# Patient Record
Sex: Male | Born: 1950 | Race: Black or African American | Hispanic: No | Marital: Married | Smoking: Never smoker
Health system: Southern US, Community
[De-identification: ages and names within clinical notes are randomized; demographics above are authoritative.]

## PROBLEM LIST (undated history)

## (undated) DIAGNOSIS — Z86010 Personal history of colonic polyps: Principal | ICD-10-CM

## (undated) DIAGNOSIS — E119 Type 2 diabetes mellitus without complications: Secondary | ICD-10-CM

## (undated) DIAGNOSIS — E785 Hyperlipidemia, unspecified: Secondary | ICD-10-CM

## (undated) DIAGNOSIS — N529 Male erectile dysfunction, unspecified: Secondary | ICD-10-CM

## (undated) DIAGNOSIS — R7309 Other abnormal glucose: Secondary | ICD-10-CM

## (undated) DIAGNOSIS — I10 Essential (primary) hypertension: Secondary | ICD-10-CM

## (undated) HISTORY — DX: Hyperlipidemia, unspecified: E78.5

## (undated) HISTORY — DX: Other abnormal glucose: R73.09

## (undated) HISTORY — DX: Type 2 diabetes mellitus without complications: E11.9

## (undated) HISTORY — DX: Male erectile dysfunction, unspecified: N52.9

## (undated) HISTORY — DX: Essential (primary) hypertension: I10

## (undated) HISTORY — PX: COLONOSCOPY W/ POLYPECTOMY: SHX1380

## (undated) HISTORY — DX: Personal history of colonic polyps: Z86.010

---

## 2001-03-08 ENCOUNTER — Encounter: Payer: Self-pay | Admitting: Internal Medicine

## 2001-03-08 ENCOUNTER — Ambulatory Visit (HOSPITAL_COMMUNITY): Admission: RE | Admit: 2001-03-08 | Discharge: 2001-03-08 | Payer: Self-pay | Admitting: *Deleted

## 2001-03-08 ENCOUNTER — Encounter (INDEPENDENT_AMBULATORY_CARE_PROVIDER_SITE_OTHER): Payer: Self-pay | Admitting: Specialist

## 2001-03-08 DIAGNOSIS — Z860100 Personal history of colon polyps, unspecified: Secondary | ICD-10-CM

## 2001-03-08 HISTORY — DX: Personal history of colon polyps, unspecified: Z86.0100

## 2001-03-08 HISTORY — DX: Personal history of colonic polyps: Z86.010

## 2001-06-04 ENCOUNTER — Encounter: Admission: RE | Admit: 2001-06-04 | Discharge: 2001-06-04 | Payer: Self-pay | Admitting: *Deleted

## 2001-06-04 ENCOUNTER — Encounter: Payer: Self-pay | Admitting: *Deleted

## 2004-10-25 ENCOUNTER — Ambulatory Visit: Payer: Self-pay | Admitting: Internal Medicine

## 2004-11-08 ENCOUNTER — Encounter (INDEPENDENT_AMBULATORY_CARE_PROVIDER_SITE_OTHER): Payer: Self-pay | Admitting: *Deleted

## 2004-11-08 ENCOUNTER — Ambulatory Visit: Payer: Self-pay | Admitting: Internal Medicine

## 2005-01-04 ENCOUNTER — Ambulatory Visit: Payer: Self-pay | Admitting: Internal Medicine

## 2006-01-15 ENCOUNTER — Ambulatory Visit: Payer: Self-pay | Admitting: Internal Medicine

## 2006-01-17 ENCOUNTER — Ambulatory Visit: Payer: Self-pay | Admitting: Internal Medicine

## 2006-01-22 ENCOUNTER — Ambulatory Visit: Payer: Self-pay | Admitting: Internal Medicine

## 2006-01-24 ENCOUNTER — Ambulatory Visit: Payer: Self-pay | Admitting: Internal Medicine

## 2006-01-26 ENCOUNTER — Ambulatory Visit (HOSPITAL_COMMUNITY): Admission: RE | Admit: 2006-01-26 | Discharge: 2006-01-26 | Payer: Self-pay | Admitting: Internal Medicine

## 2006-02-28 ENCOUNTER — Ambulatory Visit: Payer: Self-pay | Admitting: Internal Medicine

## 2006-03-16 ENCOUNTER — Ambulatory Visit: Payer: Self-pay | Admitting: Internal Medicine

## 2006-07-05 ENCOUNTER — Ambulatory Visit: Payer: Self-pay | Admitting: Internal Medicine

## 2006-07-05 LAB — CONVERTED CEMR LAB
BUN: 9 mg/dL (ref 6–23)
CO2: 31 meq/L (ref 19–32)
Calcium: 9.3 mg/dL (ref 8.4–10.5)
Chloride: 104 meq/L (ref 96–112)
Creatinine, Ser: 1.3 mg/dL (ref 0.4–1.5)
Creatinine,U: 286.9 mg/dL
GFR calc Af Amer: 73 mL/min
GFR calc non Af Amer: 60 mL/min
Glucose, Bld: 85 mg/dL (ref 70–99)
Hgb A1c MFr Bld: 6.1 % — ABNORMAL HIGH (ref 4.6–6.0)
Microalb Creat Ratio: 2.1 mg/g (ref 0.0–30.0)
Microalb, Ur: 0.6 mg/dL (ref 0.0–1.9)
Potassium: 4.3 meq/L (ref 3.5–5.1)
Sodium: 141 meq/L (ref 135–145)

## 2006-10-19 ENCOUNTER — Encounter: Payer: Self-pay | Admitting: Internal Medicine

## 2007-04-17 ENCOUNTER — Encounter: Payer: Self-pay | Admitting: Internal Medicine

## 2007-11-07 ENCOUNTER — Encounter: Payer: Self-pay | Admitting: Internal Medicine

## 2007-11-29 ENCOUNTER — Telehealth: Payer: Self-pay | Admitting: Internal Medicine

## 2007-12-19 ENCOUNTER — Ambulatory Visit: Payer: Self-pay | Admitting: Internal Medicine

## 2007-12-19 DIAGNOSIS — I10 Essential (primary) hypertension: Secondary | ICD-10-CM

## 2007-12-19 DIAGNOSIS — N182 Chronic kidney disease, stage 2 (mild): Secondary | ICD-10-CM

## 2007-12-19 DIAGNOSIS — R7309 Other abnormal glucose: Secondary | ICD-10-CM

## 2007-12-26 ENCOUNTER — Ambulatory Visit: Payer: Self-pay | Admitting: Internal Medicine

## 2007-12-26 LAB — CONVERTED CEMR LAB
ALT: 24 units/L (ref 0–53)
AST: 23 units/L (ref 0–37)
Albumin: 3.9 g/dL (ref 3.5–5.2)
Alkaline Phosphatase: 53 units/L (ref 39–117)
BUN: 18 mg/dL (ref 6–23)
Basophils Absolute: 0 10*3/uL (ref 0.0–0.1)
Basophils Relative: 0.5 % (ref 0.0–3.0)
Bilirubin, Direct: 0.2 mg/dL (ref 0.0–0.3)
CO2: 31 meq/L (ref 19–32)
CRP, High Sensitivity: <1 — ABNORMAL LOW (ref 0.00–5.00)
Calcium: 9.2 mg/dL (ref 8.4–10.5)
Chloride: 105 meq/L (ref 96–112)
Cholesterol: 142 mg/dL (ref 0–200)
Creatinine, Ser: 1.4 mg/dL (ref 0.4–1.5)
Eosinophils Absolute: 0.2 10*3/uL (ref 0.0–0.7)
Eosinophils Relative: 2.5 % (ref 0.0–5.0)
GFR calc Af Amer: 67 mL/min
GFR calc non Af Amer: 55 mL/min
Glucose, Bld: 101 mg/dL — ABNORMAL HIGH (ref 70–99)
HCT: 40 % (ref 39.0–52.0)
HDL: 34.8 mg/dL — ABNORMAL LOW (ref >39.0)
Hemoglobin: 13.3 g/dL (ref 13.0–17.0)
Hgb A1c MFr Bld: 6.8 % — ABNORMAL HIGH (ref 4.6–6.0)
LDL Cholesterol: 84 mg/dL (ref 0–99)
Lymphocytes Relative: 44.2 % (ref 12.0–46.0)
MCHC: 33.1 g/dL (ref 30.0–36.0)
MCV: 74.8 fL — ABNORMAL LOW (ref 78.0–100.0)
Monocytes Absolute: 0.6 10*3/uL (ref 0.1–1.0)
Monocytes Relative: 7.6 % (ref 3.0–12.0)
Neutro Abs: 3.8 10*3/uL (ref 1.4–7.7)
Neutrophils Relative %: 45.2 % (ref 43.0–77.0)
PSA: 0.62 ng/mL (ref 0.10–4.00)
Platelets: 230 10*3/uL (ref 150–400)
Potassium: 4.3 meq/L (ref 3.5–5.1)
RBC: 5.34 M/uL (ref 4.22–5.81)
RDW: 13.7 % (ref 11.5–14.6)
Sodium: 139 meq/L (ref 135–145)
TSH: 0.95 microintl units/mL (ref 0.35–5.50)
Total Bilirubin: 1.2 mg/dL (ref 0.3–1.2)
Total CHOL/HDL Ratio: 4.1
Total Protein: 6.9 g/dL (ref 6.0–8.3)
Triglycerides: 117 mg/dL (ref 0–149)
VLDL: 23 mg/dL (ref 0–40)
WBC: 8.5 10*3/uL (ref 4.5–10.5)

## 2008-01-03 ENCOUNTER — Telehealth: Payer: Self-pay | Admitting: Internal Medicine

## 2008-04-22 ENCOUNTER — Telehealth: Payer: Self-pay | Admitting: Internal Medicine

## 2008-05-12 ENCOUNTER — Encounter: Payer: Self-pay | Admitting: Internal Medicine

## 2008-05-13 ENCOUNTER — Telehealth: Payer: Self-pay | Admitting: Internal Medicine

## 2008-08-24 ENCOUNTER — Telehealth: Payer: Self-pay | Admitting: Internal Medicine

## 2008-10-19 ENCOUNTER — Encounter: Payer: Self-pay | Admitting: Internal Medicine

## 2009-05-07 ENCOUNTER — Ambulatory Visit: Payer: Self-pay | Admitting: Internal Medicine

## 2009-05-10 ENCOUNTER — Encounter: Payer: Self-pay | Admitting: Internal Medicine

## 2009-05-21 ENCOUNTER — Encounter: Payer: Self-pay | Admitting: Internal Medicine

## 2009-06-18 ENCOUNTER — Ambulatory Visit: Payer: Self-pay | Admitting: Internal Medicine

## 2009-06-18 DIAGNOSIS — E785 Hyperlipidemia, unspecified: Secondary | ICD-10-CM

## 2009-07-19 ENCOUNTER — Telehealth: Payer: Self-pay | Admitting: Internal Medicine

## 2009-09-06 ENCOUNTER — Telehealth: Payer: Self-pay | Admitting: Internal Medicine

## 2009-10-15 ENCOUNTER — Encounter (INDEPENDENT_AMBULATORY_CARE_PROVIDER_SITE_OTHER): Payer: Self-pay | Admitting: *Deleted

## 2009-12-10 ENCOUNTER — Telehealth (INDEPENDENT_AMBULATORY_CARE_PROVIDER_SITE_OTHER): Payer: Self-pay | Admitting: *Deleted

## 2009-12-10 ENCOUNTER — Ambulatory Visit: Payer: Self-pay | Admitting: Internal Medicine

## 2009-12-10 LAB — CONVERTED CEMR LAB
ALT: 23 units/L (ref 0–53)
AST: 22 units/L (ref 0–37)
BUN: 15 mg/dL (ref 6–23)
CO2: 29 meq/L (ref 19–32)
Calcium: 9.5 mg/dL (ref 8.4–10.5)
Chloride: 109 meq/L (ref 96–112)
Cholesterol: 112 mg/dL (ref 0–200)
Creatinine, Ser: 1.4 mg/dL (ref 0.4–1.5)
GFR calc non Af Amer: 69.07 mL/min (ref >60)
Glucose, Bld: 94 mg/dL (ref 70–99)
HDL: 44.6 mg/dL (ref >39.00)
LDL Cholesterol: 60 mg/dL (ref 0–99)
PSA: 0.55 ng/mL (ref 0.10–4.00)
Potassium: 5.1 meq/L (ref 3.5–5.1)
Sodium: 144 meq/L (ref 135–145)
Total CHOL/HDL Ratio: 3
Triglycerides: 37 mg/dL (ref 0.0–149.0)
VLDL: 7.4 mg/dL (ref 0.0–40.0)

## 2009-12-17 ENCOUNTER — Ambulatory Visit: Payer: Self-pay | Admitting: Internal Medicine

## 2009-12-17 ENCOUNTER — Telehealth: Payer: Self-pay | Admitting: Internal Medicine

## 2009-12-28 ENCOUNTER — Encounter: Payer: Self-pay | Admitting: Internal Medicine

## 2010-01-11 ENCOUNTER — Telehealth: Payer: Self-pay | Admitting: Internal Medicine

## 2010-02-11 ENCOUNTER — Encounter (INDEPENDENT_AMBULATORY_CARE_PROVIDER_SITE_OTHER): Payer: Self-pay | Admitting: *Deleted

## 2010-02-11 ENCOUNTER — Ambulatory Visit: Payer: Self-pay | Admitting: Internal Medicine

## 2010-02-25 ENCOUNTER — Encounter: Payer: Self-pay | Admitting: Internal Medicine

## 2010-03-01 ENCOUNTER — Telehealth: Payer: Self-pay | Admitting: Internal Medicine

## 2010-03-01 ENCOUNTER — Encounter: Payer: Self-pay | Admitting: Internal Medicine

## 2010-03-04 ENCOUNTER — Ambulatory Visit: Payer: Self-pay | Admitting: Internal Medicine

## 2010-03-08 ENCOUNTER — Encounter: Payer: Self-pay | Admitting: Internal Medicine

## 2010-05-29 LAB — CONVERTED CEMR LAB
ALT: 25 units/L (ref 0–53)
AST: 24 units/L (ref 0–37)
Albumin: 4.7 g/dL (ref 3.5–5.2)
Alkaline Phosphatase: 62 units/L (ref 39–117)
BUN: 13 mg/dL (ref 6–23)
Bilirubin, Direct: 0.1 mg/dL (ref 0.0–0.3)
CO2: 23 meq/L (ref 19–32)
Calcium: 9.6 mg/dL (ref 8.4–10.5)
Chloride: 101 meq/L (ref 96–112)
Cholesterol: 164 mg/dL (ref 0–200)
Creatinine, Ser: 1.31 mg/dL (ref 0.40–1.50)
Creatinine, Ser: 1.45 mg/dL
Creatinine, Urine: 308.5 mg/dL
Glucose, Bld: 112 mg/dL — ABNORMAL HIGH (ref 70–99)
HDL: 36 mg/dL — ABNORMAL LOW (ref >39)
Hgb A1c MFr Bld: 6.2 % — ABNORMAL HIGH (ref 4.6–6.1)
Indirect Bilirubin: 0.8 mg/dL (ref 0.0–0.9)
LDL Cholesterol: 102 mg/dL — ABNORMAL HIGH (ref 0–99)
Microalb Creat Ratio: 3 mg/g (ref 0.0–30.0)
Microalb, Ur: 0.94 mg/dL (ref 0.00–1.89)
Potassium: 4.8 meq/L (ref 3.5–5.3)
Sodium: 140 meq/L (ref 135–145)
TSH: 0.426 microintl units/mL (ref 0.350–4.500)
Total Bilirubin: 0.9 mg/dL (ref 0.3–1.2)
Total CHOL/HDL Ratio: 4.6
Total Protein: 7.6 g/dL (ref 6.0–8.3)
Triglycerides: 128 mg/dL (ref <150)
VLDL: 26 mg/dL (ref 0–40)

## 2010-05-31 NOTE — Procedures (Signed)
Summary: Colonoscopy: 1 adenoma, repeat in 5 yrs  Patient: David Cannon Note: All result statuses are Final unless otherwise noted.  Tests: (1) Colonoscopy (COL)   COL Colonoscopy           DONE     [facility]     [address]     [place], [place]  [address]           COLONOSCOPY PROCEDURE REPORT           PATIENT:  David Cannon, David Cannon  MR#:  [id number]     BIRTHDATE:  1950-12-29, 59 yrs. old  GENDER:  male     ENDOSCOPIST:  Iva Boop, MD, [facility]     REF. BY:  Thomos Lemons, DO     PROCEDURE DATE:  03/04/2010     PROCEDURE:  Colonoscopy with biopsy     ASA CLASS:  Class II     INDICATIONS:  surveillance and high-risk screening, history of     pre-cancerous (adenomatous) colon polyps index exam 2002: 5 cm     pedunculated tubulovillous adenoma     2003 and 2006, no adenomas     MEDICATIONS:   Fentanyl 75 mcg IV, Versed 6 mg           DESCRIPTION OF PROCEDURE:   After the risks benefits and     alternatives of the procedure were thoroughly explained, informed     consent was obtained.  Digital rectal exam was performed and     revealed no abnormalities and normal prostate.   The LB 180AL     [id number] endoscope was introduced through the anus and advanced to     the cecum, which was identified by both the appendix and ileocecal     valve, without limitations.  The quality of the prep was     excellent, using MoviPrep.  The instrument was then slowly     withdrawn as the colon was fully examined.     Insertion: 2:44 minutes Withdrawal: 11:09 minutes     <<PROCEDUREIMAGES>>           FINDINGS:  Two polyps were found. They were diminutive.     Appendiceal orifice and hepatic flexure. The polyps were removed     using cold biopsy forceps.  This was otherwise a normal     examination of the colon.   Retroflexed views in the right colon     and rectum revealed no abnormalities.    The scope was then     withdrawn from the patient and the procedure completed.        COMPLICATIONS:  None     ENDOSCOPIC IMPRESSION:     1) Two diminutive (<69mm)  polyps removed     2) Otherwise normal examination with excellent prep     3) Personal history of 5 cm tubulovillous adenoma removal 2002           REPEAT EXAM:  In for Colonoscopy, pending biopsy results.           Iva Boop, MD, [facility]           CC:  Thomos Lemons, DO     The Patient           n.     eSIGNED:   Iva Boop at 03/04/2010 08:37 AM           Marisa Hua, [phone]  Note: An exclamation mark (!) indicates a result that was  not dispersed into the flowsheet. Document Creation Date: 03/04/2010 8:37 AM _______________________________________________________________________  (1) Order result status: Final Collection or observation date-time: 03/04/2010 08:29 Requested date-time:  Receipt date-time:  Reported date-time:  Referring Physician:   Ordering Physician: Stan Head [phone]) Specimen Source:  Source: Launa Grill Order Number: [id number] Lab site:   Appended Document: Colonoscopy   Colonoscopy  Procedure date:  03/04/2010  Findings:          1) Two diminutive (<4mm)  polyps removed     2) Otherwise normal examination with excellent prep     3) Personal history of 5 cm tubulovillous adenoma removal 2002   1. Colon, polyp(s), appendix colon, hepatic flexure :  - TUBULAR ADENOMA (ONE FRAGMENT). - HYPERPLASTIC POLYP AND POLYPOID FRAGMENT OF BENIGN COLONIC MUCOSA). - NO HIGH GRADE DYSPLASIA OR MALIGNANCY.  Comments:      Repeat colonoscopy in 5 years.   Procedures Next Due Date:    Colonoscopy: 03/2015   Appended Document: Colonoscopy: 1 adenoma, repeat in 5 yrs     Procedures Next Due Date:    Colonoscopy: 03/2015

## 2010-05-31 NOTE — Miscellaneous (Signed)
Summary: Moviprep Rx  Clinical Lists Changes  Medications: Added new medication of MOVIPREP 100 GM  SOLR (PEG-KCL-NACL-NASULF-NA ASC-C) As per prep instructions. - Signed Rx of MOVIPREP 100 GM  SOLR (PEG-KCL-NACL-NASULF-NA ASC-C) As per prep instructions.;  #1 x 0;  Signed;  Entered by: Jennye Boroughs RN;  Authorized by: Iva Boop MD, FACG;  Method used: [facility]. [phone]*, [address], [place], [place]  [address], Ph: [phone] or [phone], Fax: [phone]    Prescriptions: MOVIPREP 100 GM  SOLR (PEG-KCL-NACL-NASULF-NA ASC-C) As per prep instructions.  #1 x 0   Entered by:   Jennye Boroughs RN   Authorized by:   Iva Boop MD, [facility]   Signed by:   Jennye Boroughs RN on 03/01/2010   Method used:   [facility] to        [facility]. [phone]* (retail)       [address]       [place], [place]  [address]       Ph: [phone] or [phone]       Fax: [phone]   RxID:   [id number]

## 2010-05-31 NOTE — Procedures (Signed)
Summary: Colonoscopy: Hyperplastic Polyp   Colonoscopy  Procedure date:  11/08/2004  Findings:      Comments: TWO TINY SIGMOID POLYPS REMOVED  ***MICROSCOPIC EXAMINATION AND DIAGNOSIS***  COLON, POLYP(S): HYPERPLASTIC POLYP(S). NO ADENOMATOUS CHANGE OR MALIGNANCY IDENTIFIED. (BIOPSIES, SIGMOID)  Procedures Next Due Date:    Colonoscopy: 10/2009  Patient Name: David Cannon, David Cannon. MRN:  Procedure Procedures: Colonoscopy CPT: [phone].  Personnel: Endoscopist: Iva Boop, MD, [facility].  Referred By: Henrine Screws, MD.  Exam Location: Exam performed in Outpatient Clinic. Outpatient  Patient Consent: Procedure, Alternatives, Risks and Benefits discussed, consent obtained, from patient. Consent was obtained by the RN.  Indications  Surveillance of: Adenomatous Polyp(s). This is an initial surveillance exam. Initial polypectomy was performed in 2002. in Nov. 1-2 Polyps were found at Index Exam. Largest polyp removed was > 19 mm. Pathology of worst  polyp: tubulovillous adenoma.  Increased Risk Screening: For family history of colorectal neoplasia, in  grandparent Family History of Polyps.  Comments: Mother either had colon polyps or cancer History  Current Medications: Patient is not currently taking Coumadin.  Allergies: No known allergies.  Pre-Exam Physical: Performed Nov 08, 2004. Cardio-pulmonary exam, Rectal exam, HEENT exam , Abdominal exam, Mental status exam WNL.  Exam Exam: Extent of exam reached: Cecum, extent intended: Cecum.  The cecum was identified by appendiceal orifice and IC valve. Patient position: on left side. Colon retroflexion performed. Images taken. ASA Classification: I. Tolerance: excellent.  Monitoring: Pulse and BP monitoring, Oximetry used. Supplemental O2 given.  Colon Prep Used MiraLax for colon prep. Prep results: good.  Sedation Meds: Patient assessed and found to be appropriate for moderate (conscious) sedation. Fentanyl 50  mcg. given IV. Versed 6 mg. given IV.  Findings - NORMAL EXAM: Cecum to Sigmoid Colon.  MULTIPLE POLYPS: Sigmoid Colon. minimum size 2 mm, maximum size 3 mm. Procedure:  biopsy without cautery, removed, Polyp retrieved, 2 polyps Polyps sent to pathology. 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  NORMAL EXAM: Rectum.   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   Comments: TWO TINY SIGMOID POLYPS REMOVED Events  Unplanned Interventions: No intervention was required.  Plans Patient Education: Patient given standard instructions for: Polyps.  Disposition: After procedure patient sent to recovery. After recovery patient sent home.  Scheduling/Referral: Colonoscopy, to Iva Boop, MD, [facility], 5 YEARS DUE TO POLYPS AND HISTORY,  Primary Care Provider, to Henrine Screws, MD, AS PLANNED,   CC:   Henrine Screws, MD  This report was created from the original endoscopy report, which was reviewed and signed by the above listed endoscopist.

## 2010-05-31 NOTE — Letter (Signed)
Summary: [facility]  [facility]   Imported By: Lanelle Bal 06/07/2009 12:50:27  _____________________________________________________________________  External Attachment:    Type:   Image     Comment:   External Document

## 2010-05-31 NOTE — Progress Notes (Signed)
Summary: Medication Refill  Phone Note Call from Patient Call back at Work Phone [phone]   Caller: Patient Summary of Call: patient called and left voice message requesting refill on his blood pressure medication. His message did not clarify which medication needed to be filled Initial call taken by: Glendell Docker CMA,  Sep 06, 2009 5:37 PM  Follow-up for Phone Call        Left message on voicemail for pt. to call with name of med to be filled.  Mervin Kung CMA  Sep 07, 2009 10:28 AM   Pt returned my call and verified need for refill on Benicar HCT. Refills sent to pharmacy.  Nicki Guadalajara Fergerson CMA  Sep 07, 2009 12:54 PM     Prescriptions: BENICAR HCT 20-12.5 MG TABS (OLMESARTAN MEDOXOMIL-HCTZ) one by mouth once daily  #90 x 0   Entered by:   Mervin Kung CMA   Authorized by:   D. Thomos Lemons DO   Signed by:   Mervin Kung CMA on 09/07/2009   Method used:   [facility]        [facility]. [phone]* (retail)       [address]       [place], [place]  [address]       Ph: [phone] or [phone]       Fax: [phone]   RxID:   [id number]

## 2010-05-31 NOTE — Letter (Signed)
Summary: [facility] Instructions  [facility]  [address] [place], [place] [address]   Phone: [phone]  Fax: [phone]       LOKI WUTHRICH    [birth year]    MRN: [id number]      Procedure Day Dorna Bloom: Farrell Ours, 03/04/10     Arrival Time: 7:30 AM     Procedure Time: 8:00 AM    Location of Procedure:                    _X_  [facility] (4th Floor)  PREPARATION FOR COLONOSCOPY WITH MOVIPREP   Starting 5 days prior to your procedure 02/28/10 do not eat nuts, seeds, popcorn, corn, beans, peas,  salads, or any raw vegetables.  Do not take any fiber supplements (e.g. Metamucil, Citrucel, and Benefiber).  THE DAY BEFORE YOUR PROCEDURE         THURSDAY, 03/03/10  1.  Drink clear liquids the entire day-NO SOLID FOOD  2.  Do not drink anything colored red or purple.  Avoid juices with pulp.  No orange juice.  3.  Drink at least 64 oz. (8 glasses) of fluid/clear liquids during the day to prevent dehydration and help the prep work efficiently.  CLEAR LIQUIDS INCLUDE: Water Jello Ice Popsicles Tea (sugar ok, no milk/cream) Powdered fruit flavored drinks Coffee (sugar ok, no milk/cream) Gatorade Juice: apple, white grape, white cranberry  Lemonade Clear bullion, consomm, broth Carbonated beverages (any kind) Strained chicken noodle soup Hard Candy                           4.  In the morning, mix first dose of MoviPrep solution:    Empty 1 Pouch A and 1 Pouch B into the disposable container    Add lukewarm drinking water to the top line of the container. Mix to dissolve    Refrigerate (mixed solution should be used within 24 hrs)  5.  Begin drinking the prep at 5:00 p.m. The MoviPrep container is divided by 4 marks.   Every 15 minutes drink the solution down to the next mark (approximately 8 oz) until the full liter is complete.   6.  Follow completed prep with 16 oz of clear liquid of your choice (Nothing red or purple).  Continue to drink clear  liquids.  7.  Mix second dose of MoviPrep solution:    Empty 1 Pouch A and 1 Pouch B into the disposable container    Add lukewarm drinking water to the top line of the container. Mix to dissolve    Refrigerate  Beginning at 9:00 p.m.         1. Every 15 minutes, drink the solution down to the next mark (approx 8 oz) until the full liter is complete.         2. Follow completed prep with 16 oz. of clear liquid of your choice.    THE DAY OF YOUR PROCEDURE      FRIDAY, 03/04/10  1. You may drink clear liquids until 6:00 AM (2 HOURS BEFORE PROCEDURE).  MEDICATION INSTRUCTIONS  Unless otherwise instructed, you should take regular prescription medications with a small sip of water   as early as possible the morning of your procedure.  Diabetic patients - see separate instructions.       OTHER INSTRUCTIONS  You will need a responsible adult at least 62 years of age to accompany you and drive you home.  This person must remain in the waiting room during your procedure.  Wear loose fitting clothing that is easily removed.  Leave jewelry and other valuables at home.  However, you may wish to bring a book to read or  an iPod/MP3 player to listen to music as you wait for your procedure to start.  Remove all body piercing jewelry and leave at home.  Total time from sign-in until discharge is approximately 2-3 hours.  You should go home directly after your procedure and rest.  You can resume normal activities the  day after your procedure.  The day of your procedure you should not:   Drive   Make legal decisions   Operate machinery   Drink alcohol   Return to work  You will receive specific instructions about eating, activities and medications before you leave.  The above instructions have been reviewed and explained to me by   Francee Piccolo, CMA (AAMA)    I fully understand and can verbalize these instructions _____________________________ Date 02/11/10

## 2010-05-31 NOTE — Letter (Signed)
   [place] at [facility] [address] [place], [place]  [address]  [place] Phone: [phone]      May 10, 2009   PENNY FRISBIE [address] [place], [place] [address]  RE:  LAB RESULTS  Dear  Mr. Jaskot,  The following is an interpretation of your most recent lab tests.  Please take note of any instructions provided or changes to medications that have resulted from your lab work.  ELECTROLYTES:  Good - no changes needed  KIDNEY FUNCTION TESTS:  Good - no changes needed  LIVER FUNCTION TESTS:  Good - no changes needed  LIPID PANEL:  Stable - no changes needed Triglyceride: 128   Cholesterol: 164   LDL: 102   HDL: 36   Chol/HDL%:  4.6 Ratio  THYROID STUDIES:  Thyroid studies normal TSH: 0.426     DIABETIC STUDIES:  Good - no changes needed Blood Glucose: 112   HgbA1C: 6.2   Microalbumin/Creatinine Ratio: 3.0          Sincerely Yours,    Dr. Thomos Lemons

## 2010-05-31 NOTE — Progress Notes (Signed)
Summary: prep   Phone Note Call from Patient Call back at Home Phone [phone]   Caller: mother, Corrie Dandy Call For: Dr. Leone Payor Reason for Call: Talk to Nurse Summary of Call: prep not at pharmacy... [facility] [address] Initial call taken by: Vallarie Mare,  March 01, 2010 11:37 AM  Follow-up for Phone Call        [facility] [address]  Phoned pts. mother and she is aware. Follow-up by: Jennye Boroughs RN,  March 01, 2010 12:17 PM

## 2010-05-31 NOTE — Miscellaneous (Signed)
Summary: Eye Exam  Clinical Lists Changes  Observations: Added new observation of DMEYEEXAMNXT: 09/2009 (12/28/2009 8:31) Added new observation of DMEYEEXMRES: normal (10/13/2008 8:46) Added new observation of EYE EXAM BY: [facility] [phone] (10/13/2008 8:46) Added new observation of DIAB EYE EX: normal (10/13/2008 8:46)       Diabetes Management Exam:    Eye Exam:       Eye Exam done elsewhere          Date: 10/13/2008          Results: normal          Done by: [facility] [phone]

## 2010-05-31 NOTE — Letter (Signed)
Summary: Diabetic Instructions  [facility]  [address] [place], [place] [address]   Phone: [phone]  Fax: [phone]    DELMORE SEAR Feb 27, 1951 MRN: [id number]   _x_   ORAL DIABETIC MEDICATION INSTRUCTIONS  The day before your procedure:   Take your diabetic pill as you do normally  The day of your procedure:   Do not take your diabetic pill    We will check your blood sugar levels during the admission process and again in Recovery before discharging you home  ________________________________________________________________________

## 2010-05-31 NOTE — Progress Notes (Signed)
  Phone Note Other Incoming   Request: Send information Summary of Call: Request for records received from [facility]. Request forwarded to [facility].     

## 2010-05-31 NOTE — Assessment & Plan Note (Signed)
Summary: RECEIVED RECALL LETTER..LSW.    History of Present Illness Visit Type: Initial Visit Primary GI MD: Stan Head MD [facility] Primary Provider: Dondra Spry DO Chief Complaint: colonoscopy screening History of Present Illness:   60 yo African-American man with hx of adenomatous colon polyps.    GI Review of Systems      Denies abdominal pain, acid reflux, belching, bloating, chest pain, dysphagia with liquids, dysphagia with solids, heartburn, loss of appetite, nausea, vomiting, vomiting blood, weight loss, and  weight gain.        Denies anal fissure, black tarry stools, change in bowel habit, constipation, diarrhea, diverticulosis, fecal incontinence, heme positive stool, hemorrhoids, irritable bowel syndrome, jaundice, light color stool, liver problems, rectal bleeding, and  rectal pain. Preventive Screening-Counseling & Management      Drug Use:  no.      Clinical Reports Reviewed:  Colonoscopy:  11/08/2004:  Comments: TWO TINY SIGMOID POLYPS REMOVED  ***MICROSCOPIC EXAMINATION AND DIAGNOSIS***  COLON, POLYP(S): HYPERPLASTIC POLYP(S). NO ADENOMATOUS CHANGE OR MALIGNANCY IDENTIFIED. (BIOPSIES, SIGMOID)  05/24/2001:  Done  03/08/2001:  POLYP: Ascending Colon, Maximum size: 50 mm. pedunculated polyp.  ***MICROSCOPIC EXAMINATION AND DIAGNOSIS***    COLON, ASCENDING POLYP: TUBULOVILLOUS ADENOMA. STALK MARGIN NOT INVOLVED BY ADENOMA. NO HIGH GRADE DYSPLASIA OR MALIGNANCY IDENTIFIED.   Current Medications (verified): 1)  Benicar Hct 20-12.5 Mg Tabs (Olmesartan Medoxomil-Hctz) .... One By Mouth Once Daily 2)  Viagra 100 Mg Tabs (Sildenafil Citrate) .... 1/2-1 Tablet By Mouth As Needed 3)  Actos 30 Mg Tabs (Pioglitazone Hcl) .... One By Mouth Once Daily 4)  Accu-Chek Aviva  Strp (Glucose Blood) .... Use Once Daily 5)  Accu-Chek Multiclix Lancets  Misc (Lancets) .... Use As Directed 6)  Simvastatin 10 Mg Tabs (Simvastatin) .... One By Mouth Qpm 7)  Zostavax 84166  Unt/0.70ml Solr (Zoster Vaccine Live) .... Administer Vaccine X 1 8)  Aspirin 81 Mg Tbec (Aspirin) .... One By Mouth Once Daily  Allergies (verified): No Known Drug Allergies  Past History:  Past Medical History: Reviewed history from 12/17/2009 and no changes required. CRI Hypertension  Diabetes Mellitus II    Past Surgical History: Reviewed history from 06/18/2009 and no changes required. colonoscopy 11/08/04 - two tiny sigmoid polyps (hyperplastic) colonoscopy 2002 - tubulovillious adenoma     Family History: Prostate ca - F (died age 64) Lung ca - M CAD - no  Colon ca - MGF     Social History: Reviewed history from 12/17/2009 and no changes required. Occupation:  Radiographer, therapeutic - call center Married 2 children Never Smoked   Alcohol use-no (social)   Illicit Drug Use - no Drug Use:  no  Vital Signs:  Patient profile:   60 year old male Height:      73.5 inches Weight:      218 pounds BMI:     28.47 Pulse rate:   72 / minute Pulse rhythm:   regular BP sitting:   110 / 74  (left arm)  Vitals Entered By: Milford Cage NCMA (February 11, 2010 1:58 PM)  Physical Exam  General:  Well developed, well nourished, no acute distress. Lungs:  Clear throughout to auscultation. Heart:  Regular rate and rhythm; no murmurs, rubs,  or bruits.   Impression & Recommendations:  Problem # 1:  PERSONAL HX COLONIC POLYPS (ICD-V12.72) Assessment Unchanged 5 cm TV adenoma 2002 (Santogade) no adenomas 2006 due for surveillance/screening Orders: Colonoscopy (Colon)  Problem # 2:  SCREENING COLORECTAL-CANCER (ICD-V76.51) Assessment: Unchanged  Orders: Colonoscopy (  Colon)  Patient Instructions: 1)  Please pick up your medications at your pharmacy.  2)  We will see you at your procedure on 03/04/10. 3)  [facility] Patient Information Guide given to patient.  4)  Colonoscopy and Flexible Sigmoidoscopy brochure given.  5)  The medication list was reviewed and  reconciled.  All changed / newly prescribed medications were explained.  A complete medication list was provided to the patient / caregiver. Prescriptions: MOVIPREP 100 GM  SOLR (PEG-KCL-NACL-NASULF-NA ASC-C) As per prep instructions.  #1 x 0   Entered by:   Francee Piccolo CMA (AAMA)   Authorized by:   Iva Boop MD, [facility]   Signed by:   Francee Piccolo CMA (AAMA) on 02/11/2010   Method used:   [facility] [phone]* (retail)       [address]       [place], [place]  [address]       Ph: [phone] or [phone]       Fax: [phone]   RxID:   [id number]

## 2010-05-31 NOTE — Letter (Signed)
Summary: [facility]  [facility]   Imported By: Lanelle Bal 03/18/2010 11:13:31  _____________________________________________________________________  External Attachment:    Type:   Image     Comment:   External Document

## 2010-05-31 NOTE — Procedures (Signed)
Summary: Colonoscopy: Santogade: Adenoma   Colonoscopy  Procedure date:  03/08/2001  Findings:      POLYP: Ascending Colon, Maximum size: 50 mm. pedunculated polyp.  ***MICROSCOPIC EXAMINATION AND DIAGNOSIS***    COLON, ASCENDING POLYP: TUBULOVILLOUS ADENOMA. STALK MARGIN NOT INVOLVED BY ADENOMA. NO HIGH GRADE DYSPLASIA OR MALIGNANCY IDENTIFIED.  Patient Name: David Cannon, David Cannon. MRN:  Procedure Procedures: Colonoscopy CPT: [phone].    with polypectomy. CPT: [id number].  Personnel: Endoscopist: Roosvelt Harps, MD.  Referred By: Henrine Screws, MD.  Exam Location: Exam performed in Endoscopy Suite. Outpatient  Patient Consent: Procedure, Alternatives, Risks and Benefits discussed, consent obtained, from patient. Consent to be contacted was not given.  Indications  Evaluation of: Positive fecal occult blood test per home screening.  Surveillance of: Adenomatous Polyp(s).  History Allergies: No known allergies.  Patient Habits Patient does not smoke. Drinking Status: not currently drinking.  Pre-Exam Physical: Cardio-pulmonary exam, Rectal exam, HEENT exam , Abdominal exam, Extremity exam, Neurological exam, Mental status exam WNL.  Exam Exam: Extent of exam reached: Ascending Colon, extent intended: Cecum.  Exam incomplete due to tortuosity. Patient position: left side to back. Colon retroflexion not performed. Images taken. ASA Classification: I. Tolerance: good.  Monitoring: Pulse and BP monitoring, Oximetry used. Supplemental O2 given.  Colon Prep Used Phospho Soda for colon prep. Prep results: excellent.  Fluoroscopy: Fluoroscopy was not used.  Sedation Meds: Patient assessed and found to be appropriate for moderate (conscious) sedation. Sedation was managed by the Endoscopist. Demerol 80 mg. Versed 8 mg.  Findings IMAGE TAKEN: Descending Colon.  Image #1 attached.  Comments:  Normal.  POLYP: Ascending Colon, Maximum size: 50 mm. pedunculated  polyp. Procedure:  snare with cautery, removed, retrieved, Polyp sent to pathology. ICD9: Colon Polyps: 211.3. Comments: Image #2 & 4: pre and post removal.  - Injection: Ascending Colon. for bleeding post polypectomy. Injected with Epinephrine 1:10000, 10 ccs. Outcome: successful.  - Bicap/Coagulation: Ascending Colon. Outcome: successful.   Assessment Abnormal examination, see findings above.  Diagnoses: 211.3: Colon Polyps.   Events  Unplanned Interventions: Bleeding site was cauterized.  Unplanned Events: The patient had the following complications:  Bleeding.  Plans  Post Exam Instructions: Post sedation instructions given.  Medication Plan: Referring provider to order medications.  Patient Education: Patient given standard instructions for: Polyps.  Disposition: After procedure patient sent to recovery. After recovery patient sent home.  Scheduling/Referral: Clinic Visit, to Roosvelt Harps, MD, around Mar 13, 2001.   CC:   Henrine Screws, MD  This report was created from the original endoscopy report, which was reviewed and signed by the above listed endoscopist.

## 2010-05-31 NOTE — Progress Notes (Signed)
Summary: diet questions  Phone Note Call from Patient   Caller: Spouse Call For: D. Thomos Lemons DO Summary of Call: Pt's wife called concerned about the diet that her husband has been placed on. She states he was told to cut out starches, avoid sugar and salt. She states that pt has lost 15 pounds. She has been following the diet along with her husband and has lost 15 pounds as well. Her physician recently advised her not to follow this diet.  She feels if she is not following this diet then the pt may try to eat what she brings home. Is this diet ok for pt. to continue? Can he have some "white foods"?   Should he see a nutritionist?   Please advise.  Nicki Guadalajara Fergerson CMA Duncan Dull)  January 11, 2010 3:17 PM   Follow-up for Phone Call        pt was advised to follow low carb diet , not no carb diet.   I suggest pt limit carb intake to 25 grams per meal.  Follow-up by: D. Thomos Lemons DO,  January 11, 2010 6:17 PM  Additional Follow-up for Phone Call Additional follow up Details #1::        Advised pt's wife per Dr Olegario Messier instruction and she voice understanding. Nicki Guadalajara Fergerson CMA Duncan Dull)  January 12, 2010 9:07 AM

## 2010-05-31 NOTE — Assessment & Plan Note (Signed)
Summary: 6 MONTH FOLLOW UP/MHF   Vital Signs:  Patient profile:   60 year old male Weight:      214.75 pounds BMI:     29.23 O2 Sat:      98 % on Room air Temp:     98.4 degrees F oral Pulse rate:   59 / minute Pulse rhythm:   regular Resp:     16 per minute BP sitting:   100 / 70  (right arm) Cuff size:   large  Vitals Entered By: Glendell Docker CMA (December 17, 2009 9:23 AM)  O2 Flow:  Room air CC: 6 month followup , Type 2 diabetes mellitus follow-up Is Patient Diabetic? Yes Pain Assessment Patient in pain? no      Comments low blood sugar 89 high 116 avg 100, no concerns,questions about insurance policy   Primary Care Provider:  D. Thomos Lemons DO  CC:  6 month followup  and Type 2 diabetes mellitus follow-up.  History of Present Illness:  Type 2 Diabetes Mellitus Follow-Up      This is a 60 year old man who presents for Type 2 diabetes mellitus follow-up.  The patient denies weight gain.  The patient denies the following symptoms: chest pain.  Since the last visit the patient reports good dietary compliance and compliance with medications.  exercises occ saw optometrist in Jan or Feb.  no diabetic retionopathy  hyperlipidemia - no side effects from simvastatin.  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    Physical Exam  General:  alert, well-developed, and well-nourished.   Neck:  No deformities, masses, or tenderness noted.no  carotid bruits.   Lungs:  normal respiratory effort and normal breath sounds.   Heart:  normal rate, regular rhythm, and no gallop.   Extremities:  No lower extremity edema Neurologic:  cranial nerves II-XII intact and gait normal.     Impression & Recommendations:  Problem # 1:  HYPERLIPIDEMIA, MILD (ICD-272.4) Assessment Improved  His updated medication list for this problem includes:    Simvastatin 10 Mg Tabs (Simvastatin) ..... One by mouth qpm  Labs Reviewed: SGOT: 22 (12/10/2009)   SGPT: 23 (12/10/2009)   HDL:44.60 (12/10/2009), 36 (05/07/2009)  LDL:60 (12/10/2009), 102 (96/07/5407)  Chol:112 (12/10/2009), 164 (05/07/2009)  Trig:37.0 (12/10/2009), 128 (05/07/2009)  Problem # 2:  DIABETES MELLITUS, TYPE II (ICD-250.00) Assessment: Unchanged  His updated medication list for this problem includes:    Benicar Hct 20-12.5 Mg Tabs (Olmesartan medoxomil-hctz) ..... One by mouth once daily    Actos 30 Mg Tabs (Pioglitazone hcl) ..... One by mouth once daily    Aspirin 81 Mg Tbec (Aspirin) ..... One by mouth once daily  Labs Reviewed: Creat: 1.4 (12/10/2009)    Reviewed HgBA1c results: 6.2 (05/07/2009)  6.8 (12/26/2007)  Problem # 3:  HYPERTENSION (ICD-401.9) Assessment: Unchanged  His updated medication list for this problem includes:  Benicar Hct 20-12.5 Mg Tabs (Olmesartan medoxomil-hctz) ..... One by mouth once daily  BP today: 100/70 Prior BP: 110/70 (06/18/2009)  Labs Reviewed: K+: 5.1 (12/10/2009) Creat: : 1.4 (12/10/2009)   Chol: 112 (12/10/2009)   HDL: 44.60 (12/10/2009)   LDL: 60 (12/10/2009)   TG: 37.0 (12/10/2009)  Complete Medication List: 1)  Benicar Hct 20-12.5 Mg Tabs (Olmesartan medoxomil-hctz) .... One by mouth once daily 2)  Viagra 100 Mg Tabs (Sildenafil citrate) .... 1/2-1 tablet by mouth as needed 3)  Actos 30 Mg Tabs (Pioglitazone hcl) .... One by mouth once daily 4)  Accu-chek Aviva Strp (Glucose blood) .... Use once daily 5)  Accu-chek  Multiclix Lancets Misc (Lancets) .... Use as directed 6)  Simvastatin 10 Mg Tabs (Simvastatin) .... One by mouth qpm 7)  Zostavax 57846 Unt/0.52ml Solr (Zoster vaccine live) .... Administer vaccine x 1 8)  Aspirin 81 Mg Tbec (Aspirin) .... One by mouth once daily  Patient Instructions: 1)  Please schedule a follow-up appointment in 6 months. 2)  Monitor your blood pressure at home. 3)  If SBP < 100, take 1/2 of Benicar/Hctz 4)  BMP prior to visit, ICD-9:  401.9 5)  HbgA1C prior to visit, ICD-9:  250.00 6)  Urine Microalbumin prior to visit, ICD-9: 250.00 7)  Please return for lab work one (1) week before your next appointment.  Prescriptions: ACCU-CHEK MULTICLIX LANCETS  MISC (LANCETS) use as directed  #100 x 1   Entered and Authorized by:   D. Thomos Lemons DO   Signed by:   D. Thomos Lemons DO on 12/17/2009   Method used:   Electronically to        [facility] [phone]* (retail)       [address]       [place], [place]  [address]       Ph: [phone] or [phone]       Fax: [phone]   RxID:   [id number] ACCU-CHEK AVIVA  STRP (GLUCOSE BLOOD) use once daily  #100 x 1   Entered and Authorized by:   D. Thomos Lemons DO   Signed by:   D. Thomos Lemons DO on 12/17/2009   Method used:   Electronically to        [facility] [phone]* (retail)       [address], [place]  [address]       Ph: [phone] or [phone]       Fax: [phone]   RxID:   [id number] SIMVASTATIN 10 MG TABS (SIMVASTATIN) one by mouth qpm  #90 x 1   Entered and Authorized by:   D. Thomos Lemons DO   Signed by:   D. Thomos Lemons DO on 12/17/2009   Method used:   Electronically to        [facility] [phone]* (retail)       [address] [place]  [address]       Ph: [phone] or [phone]       Fax: [phone]   RxID:   [id number] ACTOS 30 MG TABS (PIOGLITAZONE HCL) one by mouth once daily  #90 x 1   Entered and Authorized by:   D. Thomos Lemons DO   Signed by:    D. Thomos Lemons DO on 12/17/2009   Method used:   [facility]        [facility] [place] [phone][facility] (retail)       [phone] [place]  [address]       Ph: [phone] or [phone]       Fax: [phone]   RxID:   [id number] BENICAR HCT 20-12.5 MG TABS (OLMESARTAN MEDOXOMIL-HCTZ) one by mouth once daily  #90 x 1   Entered and Authorized by:   D. Thomos Lemons DO   Signed by:   D. Thomos Lemons DO on 12/17/2009   Method used:   [facility]        [facility] [phone]* (retail)       [address]       [place], [place]  [address]       Ph: [phone] or [phone]       Fax: [phone]   RxID:   [id number]   Current Allergies (reviewed today): No known allergies

## 2010-05-31 NOTE — Letter (Signed)
Summary: Colonoscopy Letter  [facility]  [address] [place], [place] [address]   Phone: [phone]  Fax: [phone]      October 15, 2009 MRN: [id number]   David Cannon [address] [place], [place]  [address]   Dear Mr. Channing,   According to your medical record, it is time for you to schedule a Colonoscopy. The American Cancer Society recommends this procedure as a method to detect early colon cancer. Patients with a family history of colon cancer, or a personal history of colon polyps or inflammatory bowel disease are at increased risk.  This letter has beeen generated based on the recommendations made at the time of your procedure. If you feel that in your particular situation this may no longer apply, please contact our office.  Please call our office at [phone] to schedule this appointment or to update your records at your earliest convenience.  Thank you for cooperating with [place] to provide you with the very best care possible.   Sincerely,    Iva Boop, M.D.  [facility] [phone]

## 2010-05-31 NOTE — Assessment & Plan Note (Signed)
Summary: David Cannon   Vital Signs:  Patient profile:   60 year old male Height:      72 inches Weight:      226.50 pounds BMI:     30.83 O2 Sat:      98 % on Room air Temp:     98.6 degrees F oral Pulse rate:   68 / minute Pulse rhythm:   regular Resp:     16 per minute BP sitting:   97 / 60  (right arm) Cuff size:   large  Vitals Entered By: Glendell Docker CMA (May 07, 2009 8:37 AM)  O2 Flow:  Room air  Primary Care Provider:  D. Thomos Lemons DO  CC:  CPX.  History of Present Illness: CPX  60 y/o male with hx of htn and chronic renal insuff for routine CPX.  no significant interval hx.  He has not been taking actos on regular basis.    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    Physical Exam  General:  alert, well-developed, and well-nourished.   Head:  normocephalic and atraumatic.   Ears:  R ear normal and L ear normal.   Mouth:  Oral mucosa and oropharynx without lesions or exudates.  Teeth in good repair. Neck:  No deformities, masses, or tenderness noted.no carotid bruits.     Lungs:  normal respiratory effort, normal breath sounds, no crackles, and no wheezes.   Heart:  normal rate, regular rhythm, no murmur, and no gallop.   Abdomen:  soft, non-tender, normal bowel sounds, no masses, no hepatomegaly, and no splenomegaly.   Extremities:  No lower extremity edema  Neurologic:  cranial nerves II-XII intact and gait normal.   Psych:  normally interactive, good eye contact, not anxious appearing, and not depressed appearing.    Diabetes Management Exam:    Foot Exam (with socks and/or shoes not present):       Inspection:          Left foot: normal          Right foot: normal   Impression & Recommendations:  Problem # 1:  HEALTH MAINTENANCE EXAM (ICD-V70.0)  Reviewed adult health maintenance protocols.  Colonoscopy: Done (05/24/2001) Td Booster: Tdap (12/19/2007)   Flu Vax: given (03/18/2007)   Chol: 142 (12/26/2007)   HDL: 34.8 (12/26/2007)   LDL: 84 (12/26/2007)   TG: 117 (12/26/2007) TSH: 0.95 (12/26/2007)   HgbA1C: 6.8 (12/26/2007)   PSA: 0.62 (12/26/2007)  Orders: EKG w/ Interpretation (93[address])  Problem # 2:  HYPERTENSION (ICD-401.9) Change avalide to Benicar.  Cr follwed by  nephrologist.  His updated medication list for this problem includes:    Benicar Hct 20-12.5 Mg Tabs (Olmesartan medoxomil-hctz) ..... One by mouth once daily  Orders: T-TSH ([address])  BP today: 97/60 Prior BP: 124/78 (12/19/2007)  Labs Reviewed: K+: 4.3 (12/26/2007) Creat: : 1.4 (12/26/2007)   Chol: 142 (12/26/2007)   HDL: 34.8 (12/26/2007)   LDL: 84 (12/26/2007)   TG: 117 (12/26/2007)  Problem # 3:  DIABETES MELLITUS, TYPE II, BORDERLINE (ICD-790.29) restart Actos.  The following medications were removed from the medication list:    Actos 15 Mg Tabs (Pioglitazone hcl) .Marland Kitchen... Take 1 tablet by mouth once a day His updated medication list for this problem includes:    Actos 30 Mg Tabs (Pioglitazone hcl) ..... 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  last Cr.   1.45  05/12/08  Complete Medication List: 1)  Benicar Hct 20-12.5 Mg Tabs (Olmesartan medoxomil-hctz) .... One by mouth once daily 2)  Viagra 100 Mg Tabs (Sildenafil citrate) .... 1/2-1 tablet by mouth as needed 3)  Actos 30 Mg Tabs (Pioglitazone hcl) .... One by mouth once daily 4)  Accu-chek Aviva Strp (Glucose blood) .... Use once daily 5)  Accu-chek Multiclix Lancets Misc (Lancets) .... Use as directed  Other Orders: T-Basic Metabolic Panel [id number]) T-Lipid Profile [id number]) T-Hepatic Function [phone]) T- Hemoglobin A1C ([address]-24401) T-Urine Microalbumin w/creat. ratio [id number]) Influenza Vaccine NON MCR (42595) Admin 1st Vaccine ([address])  Patient Instructions: 1)  Please schedule a follow-up appointment in 3 months. Prescriptions: ACCU-CHEK MULTICLIX LANCETS  MISC (LANCETS) use as directed  #100 x 1   Entered and Authorized by:   D. Thomos Lemons DO   Signed by:   D. Thomos Lemons DO on 05/07/2009   Method used:   Print then Give to Patient   RxID:   [id number] ACCU-CHEK AVIVA  STRP (GLUCOSE BLOOD) use once daily  #100 x 1   Entered and Authorized by:   D. Thomos Lemons DO   Signed by:   D. Thomos Lemons DO on 05/07/2009   Method used:   Print then Give to Patient   RxID:   [id number] ACTOS 30 MG TABS (PIOGLITAZONE HCL) one by mouth once daily  #90 x 1   Entered and Authorized by:   D. Thomos Lemons DO   Signed by:   D. Thomos Lemons DO on 05/07/2009   Method used:   Print then Give to Patient   RxID:   [id number] BENICAR HCT 20-12.5 MG TABS (OLMESARTAN MEDOXOMIL-HCTZ) one by mouth once daily  #90 x 1   Entered and Authorized by:   D. Thomos Lemons DO   Signed by:   D. Thomos Lemons DO on 05/07/2009   Method used:   Print then Give to Patient   RxID:   [id number]    Immunizations Administered:  Influenza Vaccine # 1:    Vaccine Type: Fluvax  Non-MCR    Site: left deltoid    Mfr: GlaxoSmithKline    Dose: 0.5 ml    Route: IM    Given by: Glendell Docker CMA    Exp. Date: 10/28/2009    Lot #: [id number]    VIS given: 12/08/2008  Flu Vaccine Consent Questions:    Do you have a history of severe allergic reactions to this vaccine? no    Any prior history of allergic reactions to egg and/or gelatin? no  Do you have a sensitivity to the preservative Thimersol? no    Do you have a past history of Guillan-Barre Syndrome? no    Do you currently have an acute febrile illness? no    Have you ever had a severe reaction to latex? no    Vaccine information given and explained to patient? yes     Current Allergies (reviewed today): No known allergies

## 2010-05-31 NOTE — Assessment & Plan Note (Signed)
Summary: 1 MONTH FOLLOW UP/[facility] WITH PT /MHF   Vital Signs:  Patient profile:   60 year old male Weight:      218.25 pounds BMI:     29.71 O2 Sat:      99 % on Room air Temp:     98.1 degrees F oral Pulse rate:   68 / minute Pulse rhythm:   regular BP sitting:   110 / 70  (right arm) Cuff size:   large  Vitals Entered By: Glendell Docker CMA (June 18, 2009 8:44 AM)  O2 Flow:  Room air  Primary Care Provider:  D. Thomos Lemons DO  CC:  1 Month Follow up and Type 2 diabetes mellitus follow-up.  History of Present Illness: 1 month folllow up  Type 2 Diabetes Mellitus Follow-Up      This is a 60 year old man who presents for Type 2 diabetes mellitus follow-up.  The patient denies weight gain.  The patient denies the following symptoms: chest pain.  Since the last visit the patient reports good dietary compliance and monitoring blood glucose.  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   Neck:  No deformities, masses, or tenderness noted.no carotid bruits.   Lungs:  normal respiratory effort, normal breath sounds, no crackles, and no wheezes.   Heart:  normal rate, regular rhythm, no murmur, and no gallop.   Extremities:  trace left pedal edema and trace right pedal edema.     Impression & Recommendations:  Problem # 1:  DIABETES MELLITUS, TYPE II (ICD-250.00) Assessment Improved wife has made dietary adjustments for pt.   He is  tolerating actos.  he has mild LE swelling.  Maintain current medication regimen.  His updated medication list for this problem includes:    Benicar Hct 20-12.5 Mg Tabs (Olmesartan medoxomil-hctz) ..... One by mouth once daily    Actos 30 Mg Tabs (Pioglitazone hcl) ..... One by mouth once daily    Aspirin 81 Mg Tbec (Aspirin) ..... One by mouth once daily  Problem # 2:  HYPERTENSION (ICD-401.9) well controlled.  Maintain current medication regimen.  His updated medication list for this problem includes:    Benicar Hct 20-12.5 Mg Tabs (Olmesartan medoxomil-hctz) ..... One by mouth once daily  BP today: 110/70 Prior BP: 97/60 (05/07/2009)  Labs Reviewed: K+: 4.8 (05/07/2009) Creat: : 1.31 (05/07/2009)   Chol: 164 (05/07/2009)   HDL: 36 (05/07/2009)   LDL: 102 (05/07/2009)   TG: 128 (05/07/2009)  Problem # 3:  HYPERLIPIDEMIA, MILD (ICD-272.4) We discussed higher risk of CV disease in diabetics.  start statin and low dose asa His updated medication list for this problem includes:    Simvastatin 10 Mg Tabs (Simvastatin) ..... One by mouth qpm  Labs Reviewed: SGOT: 24 (05/07/2009)   SGPT: 25 (05/07/2009)   HDL:36 (05/07/2009), 34.8 (12/26/2007)  LDL:102 (05/07/2009), 84 (23/55/7322)  Chol:164 (05/07/2009), 142 (12/26/2007)  Trig:128 (05/07/2009), 117 (12/26/2007)  Complete Medication List: 1)  Benicar Hct 20-12.5 Mg Tabs (Olmesartan medoxomil-hctz) .... One by mouth once daily 2)  Viagra 100 Mg Tabs (Sildenafil citrate) .... 1/2-1 tablet by mouth as needed 3)  Actos 30 Mg Tabs (Pioglitazone hcl) .... One by mouth once daily 4)  Accu-chek Aviva Strp (Glucose blood) .... Use once daily 5)  Accu-chek Multiclix Lancets Misc (Lancets) .... Use as directed 6)  Simvastatin 10 Mg Tabs (Simvastatin) .... One by mouth qpm 7)  Zostavax [address] Unt/0.9ml Solr (Zoster vaccine live) .... Administer vaccine x 1 8)  Aspirin 81 Mg Tbec (Aspirin) .... One by mouth once daily  Patient Instructions: 1)   Please schedule a follow-up appointment in 6 months. 2)  BMP prior to visit, ICD-9: 401.9 3)  AST, ALT, FLP prior to visit, ICD-9: 272.4 4)  Please return for lab work one (1) week before your next appointment.  Prescriptions: ZOSTAVAX 60454 UNT/0.65ML SOLR (ZOSTER VACCINE LIVE) administer vaccine x 1  #1 x 0   Entered and Authorized by:   D. Thomos Lemons DO   Signed by:   D. Thomos Lemons DO on 06/18/2009   Method used:   Print then Give to Patient   RxID:   [id number] SIMVASTATIN 10 MG TABS (SIMVASTATIN) one by mouth qpm  #30 x 5   Entered and Authorized by:   D. Thomos Lemons DO   Signed by:   D. Thomos Lemons DO on 06/18/2009   Method used:   Electronically to        [facility] [address] (retail)       [phone]       [place], [place]  [address]       Ph: [phone]       Fax: [phone]   RxID:   [id number]   Current Allergies (reviewed today): No known allergies

## 2010-05-31 NOTE — Letter (Signed)
Summary: New Patient letter  [facility]  [address] [place], [place] [address]   Phone: [phone]  Fax: [phone]       12/28/2009 MRN: [id number]  [facility] [address] [place], [place]  [address]  Dear Mr. Neyens,  Welcome to the Gastroenterology Division at [facility].    You are scheduled to see Dr.  Leone Payor on February 11, 2010 at 1:45 p.m.  on the 3rd floor at [facility], [address]  We ask that you try to arrive at our office 15 minutes prior to your appointment time to allow for check-in.  We would like you to complete the enclosed self-administered evaluation form prior to your visit and bring it with you on the day of your appointment.  We will review it with you.  Also, please bring a complete list of all your medications or, if you prefer, bring the medication bottles and we will list them.  Please bring your insurance card so that we may make a copy of it.  If your insurance requires a referral to see a specialist, please bring your referral form from your primary care physician.  Co-payments are due at the time of your visit and may be paid by cash, check or credit card.     Your office visit will consist of a consult with your physician (includes a physical exam), any laboratory testing he/she may order, scheduling of any necessary diagnostic testing (e.g. x-ray, ultrasound, CT-scan), and scheduling of a procedure (e.g. Endoscopy, Colonoscopy) if required.  Please allow enough time on your schedule to allow for any/all of these possibilities.    If you cannot keep your appointment, please call [phone] to cancel or reschedule prior to your appointment date.  This allows [place] the opportunity to schedule an appointment for another patient in need of care.  If you do not cancel or reschedule by 5 p.m. the business day prior to your appointment date, you will be charged a $50.00 late cancellation/no-show fee.    Thank you for  choosing [facility] for your medical needs.  We appreciate the opportunity to care for you.  Please visit [place] at our website  to learn more about our practice.                     Sincerely,                                                             The Gastroenterology Division

## 2010-05-31 NOTE — Progress Notes (Signed)
Summary: Insurance Forms  Phone Note Call from Patient Call back at Work Phone [phone]   Caller: Patient Summary of Call: Pt would like to pick up insurance forms from prior visit today Initial call taken by: Lannette Donath,  December 17, 2009 2:06 PM  Follow-up for Phone Call        call was returne to patient, he was not available. Wife Corrie Dandy was informed [employer] forms were not in Dr Waynette Buttery office. She was provided with medical records number to check for paper. She was also provided with the fax number to our office. She was advised if Medical records does not have paperwork to contact [employer] and have them fax the forms to office to my attention.  Follow-up by: Glendell Docker CMA,  December 21, 2009 9:49 AM

## 2010-05-31 NOTE — Letter (Signed)
Summary: [facility]  [facility]   Imported By: Lanelle Bal 01/07/2010 11:05:48  _____________________________________________________________________  External Attachment:    Type:   Image     Comment:   External Document

## 2010-05-31 NOTE — Miscellaneous (Signed)
Summary: Diabetic Eye Exam  Clinical Lists Changes  Observations: Added new observation of DMEYEEXAMNXT: 03/2011 (03/08/2010 12:07) Added new observation of DMEYEEXMRES: normal (02/25/2010 12:09) Added new observation of EYE EXAM BY: Dr Heather [place] (02/25/2010 12:09) Added new observation of DIAB EYE EX: normal (02/25/2010 12:09)       Diabetes Management Exam:    Eye Exam:       Eye Exam done elsewhere          Date: 02/25/2010          Results: normal          Done by: Dr Heather Burundi

## 2010-05-31 NOTE — Letter (Signed)
Summary: [facility]  [facility]   Imported By: Lanelle Bal 01/07/2010 11:04:45  _____________________________________________________________________  External Attachment:    Type:   Image     Comment:   External Document

## 2010-05-31 NOTE — Letter (Signed)
Summary: [facility]  [facility]   Imported By: Lanelle Bal 01/07/2010 11:06:23  _____________________________________________________________________  External Attachment:    Type:   Image     Comment:   External Document

## 2010-05-31 NOTE — Letter (Signed)
Summary: Patient Notice- Polyp Results  [facility]  [address] [place], [place] [address]   Phone: [phone]  Fax: [phone]        March 08, 2010 MRN: [id number]    David Cannon [address] [place], [place]  [address]    Dear Mr. Sluder,  One of the polyps removed from your colon was adenomatous. This means that it was pre-cancerous or that  it had the potential to change into cancer over time. the other was not pre-cancerous.  I recommend that you have a repeat colonoscopy in 5 years to determine if you have developed any new polyps over time and to screen for colorectal cancer. If you develop any new rectal bleeding, abdominal pain or significant bowel habit changes, please contact us before then.  In addition to repeating colonoscopy, changing health habits may reduce your risk of having more colon polyps and possibly, colon cancer. You may lower your risk of future polyps and colon cancer by adopting healthy habits such as not smoking or using tobacco (if you do), being physically active, losing weight (if overweight), and eating a diet which includes fruits and vegetables and limits red meat.  Please call us if you are having persistent problems or have questions about your condition that have not been fully answered at this time.   Sincerely,  Iva Boop MD, [facility]  This letter has been electronically signed by your physician.  Appended Document: Patient Notice- Polyp Results letter mailed

## 2010-05-31 NOTE — Progress Notes (Signed)
Summary: Viagra Refill  Phone Note Refill Request Call back at Work Phone [phone] Message from:  Patient on July 19, 2009 4:53 PM  Refills Requested: Medication #1:  VIAGRA 100 MG TABS 1/2-1 tablet by mouth as needed   Dosage confirmed as above?Dosage Confirmed   Supply Requested: 3 months [facility] [address] .... Call pt. as soon as this is completed [phone] he is requesting a 90 day supply  Initial call taken by: Michaelle Copas,  July 19, 2009 4:54 PM  Follow-up for Phone Call        Rx completed in Dr. Tiajuana Amass Follow-up by: Glendell Docker CMA,  July 20, 2009 8:19 AM    Prescriptions: VIAGRA 100 MG TABS (SILDENAFIL CITRATE) 1/2-1 tablet by mouth as needed  #21 x 3   Entered by:   Glendell Docker CMA   Authorized by:   D. Thomos Lemons DO   Signed by:   Glendell Docker CMA on 07/20/2009   Method used:   [facility] to        [facility] [phone]* (retail)       [address]       [place], [place]  [address]       Ph: [phone] or [phone]       Fax: [phone]   RxID:   [id number]

## 2010-06-01 ENCOUNTER — Encounter: Payer: Self-pay | Admitting: Internal Medicine

## 2010-06-22 NOTE — Letter (Signed)
Summary: [facility]  [facility]   Imported By: Maryln Gottron 06/17/2010 10:28:47  _____________________________________________________________________  External Attachment:    Type:   Image     Comment:   External Document

## 2010-07-12 LAB — GLUCOSE, CAPILLARY

## 2010-08-02 ENCOUNTER — Telehealth: Payer: Self-pay | Admitting: *Deleted

## 2010-08-02 MED ORDER — TADALAFIL 10 MG PO TABS: 10.0000 mg | ORAL_TABLET | ORAL | Status: DC | PRN

## 2010-08-02 NOTE — Telephone Encounter (Signed)
Patient called and left voice message wanting to know if he could change from Viagra to Cialis.

## 2010-08-02 NOTE — Telephone Encounter (Signed)
Call placed to patient at [phone], no answer. A detailed voice message was left informing patient per Dr Artist Pais instructions

## 2010-08-02 NOTE — Telephone Encounter (Signed)
See rx for cialis.   Please have pt call if any problems with new medication

## 2010-08-03 ENCOUNTER — Telehealth: Payer: Self-pay | Admitting: Internal Medicine

## 2010-08-03 DIAGNOSIS — E119 Type 2 diabetes mellitus without complications: Secondary | ICD-10-CM

## 2010-08-03 MED ORDER — PIOGLITAZONE HCL 30 MG PO TABS: 30.0000 mg | ORAL_TABLET | Freq: Every day | ORAL | Status: DC

## 2010-08-03 NOTE — Telephone Encounter (Signed)
Refill- actos 30mg  tablet. Take 1 tablet by mouth every day. Qty 90. Last fill 12.27.11.

## 2010-08-03 NOTE — Telephone Encounter (Signed)
Actos refill sent to pharmacy 

## 2010-08-04 ENCOUNTER — Other Ambulatory Visit: Payer: Self-pay | Admitting: *Deleted

## 2010-08-04 DIAGNOSIS — E785 Hyperlipidemia, unspecified: Secondary | ICD-10-CM

## 2010-08-04 NOTE — Telephone Encounter (Signed)
Patient called and left voice message stating he is in need of refill for Simvastatin. His message states that he has a 7 day supply remaining

## 2010-08-12 ENCOUNTER — Telehealth: Payer: Self-pay | Admitting: Internal Medicine

## 2010-08-12 DIAGNOSIS — E785 Hyperlipidemia, unspecified: Secondary | ICD-10-CM

## 2010-08-12 MED ORDER — SIMVASTATIN 10 MG PO TABS: 10.0000 mg | ORAL_TABLET | Freq: Every day | ORAL | Status: DC

## 2010-08-12 NOTE — Telephone Encounter (Signed)
Rx refill for Simvastatin sent to pharmacy 

## 2010-08-12 NOTE — Telephone Encounter (Signed)
Refill- simvastatin 10mg  tablet. Take 1 tablet by mouth every evening. Qty 30.   Per pharmacy--patient completely out of above medication. Patient unsure if he is still to continue.

## 2010-10-17 NOTE — Telephone Encounter (Signed)
Medication refilled on 08/12/2010. Patient will need office visit prior to additional refills

## 2010-11-18 ENCOUNTER — Telehealth: Payer: Self-pay | Admitting: *Deleted

## 2010-11-18 MED ORDER — TADALAFIL 10 MG PO TABS: 10.0000 mg | ORAL_TABLET | ORAL | Status: DC | PRN

## 2010-11-18 NOTE — Telephone Encounter (Signed)
Received message from pt requesting a 90 day supply of Cialis. Pt last seen 12/07/09 and advised f/u in 6 months. Pt has no future appts on file. Please advise.

## 2010-11-18 NOTE — Telephone Encounter (Signed)
Can dispense #6 only. Is diabetic and hyperlipidemic not seen for soon time. Needs appt

## 2010-11-18 NOTE — Telephone Encounter (Signed)
Pt notified and scheduled f/u for 11/25/10 at 4pm. Refill sent to pharmacy as below.

## 2010-11-22 ENCOUNTER — Encounter: Payer: Self-pay | Admitting: Internal Medicine

## 2010-11-25 ENCOUNTER — Encounter: Payer: Self-pay | Admitting: Internal Medicine

## 2010-11-25 ENCOUNTER — Ambulatory Visit (INDEPENDENT_AMBULATORY_CARE_PROVIDER_SITE_OTHER): Payer: BC Managed Care – PPO | Admitting: Internal Medicine

## 2010-11-25 DIAGNOSIS — Q828 Other specified congenital malformations of skin: Secondary | ICD-10-CM

## 2010-11-25 DIAGNOSIS — E119 Type 2 diabetes mellitus without complications: Secondary | ICD-10-CM

## 2010-11-25 DIAGNOSIS — E785 Hyperlipidemia, unspecified: Secondary | ICD-10-CM

## 2010-11-25 DIAGNOSIS — M79609 Pain in unspecified limb: Secondary | ICD-10-CM

## 2010-11-25 DIAGNOSIS — R599 Enlarged lymph nodes, unspecified: Secondary | ICD-10-CM

## 2010-11-25 DIAGNOSIS — M79646 Pain in unspecified finger(s): Secondary | ICD-10-CM

## 2010-11-25 MED ORDER — CEPHALEXIN 500 MG PO CAPS: 500.0000 mg | ORAL_CAPSULE | Freq: Three times a day (TID) | ORAL | Status: AC

## 2010-11-25 NOTE — Patient Instructions (Signed)
Please schedule chem7, a1c, urine microalbumin 250.0 and lipid/lft 272.4 prior to next visit 

## 2010-11-27 DIAGNOSIS — Q828 Other specified congenital malformations of skin: Secondary | ICD-10-CM | POA: Insufficient documentation

## 2010-11-27 DIAGNOSIS — M79646 Pain in unspecified finger(s): Secondary | ICD-10-CM | POA: Insufficient documentation

## 2010-11-27 DIAGNOSIS — R599 Enlarged lymph nodes, unspecified: Secondary | ICD-10-CM | POA: Insufficient documentation

## 2010-11-27 NOTE — Assessment & Plan Note (Signed)
Likely contribution from repetitive motion. Attempt to avoid anti-inflammatories pending Chem-7

## 2010-11-27 NOTE — Assessment & Plan Note (Signed)
Obtain Chem-7 and A1c. Decrease Actos dose 15 mg daily with samples provided. Focus on appropriate diabetic diet exercise and weight loss. Resume blood sugar monitoring. May attempt to cease Actos if blood sugars remain under good control with Actos 15

## 2010-11-27 NOTE — Progress Notes (Signed)
  Subjective:    Patient ID: David Cannon, male    DOB: 1950-06-03, 60 y.o.   MRN: [id number]  HPI patient presents chronic followup of multiple medical problems. Complains of left thumb pain at the base over the past 2 months. No injury or trauma but uses computer equipment and forwarded motions. Has history of diabetes taking Actos and is aware of potential concerns of the medication and feels comfortable continuing this. Current diabetic control unknown. Tolerate statin therapy without myalgias or abnormal LFTs. Has enlarged lymph node left posterior neck over the past 3 months. Not growing in size. No recent infection. Denies unintended weight loss, sweats, fevers or adenopathy of the axilla or groin. Complains of multiple skin tags involving right lateral neck as well as right buttock which are irritated intermittently being pulled on by jewelry or clothing. Requests treatment. No other complaints.  Reviewed past medical history, medications and allergies  Review of Systems see history of present illness     Objective:   Physical Exam  Physical Exam  Vitals reviewed. Constitutional:  appears well-developed and well-nourished. No distress.  HENT:  Head: Normocephalic and atraumatic.  Nose: Nose normal.  Mouth/Throat: Oropharynx is clear and moist. No oropharyngeal exudate.  Eyes: Conjunctivae and EOM are normal. Pupils are equal, round, and reactive to light. Right eye exhibits no discharge. Left eye exhibits no discharge. No scleral icterus.  Neck: Neck supple. No thyromegaly present. Soft tissue mass left posterior neck consistent with single enlarged lymph node. Mobile and nontender measuring approximately 2 cm.  Cardiovascular: Normal rate, regular rhythm and normal heart sounds.  Exam reveals no gallop and no friction rub.   No murmur heard. Pulmonary/Chest: Effort normal and breath sounds normal. No respiratory distress.  has no wheezes.  has no rales.  Lymphadenopathy:   no  cervical adenopathy.  Neurological:  is alert.  Skin: Skin is warm and dry.  not diaphoretic. Left buttock with skin tag and right lateral neck with approximately 4 small skin tags  Psychiatric: normal mood and affect.  Muscle skeletal: Full range of motion bilateral thumbs without erythema warmth or effusion. No click or hesitation. Nontender.        Assessment & Plan:   No problem-specific assessment & plan notes found for this encounter.

## 2010-11-27 NOTE — Assessment & Plan Note (Signed)
Multiple irritated skin tags. Discussed risks benefits and potential complications of cryotherapy. Patient stated understanding and wished to proceed. Liquid nitrogen applied using small cotton tip applicator to 3 skin tags located right lateral neck and one skin tag right buttock. Was applied briefly for only a few seconds until halo effect noted. Repeated several times each skin tag. Discussed after procedure care. Followup if no improvement or worsening.

## 2010-11-27 NOTE — Assessment & Plan Note (Signed)
Stable. Obtain fasting lipid profile and liver function tests

## 2010-11-27 NOTE — Assessment & Plan Note (Signed)
Obtain CBC and attempt empiric course of antibiotic. Followup in clinic if lymph node does not decrease in size or resolved.

## 2010-11-28 ENCOUNTER — Other Ambulatory Visit: Payer: BC Managed Care – PPO

## 2010-11-29 ENCOUNTER — Ambulatory Visit: Payer: BC Managed Care – PPO

## 2010-11-29 DIAGNOSIS — E785 Hyperlipidemia, unspecified: Secondary | ICD-10-CM

## 2010-11-29 DIAGNOSIS — E119 Type 2 diabetes mellitus without complications: Secondary | ICD-10-CM

## 2010-11-29 LAB — HEPATIC FUNCTION PANEL
Albumin: 4.4 g/dL (ref 3.5–5.2)
Total Protein: 7.2 g/dL (ref 6.0–8.3)

## 2010-11-29 LAB — CBC WITH DIFFERENTIAL/PLATELET
Basophils Relative: 0.4 % (ref 0.0–3.0)
Eosinophils Absolute: 0.1 10*3/uL (ref 0.0–0.7)
Eosinophils Relative: 1.9 % (ref 0.0–5.0)
HCT: 40.2 % (ref 39.0–52.0)
Lymphs Abs: 2.6 10*3/uL (ref 0.7–4.0)
MCHC: 32.3 g/dL (ref 30.0–36.0)
MCV: 74.3 fl — ABNORMAL LOW (ref 78.0–100.0)
Monocytes Absolute: 0.4 10*3/uL (ref 0.1–1.0)
Platelets: 224 10*3/uL (ref 150.0–400.0)
RBC: 5.4 Mil/uL (ref 4.22–5.81)
WBC: 6.3 10*3/uL (ref 4.5–10.5)

## 2010-11-29 LAB — BASIC METABOLIC PANEL
BUN: 16 mg/dL (ref 6–23)
Calcium: 8.9 mg/dL (ref 8.4–10.5)
GFR: 64.95 mL/min (ref >60.00)
Glucose, Bld: 82 mg/dL (ref 70–99)

## 2010-11-29 LAB — LIPID PANEL
Cholesterol: 113 mg/dL (ref 0–200)
HDL: 46.7 mg/dL (ref >39.00)
Triglycerides: 56 mg/dL (ref 0.0–149.0)

## 2010-12-14 ENCOUNTER — Other Ambulatory Visit: Payer: Self-pay | Admitting: Internal Medicine

## 2010-12-22 ENCOUNTER — Telehealth: Payer: Self-pay | Admitting: Internal Medicine

## 2010-12-22 MED ORDER — OLMESARTAN MEDOXOMIL-HCTZ 20-12.5 MG PO TABS: 1.0000 | ORAL_TABLET | Freq: Every day | ORAL | Status: DC

## 2010-12-22 NOTE — Telephone Encounter (Signed)
Refill- benicar hct 20-12.5mg  tablet. Take one tablet by mouth every day. Qty 90. Last fill 4.27.12

## 2010-12-22 NOTE — Telephone Encounter (Signed)
Rx refill sent to pharmacy. 

## 2011-01-27 ENCOUNTER — Telehealth: Payer: Self-pay | Admitting: Internal Medicine

## 2011-01-27 NOTE — Telephone Encounter (Signed)
Patient called and left voice message, stating that he has requested a refill from the pharmacy for Actos. His message stated that he was changed from 30 mg to 15mg . He is requesting to stay at the 15 mg versus taking the 30mg , however is is having some problems with the medication.    Call was returned to patient at [phone], no answer. A detailed voice message was left advising patient to call back with additional information regarding his concerns with the Actos.

## 2011-01-27 NOTE — Telephone Encounter (Signed)
Patient returned phone call, and stated that he has  seen "dots" before his eyes, with taking the Actos.He stated that he is not sure if it is not related to him not eating. So he is wanting to try  15 mg for another 30 day, just to make sure, and after 30 days he stated that he will schedule return office visit for evaluation.

## 2011-01-27 NOTE — Telephone Encounter (Signed)
Refill- actos 30mg  tablet. Take one tablet by mouth every day. Qty 90. Last fill 4.4.12

## 2011-01-27 NOTE — Telephone Encounter (Signed)
Last visit note:  "Decrease Actos dose 15 mg daily with samples provided. Focus on appropriate diabetic diet exercise and weight loss. Resume blood sugar monitoring. May attempt to cease Actos if blood sugars remain under good control with Actos 15."  So if fsbs's are low 100's or better on 15mg  of actos then could try off and monitor fsbs.

## 2011-01-27 NOTE — Telephone Encounter (Signed)
Call placed to patient at [phone], no answer. A detailed voice message was left informing patient per Dr Rodena Medin instructions.

## 2011-02-02 ENCOUNTER — Other Ambulatory Visit: Payer: Self-pay | Admitting: Internal Medicine

## 2011-02-02 DIAGNOSIS — E119 Type 2 diabetes mellitus without complications: Secondary | ICD-10-CM

## 2011-02-02 MED ORDER — PIOGLITAZONE HCL 15 MG PO TABS: 15.0000 mg | ORAL_TABLET | Freq: Every day | ORAL | Status: DC

## 2011-02-24 ENCOUNTER — Telehealth: Payer: Self-pay | Admitting: Internal Medicine

## 2011-02-24 NOTE — Telephone Encounter (Signed)
[facility] [phone], spoke with Misty Stanley , she has verified Rx refill was received on October 4 th. No refills needed at this time.

## 2011-02-24 NOTE — Telephone Encounter (Signed)
Actos 30mg  tab  # 90  Last filled 08/03/10   Take 1 tablet by mouth daily

## 2011-03-03 ENCOUNTER — Ambulatory Visit (INDEPENDENT_AMBULATORY_CARE_PROVIDER_SITE_OTHER): Payer: BC Managed Care – PPO | Admitting: Internal Medicine

## 2011-03-03 ENCOUNTER — Encounter: Payer: Self-pay | Admitting: *Deleted

## 2011-03-03 ENCOUNTER — Encounter: Payer: Self-pay | Admitting: Internal Medicine

## 2011-03-03 VITALS — BP 146/82 | HR 72 | Temp 98.3°F | Wt 219.0 lb

## 2011-03-03 DIAGNOSIS — I10 Essential (primary) hypertension: Secondary | ICD-10-CM

## 2011-03-03 DIAGNOSIS — N529 Male erectile dysfunction, unspecified: Secondary | ICD-10-CM

## 2011-03-03 DIAGNOSIS — E119 Type 2 diabetes mellitus without complications: Secondary | ICD-10-CM

## 2011-03-03 MED ORDER — SILDENAFIL CITRATE 100 MG PO TABS: 100.0000 mg | ORAL_TABLET | ORAL | Status: DC | PRN

## 2011-03-03 MED ORDER — SITAGLIPTIN PHOSPHATE 100 MG PO TABS: 100.0000 mg | ORAL_TABLET | Freq: Every day | ORAL | Status: DC

## 2011-03-03 NOTE — Assessment & Plan Note (Signed)
Stable.  Continue current medication regimen. Lab Results  Component Value Date   CREATININE 1.4 11/29/2010

## 2011-03-03 NOTE — Assessment & Plan Note (Signed)
Better response to viagra vs cialis.   rx for viagra provided.  He is tolerating w/o significant side effects.

## 2011-03-03 NOTE — Patient Instructions (Signed)
Please complete the following lab tests before your next follow up appointment: BMET, A1c - 250.00 

## 2011-03-03 NOTE — Progress Notes (Signed)
Subjective:    Patient ID: David Cannon, male    DOB: [birth year], 60 y.o.   MRN: [id number]  HPI  60 year old African American male with type 2 diabetes, hypertension and mild renal insufficiency for routine followup. It has been almost 2 years since his last followup with me. He has since seen Dr. Rodena Medin in July 2012. There was concern of patient taking Actos for his type 2 diabetes and his dose was reduced to 15 mg once daily. Apparently he received a generic version of Actos which he thinks caused potential side effect. He describes flashes of light / visual change.  His symptoms have since decreased since stopping Actos.  He has not followed up with an ophthalmologist.   He has been following a healthy diabetic diet. His a.m. blood sugars have been fairly stable.  ED - he feels viagra works better for him vs cialis.  He requests refill  Review of Systems Negative for chest  pain,  negative for vision loss  Past Medical History  Diagnosis Date  . CRI (chronic renal insufficiency)   . Hypertension   . Diabetes mellitus     type II    History   Social History  . Marital Status: Married    Spouse Name: N/A    Number of Children: N/A  . Years of Education: N/A   Occupational History  . Not on file.   Social History Main Topics  . Smoking status: Never Smoker   . Smokeless tobacco: Not on file  . Alcohol Use: Yes     social  . Drug Use: No  . Sexually Active:    Other Topics Concern  . Not on file   Social History Narrative  . No narrative on file    No past surgical history on file.  Family History  Problem Relation Age of Onset  . Cancer Mother     lung  . Cancer Father     prostate  . Cancer Maternal Grandfather     colon  . Coronary artery disease Neg Hx     No Known Allergies  Current Outpatient Prescriptions on File Prior to Visit  Medication Sig Dispense Refill  . aspirin 81 MG EC tablet Take 81 mg by mouth daily.        Marland Kitchen glucose blood  (ACCU-CHEK AVIVA) test strip 1 each by Other route as needed. Use as instructed       . Lancets (ACCU-CHEK MULTICLIX) lancets 1 each by Other route as needed. Use as instructed       . olmesartan-hydrochlorothiazide (BENICAR HCT) 20-12.5 MG per tablet Take 1 tablet by mouth daily.  90 tablet  1  . simvastatin (ZOCOR) 10 MG tablet TAKE 1 TABLET BY MOUTH EVERY DAY AT BEDTIME  30 tablet  3  . tadalafil (CIALIS) 10 MG tablet Take 1 tablet (10 mg total) by mouth as needed for erectile dysfunction.  6 tablet  0  . zoster vaccine live, PF, (ZOSTAVAX) 40981 UNT/0.65ML injection Inject 0.65 mLs into the skin once.          BP 146/82  Pulse 72  Temp(Src) 98.3 F (36.8 C) (Oral)  Wt 219 lb (99.338 kg)       Objective:   Physical Exam   Constitutional: Appears well-developed and well-nourished. No distress.  Head: Normocephalic and atraumatic.  Ear:  Right and left ear normal.  TMs clear.  Hearing is grossly normal Mouth/Throat: Oropharynx is clear and moist.  Eyes: Conjunctivae are  normal. Pupils are equal, round, and reactive to light.  limited funduscopic exam-unable to appreciate retinal hemorrhages Neck: Normal range of motion. Neck supple. No thyromegaly present. No carotid bruit Cardiovascular: Normal rate, regular rhythm and normal heart sounds.  Exam reveals no gallop and no friction rub.  No murmur heard. Pulmonary/Chest: Effort normal and breath sounds normal.  No wheezes. No rales.  Neurological: Alert. No cranial nerve deficit.  Skin: Skin is warm and dry.  Psychiatric: Normal mood and affect. Behavior is normal.        Assessment & Plan:

## 2011-03-03 NOTE — Assessment & Plan Note (Addendum)
Patient stopped Actos due to concerns that it was affecting his vision. It is unclear whether this is a side effect of his diabetes medication versus retinal abnormality. Patient strongly urged to followup with an ophthalmologist. Discontinue Actos.   Start  Januvia. Monitor A1c.

## 2011-04-14 ENCOUNTER — Telehealth: Payer: Self-pay | Admitting: Internal Medicine

## 2011-04-14 MED ORDER — GLUCOSE BLOOD VI STRP: ORAL_STRIP | Status: DC

## 2011-04-14 NOTE — Telephone Encounter (Signed)
rx sent in electronically 

## 2011-04-14 NOTE — Telephone Encounter (Signed)
Refill- accu chek aviva plus test strp. Use once daily. Qty 100 refills 1 last fill 5.15.12

## 2011-04-19 ENCOUNTER — Other Ambulatory Visit: Payer: Self-pay | Admitting: Family

## 2011-04-19 NOTE — Telephone Encounter (Signed)
rx sent in electronically 

## 2011-05-30 ENCOUNTER — Ambulatory Visit: Payer: BC Managed Care – PPO | Admitting: Internal Medicine

## 2011-07-28 ENCOUNTER — Other Ambulatory Visit: Payer: Self-pay | Admitting: Internal Medicine

## 2011-09-26 ENCOUNTER — Other Ambulatory Visit: Payer: Self-pay | Admitting: Internal Medicine

## 2011-11-23 ENCOUNTER — Other Ambulatory Visit: Payer: Self-pay | Admitting: Internal Medicine

## 2011-12-27 ENCOUNTER — Ambulatory Visit (INDEPENDENT_AMBULATORY_CARE_PROVIDER_SITE_OTHER)
Admission: RE | Admit: 2011-12-27 | Discharge: 2011-12-27 | Disposition: A | Discharge status: Home / Self Care | Payer: BC Managed Care – PPO | Source: Ambulatory Visit | Attending: Internal Medicine | Admitting: Internal Medicine

## 2011-12-27 ENCOUNTER — Encounter: Payer: Self-pay | Admitting: Internal Medicine

## 2011-12-27 ENCOUNTER — Ambulatory Visit (INDEPENDENT_AMBULATORY_CARE_PROVIDER_SITE_OTHER): Payer: BC Managed Care – PPO | Admitting: Internal Medicine

## 2011-12-27 VITALS — BP 152/92 | HR 82 | Temp 98.1°F | Wt 214.0 lb

## 2011-12-27 DIAGNOSIS — R05 Cough: Secondary | ICD-10-CM

## 2011-12-27 DIAGNOSIS — J209 Acute bronchitis, unspecified: Secondary | ICD-10-CM

## 2011-12-27 MED ORDER — MOMETASONE FURO-FORMOTEROL FUM 200-5 MCG/ACT IN AERO: 2.0000 | INHALATION_SPRAY | Freq: Two times a day (BID) | RESPIRATORY_TRACT | Status: DC

## 2011-12-27 MED ORDER — LEVOFLOXACIN 500 MG PO TABS: 500.0000 mg | ORAL_TABLET | Freq: Every day | ORAL | Status: DC

## 2011-12-27 MED ORDER — HYDROCODONE-HOMATROPINE 5-1.5 MG/5ML PO SYRP: 5.0000 mL | ORAL_SOLUTION | Freq: Two times a day (BID) | ORAL | Status: DC | PRN

## 2011-12-27 NOTE — Assessment & Plan Note (Signed)
61 year old [place] American male with one week of severe cough and wheezing. On exam patient has diffuse wheezing. Oxygen saturation is 98% on room air. His symptoms started after yard work one week ago. He did not have any history of asthma. Treat with Levaquin 500 mg once daily for one week. Obtain chest x-ray. Sample of the [facility] provided.  MDI instruction provided.

## 2011-12-27 NOTE — Progress Notes (Signed)
Subjective:    Patient ID: David Cannon, male    DOB: 09/23/1950, 61 y.o.   MRN: [id number]  HPI  61 year old African American male complains of cough and wheezing x1 week. He did not have any history of asthma but his symptoms started after mowing the grass a week ago. He describes a nonproductive cough. He denies fever or chills.  His wheezing is worse when he lays down at night.  Review of Systems Negative for shortness of breath.  Past Medical History  Diagnosis Date  . CRI (chronic renal insufficiency)   . Hypertension   . Diabetes mellitus     type II    History   Social History  . Marital Status: Married    Spouse Name: N/A    Number of Children: N/A  . Years of Education: N/A   Occupational History  . Not on file.   Social History Main Topics  . Smoking status: Never Smoker   . Smokeless tobacco: Not on file  . Alcohol Use: Yes     social  . Drug Use: No  . Sexually Active:    Other Topics Concern  . Not on file   Social History Narrative  . No narrative on file    No past surgical history on file.  Family History  Problem Relation Age of Onset  . Cancer Mother     lung  . Cancer Father     prostate  . Cancer Maternal Grandfather     colon  . Coronary artery disease Neg Hx     No Known Allergies  Current Outpatient Prescriptions on File Prior to Visit  Medication Sig Dispense Refill  . aspirin 81 MG EC tablet Take 81 mg by mouth daily.        Marland Kitchen BENICAR HCT 20-12.5 MG per tablet TAKE 1 TABLET BY MOUTH EVERY DAY  90 tablet  1  . glucose blood (ACCU-CHEK AVIVA) test strip Use as instructed  100 each  3  . Lancets (ACCU-CHEK MULTICLIX) lancets 1 each by Other route as needed. Use as instructed       . sildenafil (VIAGRA) 100 MG tablet Take 1 tablet (100 mg total) by mouth as needed for erectile dysfunction. 1/2 - 1 tablet po prn  10 tablet  5  . simvastatin (ZOCOR) 10 MG tablet TAKE 1 TABLET BY MOUTH EVERY DAY AT BEDTIME  30 tablet  0  .  sitaGLIPtin (JANUVIA) 100 MG tablet Take 1 tablet (100 mg total) by mouth daily.  30 tablet  5  . DISCONTD: tadalafil (CIALIS) 10 MG tablet Take 1 tablet (10 mg total) by mouth as needed for erectile dysfunction.  6 tablet  0  . Mometasone Furo-Formoterol Fum (DULERA) 200-5 MCG/ACT AERO Inhale 2 puffs into the lungs 2 (two) times daily.  8.8 g  0    BP 152/92  Pulse 82  Temp 98.1 F (36.7 C) (Oral)  Wt 214 lb (97.07 kg)       Objective:   Physical Exam  Constitutional: He is oriented to person, place, and time. He appears well-developed and well-nourished.  HENT:  Head: Normocephalic and atraumatic.  Right Ear: External ear normal.  Left Ear: External ear normal.  Mouth/Throat: Oropharynx is clear and moist.  Neck: Neck supple.  Cardiovascular: Normal rate, regular rhythm and normal heart sounds.   Pulmonary/Chest: Effort normal. He has wheezes.  Lymphadenopathy:    He has no cervical adenopathy.  Neurological: He is alert and oriented to  person, place, and time. No cranial nerve deficit.  Skin: Skin is warm and dry.  Psychiatric: He has a normal mood and affect. His behavior is normal.          Assessment & Plan:

## 2011-12-27 NOTE — Patient Instructions (Addendum)
Please call our office if your symptoms do not improve or gets worse. Please complete the following lab tests before your next follow up appointment: BMET, A1c, microalbumin/cr ratio - 250.00 FLP, LFTs - 272.4

## 2011-12-29 ENCOUNTER — Other Ambulatory Visit: Payer: Self-pay

## 2011-12-29 ENCOUNTER — Other Ambulatory Visit: Payer: BC Managed Care – PPO

## 2011-12-29 ENCOUNTER — Other Ambulatory Visit (INDEPENDENT_AMBULATORY_CARE_PROVIDER_SITE_OTHER): Payer: BC Managed Care – PPO

## 2011-12-29 DIAGNOSIS — E785 Hyperlipidemia, unspecified: Secondary | ICD-10-CM

## 2011-12-29 DIAGNOSIS — E119 Type 2 diabetes mellitus without complications: Secondary | ICD-10-CM

## 2011-12-29 LAB — LIPID PANEL
HDL: 38.3 mg/dL — ABNORMAL LOW (ref >39.00)
Total CHOL/HDL Ratio: 3
VLDL: 12.8 mg/dL (ref 0.0–40.0)

## 2011-12-29 LAB — BASIC METABOLIC PANEL
Calcium: 9 mg/dL (ref 8.4–10.5)
GFR: 68.02 mL/min (ref >60.00)
Glucose, Bld: 94 mg/dL (ref 70–99)
Sodium: 142 mEq/L (ref 135–145)

## 2011-12-29 LAB — HEPATIC FUNCTION PANEL: Total Bilirubin: 0.8 mg/dL (ref 0.3–1.2)

## 2011-12-29 LAB — MICROALBUMIN / CREATININE URINE RATIO: Creatinine,U: 218.3 mg/dL

## 2011-12-29 NOTE — Progress Notes (Signed)
Quick Note:  Called and spoke with pt and pt is aware. ______ 

## 2012-01-01 ENCOUNTER — Other Ambulatory Visit: Payer: Self-pay | Admitting: Internal Medicine

## 2012-01-02 ENCOUNTER — Telehealth: Payer: Self-pay | Admitting: Internal Medicine

## 2012-01-02 NOTE — Telephone Encounter (Signed)
See result note.  

## 2012-01-02 NOTE — Telephone Encounter (Signed)
Pts spouse called and said that are still waiting to get lab results.

## 2012-01-04 ENCOUNTER — Encounter: Payer: Self-pay | Admitting: Internal Medicine

## 2012-01-04 ENCOUNTER — Ambulatory Visit (INDEPENDENT_AMBULATORY_CARE_PROVIDER_SITE_OTHER): Payer: BC Managed Care – PPO | Admitting: Internal Medicine

## 2012-01-04 VITALS — BP 122/80 | HR 84 | Temp 98.7°F | Wt 214.0 lb

## 2012-01-04 DIAGNOSIS — E119 Type 2 diabetes mellitus without complications: Secondary | ICD-10-CM

## 2012-01-04 DIAGNOSIS — I10 Essential (primary) hypertension: Secondary | ICD-10-CM

## 2012-01-04 DIAGNOSIS — J209 Acute bronchitis, unspecified: Secondary | ICD-10-CM

## 2012-01-04 MED ORDER — SIMVASTATIN 10 MG PO TABS: 10.0000 mg | ORAL_TABLET | Freq: Every day | ORAL | Status: DC

## 2012-01-04 MED ORDER — OLMESARTAN MEDOXOMIL-HCTZ 20-12.5 MG PO TABS: 1.0000 | ORAL_TABLET | Freq: Every day | ORAL | Status: DC

## 2012-01-04 NOTE — Progress Notes (Signed)
Subjective:    Patient ID: David Cannon, male    DOB: 25-May-1950, 61 y.o.   MRN: [id number]  HPI  61 year old [place] American male with history of type 2 diabetes recently seen for acute bronchitis for followup. Patient reports since starting Levaquin and steroid inhaler his cough has significantly improved. His chest x-ray was negative for pneumonia.  Type 2 diabetes-he stopped taking Januvia. He did not feel is making significant difference in his blood sugars. He is exercising and following low-carb diet. His A1c is stable at 6.1.  Hyperlipidemia-LDL is well-controlled. He has very mild elevation of AST.  Review of Systems No cough or shortness of breath Negative for diarrhea  Past Medical History  Diagnosis Date  . CRI (chronic renal insufficiency)   . Hypertension   . Diabetes mellitus     type II    History   Social History  . Marital Status: Married    Spouse Name: N/A    Number of Children: N/A  . Years of Education: N/A   Occupational History  . Not on file.   Social History Main Topics  . Smoking status: Never Smoker   . Smokeless tobacco: Not on file  . Alcohol Use: Yes     social  . Drug Use: No  . Sexually Active:    Other Topics Concern  . Not on file   Social History Narrative  . No narrative on file    No past surgical history on file.  Family History  Problem Relation Age of Onset  . Cancer Mother     lung  . Cancer Father     prostate  . Cancer Maternal Grandfather     colon  . Coronary artery disease Neg Hx     No Known Allergies  Current Outpatient Prescriptions on File Prior to Visit  Medication Sig Dispense Refill  . aspirin 81 MG EC tablet Take 81 mg by mouth daily.        Marland Kitchen glucose blood (ACCU-CHEK AVIVA) test strip Use as instructed  100 each  3  . Lancets (ACCU-CHEK MULTICLIX) lancets 1 each by Other route as needed. Use as instructed       . sildenafil (VIAGRA) 100 MG tablet Take 1 tablet (100 mg total) by mouth as  needed for erectile dysfunction. 1/2 - 1 tablet po prn  10 tablet  5  . tadalafil (CIALIS) 10 MG tablet Take 10 mg by mouth as needed.      Marland Kitchen DISCONTD: BENICAR HCT 20-12.5 MG per tablet TAKE 1 TABLET BY MOUTH EVERY DAY  90 tablet  1  . DISCONTD: simvastatin (ZOCOR) 10 MG tablet TAKE 1 TABLET BY MOUTH EVERY DAY AT BEDTIME (NEEDS OFFICE VISIT)  30 tablet  0    BP 122/80  Pulse 84  Temp 98.7 F (37.1 C) (Oral)  Wt 214 lb (97.07 kg)       Objective:   Physical Exam  Constitutional: He is oriented to person, place, and time. He appears well-developed and well-nourished.  HENT:  Mouth/Throat: Oropharynx is clear and moist.  Neck: Neck supple.       No carotid bruit  Cardiovascular: Normal rate, regular rhythm and normal heart sounds.   Pulmonary/Chest: Effort normal and breath sounds normal. He has no wheezes. He has no rales.  Neurological: He is alert and oriented to person, place, and time.  Skin: Skin is warm.  Psychiatric: He has a normal mood and affect. His behavior is normal.  Assessment & Plan:

## 2012-01-04 NOTE — Assessment & Plan Note (Addendum)
Well controlled.  No change in medication.  Cr is stable. BP: 122/80 mmHg  Lab Results  Component Value Date   CREATININE 1.4 12/29/2011

## 2012-01-04 NOTE — Assessment & Plan Note (Signed)
Resolved with levaquin and dulera.  Chest x ray was negative for pneumonia.

## 2012-01-04 NOTE — Assessment & Plan Note (Signed)
Blood sugars are well-controlled on diet and exercise alone. Discontinue Januvia. Monitor A1c.  Lab Results  Component Value Date   HGBA1C 6.1 12/29/2011

## 2012-01-04 NOTE — Patient Instructions (Addendum)
Please complete the following lab tests before your next follow up appointment: BMET, A1c - 250.00 LFTs- 790.4

## 2012-02-02 ENCOUNTER — Telehealth: Payer: Self-pay | Admitting: Internal Medicine

## 2012-02-02 MED ORDER — SILDENAFIL CITRATE 100 MG PO TABS: 100.0000 mg | ORAL_TABLET | ORAL | Status: DC | PRN

## 2012-02-02 NOTE — Telephone Encounter (Signed)
Pt called req stronger sildenafil (VIAGRA) pill. Pls call.

## 2012-02-02 NOTE — Telephone Encounter (Signed)
Does it come any stronger than 100?

## 2012-02-02 NOTE — Telephone Encounter (Signed)
No, it does not come in higher strength.  We can provide sample of levitra or cialis if poor response to viagra.

## 2012-05-21 ENCOUNTER — Encounter: Payer: Self-pay | Admitting: Internal Medicine

## 2012-06-25 ENCOUNTER — Telehealth: Payer: Self-pay | Admitting: Internal Medicine

## 2012-06-25 MED ORDER — GLUCOSE BLOOD VI STRP: ORAL_STRIP | Status: DC

## 2012-06-25 NOTE — Telephone Encounter (Signed)
rx sent in electronically 

## 2012-06-25 NOTE — Telephone Encounter (Signed)
Refill- accu chek aviva plus test strip,. Use once daily qty 100. Last fill 5.15.2012

## 2012-06-26 ENCOUNTER — Other Ambulatory Visit: Payer: BC Managed Care – PPO

## 2012-07-03 ENCOUNTER — Ambulatory Visit: Payer: BC Managed Care – PPO | Admitting: Internal Medicine

## 2012-07-08 ENCOUNTER — Ambulatory Visit (INDEPENDENT_AMBULATORY_CARE_PROVIDER_SITE_OTHER): Payer: BC Managed Care – PPO | Admitting: Internal Medicine

## 2012-07-08 ENCOUNTER — Encounter: Payer: Self-pay | Admitting: Internal Medicine

## 2012-07-08 VITALS — BP 124/82 | HR 74 | Temp 98.1°F | Wt 219.0 lb

## 2012-07-08 DIAGNOSIS — I10 Essential (primary) hypertension: Secondary | ICD-10-CM

## 2012-07-08 DIAGNOSIS — E785 Hyperlipidemia, unspecified: Secondary | ICD-10-CM

## 2012-07-08 MED ORDER — SIMVASTATIN 10 MG PO TABS: 10.0000 mg | ORAL_TABLET | Freq: Every day | ORAL | Status: DC

## 2012-07-08 MED ORDER — OLMESARTAN MEDOXOMIL-HCTZ 20-12.5 MG PO TABS: 1.0000 | ORAL_TABLET | Freq: Every day | ORAL | Status: DC

## 2012-07-08 NOTE — Assessment & Plan Note (Signed)
Well controlled.  Monitor electrolytes and kidney function. BP: 124/82 mmHg

## 2012-07-08 NOTE — Assessment & Plan Note (Signed)
Continue low car diet and regular exercise.  Monitor A1c.

## 2012-07-08 NOTE — Assessment & Plan Note (Addendum)
Patient previously followed by nephrologist for mild renal insufficiency. He was recently released.  Monitor kidney function every 6 months. Patient understands to avoid over-the-counter NSAIDs.  Lab Results  Component Value Date   CREATININE 1.4 12/29/2011

## 2012-07-08 NOTE — Progress Notes (Signed)
  Subjective:    Patient ID: David Cannon, male    DOB: 1950/07/06, 62 y.o.   MRN: [id number]  HPI  62 year old African American male with history of hypertension and abnormal glucose routine followup. Patient reports good compliance with antihypertensives. His weight is stable. His home blood pressure readings are normal.  History of mild chronic renal disease-he has been followed by nephrologist. He was recently released to be followed by primary care office.  Abnormal glucose-he is due for A1c.   Review of Systems Negative for chest pain or shortness of breath  Past Medical History  Diagnosis Date  . CRI (chronic renal insufficiency)   . Hypertension   . Diabetes mellitus     type II    History   Social History  . Marital Status: Married    Spouse Name: N/A    Number of Children: N/A  . Years of Education: N/A   Occupational History  . Not on file.   Social History Main Topics  . Smoking status: Never Smoker   . Smokeless tobacco: Not on file  . Alcohol Use: Yes     Comment: social  . Drug Use: No  . Sexually Active:    Other Topics Concern  . Not on file   Social History Narrative  . No narrative on file    No past surgical history on file.  Family History  Problem Relation Age of Onset  . Cancer Mother     lung  . Cancer Father     prostate  . Cancer Maternal Grandfather     colon  . Coronary artery disease Neg Hx     No Known Allergies  Current Outpatient Prescriptions on File Prior to Visit  Medication Sig Dispense Refill  . aspirin 81 MG EC tablet Take 81 mg by mouth daily.        Marland Kitchen glucose blood (ACCU-CHEK AVIVA) test strip Use as instructed  100 each  3  . Lancets (ACCU-CHEK MULTICLIX) lancets 1 each by Other route as needed. Use as instructed        No current facility-administered medications on file prior to visit.    BP 124/82  Pulse 74  Temp(Src) 98.1 F (36.7 C) (Oral)  Wt 219 lb (99.338 kg)  BMI 29.7 kg/m2        Objective:   Physical Exam  Constitutional: He is oriented to person, place, and time. He appears well-developed and well-nourished.  HENT:  Head: Normocephalic and atraumatic.  Cardiovascular: Normal rate, regular rhythm and normal heart sounds.   Pulmonary/Chest: Effort normal and breath sounds normal. He has no wheezes.  Neurological: He is alert and oriented to person, place, and time. No cranial nerve deficit.  Psychiatric: He has a normal mood and affect. His behavior is normal.          Assessment & Plan:

## 2012-07-09 LAB — BASIC METABOLIC PANEL
BUN: 13 mg/dL (ref 6–23)
CO2: 27 mEq/L (ref 19–32)
Calcium: 9 mg/dL (ref 8.4–10.5)
Chloride: 109 mEq/L (ref 96–112)
Creatinine, Ser: 1.4 mg/dL (ref 0.4–1.5)
Glucose, Bld: 85 mg/dL (ref 70–99)

## 2012-07-09 LAB — HEPATIC FUNCTION PANEL
ALT: 36 U/L (ref 0–53)
AST: 30 U/L (ref 0–37)
Total Bilirubin: 0.7 mg/dL (ref 0.3–1.2)
Total Protein: 6.9 g/dL (ref 6.0–8.3)

## 2012-07-22 ENCOUNTER — Other Ambulatory Visit: Payer: Self-pay | Admitting: Internal Medicine

## 2012-11-04 ENCOUNTER — Telehealth: Payer: Self-pay | Admitting: Internal Medicine

## 2012-11-04 NOTE — Telephone Encounter (Signed)
PT called to request a 90 day supply of Lancets (ACCU-CHEK MULTICLIX) lancets be called into [facility] on [address]. Please assist.

## 2012-11-05 MED ORDER — ACCU-CHEK MULTICLIX LANCETS MISC: 1.0000 | Freq: Every day | Status: DC

## 2012-11-05 NOTE — Telephone Encounter (Signed)
rx sent in electronically 

## 2012-11-07 ENCOUNTER — Other Ambulatory Visit: Payer: Self-pay

## 2013-01-13 ENCOUNTER — Encounter: Payer: Self-pay | Admitting: Internal Medicine

## 2013-01-13 ENCOUNTER — Ambulatory Visit (INDEPENDENT_AMBULATORY_CARE_PROVIDER_SITE_OTHER): Payer: BC Managed Care – PPO | Admitting: Internal Medicine

## 2013-01-13 VITALS — BP 120/80 | Temp 98.3°F | Wt 228.0 lb

## 2013-01-13 DIAGNOSIS — Z125 Encounter for screening for malignant neoplasm of prostate: Secondary | ICD-10-CM

## 2013-01-13 DIAGNOSIS — I10 Essential (primary) hypertension: Secondary | ICD-10-CM

## 2013-01-13 DIAGNOSIS — E785 Hyperlipidemia, unspecified: Secondary | ICD-10-CM

## 2013-01-13 DIAGNOSIS — Z23 Encounter for immunization: Secondary | ICD-10-CM

## 2013-01-13 DIAGNOSIS — L723 Sebaceous cyst: Secondary | ICD-10-CM

## 2013-01-13 DIAGNOSIS — R7309 Other abnormal glucose: Secondary | ICD-10-CM

## 2013-01-13 MED ORDER — OLMESARTAN MEDOXOMIL-HCTZ 20-12.5 MG PO TABS: 1.0000 | ORAL_TABLET | Freq: Every day | ORAL | Status: DC

## 2013-01-13 MED ORDER — SIMVASTATIN 10 MG PO TABS: 10.0000 mg | ORAL_TABLET | Freq: Every day | ORAL | Status: DC

## 2013-01-13 MED ORDER — TADALAFIL 10 MG PO TABS: 10.0000 mg | ORAL_TABLET | Freq: Every day | ORAL | Status: DC | PRN

## 2013-01-13 NOTE — Assessment & Plan Note (Signed)
Patient gained 9 pounds since previous visit. Patient counseled on following a low carbohydrate diet. Monitor A1c. If A1c greater than 6.5, consider starting metformin. Lab Results  Component Value Date   CREATININE 1.4 07/08/2012

## 2013-01-13 NOTE — Progress Notes (Signed)
  Subjective:    Patient ID: David Cannon, male    DOB: August 07, 1950, 62 y.o.   MRN: [id number]  HPI  62 year old African American male with history of hypertension and abnormal glucose for followup. Patient reports dietary compliance has been worse. He has gained approximately 9 pounds since previous visit. He denies any polyuria or polydipsia.  Hypertension-good medication compliance.  Hyperlipidemia-no side effects from simvastatin. He is due for monitoring his liver function tests.  He has sebaceous cyst mid thoracic area. Has gotten slightly larger. He denies any tenderness or redness.  Review of Systems Negative for chest pain  Past Medical History  Diagnosis Date  . CRI (chronic renal insufficiency)   . Hypertension   . Other abnormal glucose     History   Social History  . Marital Status: Married    Spouse Name: N/A    Number of Children: N/A  . Years of Education: N/A   Occupational History  . Not on file.   Social History Main Topics  . Smoking status: Never Smoker   . Smokeless tobacco: Not on file  . Alcohol Use: Yes     Comment: social  . Drug Use: No  . Sexual Activity:    Other Topics Concern  . Not on file   Social History Narrative  . No narrative on file    No past surgical history on file.  Family History  Problem Relation Age of Onset  . Cancer Mother     lung  . Cancer Father     prostate  . Cancer Maternal Grandfather     colon  . Coronary artery disease Neg Hx     No Known Allergies  Current Outpatient Prescriptions on File Prior to Visit  Medication Sig Dispense Refill  . aspirin 81 MG EC tablet Take 81 mg by mouth daily.        Marland Kitchen glucose blood (ACCU-CHEK AVIVA) test strip Use as instructed  100 each  3  . Lancets (ACCU-CHEK MULTICLIX) lancets 1 each by Other route daily.  102 each  3   No current facility-administered medications on file prior to visit.    BP 120/80  Temp(Src) 98.3 F (36.8 C) (Oral)  Wt 228 lb  (103.42 kg)  BMI 30.92 kg/m2       Objective:   Physical Exam  Constitutional: He is oriented to person, place, and time. He appears well-developed and well-nourished.  Neck: Neck supple.  No carotid bruit  Cardiovascular: Normal rate, regular rhythm and normal heart sounds.   No murmur heard. Pulmonary/Chest: Breath sounds normal. He has no wheezes.  Abdominal: Soft.  Abdominal obesity  Neurological: He is alert and oriented to person, place, and time.  Skin: Skin is warm and dry.  1 cm sebaceous cyst mid thoracic area, non tender  Psychiatric: He has a normal mood and affect. His behavior is normal.          Assessment & Plan:

## 2013-01-13 NOTE — Assessment & Plan Note (Signed)
Well controlled.  No change in therapy. BP: 120/80 mmHg

## 2013-01-13 NOTE — Assessment & Plan Note (Signed)
Patient complains of probable 1 cm sebaceous cyst in the mid thoracic area. It is not infected and has not gotten significantly larger. Continue to monitor for now.

## 2013-01-14 LAB — LIPID PANEL: Total CHOL/HDL Ratio: 3

## 2013-01-14 LAB — BASIC METABOLIC PANEL
Calcium: 9.3 mg/dL (ref 8.4–10.5)
GFR: 70.79 mL/min (ref >60.00)
Glucose, Bld: 74 mg/dL (ref 70–99)
Potassium: 4.6 mEq/L (ref 3.5–5.1)
Sodium: 140 mEq/L (ref 135–145)

## 2013-01-14 LAB — HEPATIC FUNCTION PANEL
Albumin: 4.3 g/dL (ref 3.5–5.2)
Bilirubin, Direct: 0.1 mg/dL (ref 0.0–0.3)
Total Protein: 7.3 g/dL (ref 6.0–8.3)

## 2013-01-14 LAB — HEMOGLOBIN A1C: Hgb A1c MFr Bld: 6.4 % (ref 4.6–6.5)

## 2013-01-14 LAB — TSH: TSH: 0.71 u[IU]/mL (ref 0.35–5.50)

## 2013-01-15 ENCOUNTER — Telehealth: Payer: Self-pay | Admitting: *Deleted

## 2013-01-15 NOTE — Telephone Encounter (Signed)
FYI- Patient wanted Dr Artist Pais to know that he is on a special diet and that he had a lot of fruit before his lab work.  He wanted to repeat his A1c next week.  I explained the difference between a glucose level and an A1c.  Patient understood.

## 2013-02-24 ENCOUNTER — Encounter: Payer: Self-pay | Admitting: Internal Medicine

## 2013-03-06 ENCOUNTER — Other Ambulatory Visit: Payer: Self-pay

## 2013-04-03 ENCOUNTER — Telehealth: Payer: Self-pay | Admitting: Internal Medicine

## 2013-04-03 NOTE — Telephone Encounter (Signed)
Pt's wife called to ask if there is any special diet pt needs to be on for abnormal creatine levels.

## 2013-04-04 NOTE — Telephone Encounter (Signed)
He kidney function is not that bad to warrant special diet.  In general, drink plenty of water.  Avoid dehydration and OTC NSAIDs.

## 2013-04-04 NOTE — Telephone Encounter (Signed)
Left message for pt to call back  °

## 2013-04-08 NOTE — Telephone Encounter (Signed)
Pt's wife aware.

## 2013-07-28 ENCOUNTER — Encounter: Payer: Self-pay | Admitting: Family Medicine

## 2013-07-28 ENCOUNTER — Ambulatory Visit (INDEPENDENT_AMBULATORY_CARE_PROVIDER_SITE_OTHER): Payer: BC Managed Care – PPO | Admitting: Family Medicine

## 2013-07-28 VITALS — BP 122/76 | HR 79 | Temp 99.0°F | Wt 220.0 lb

## 2013-07-28 DIAGNOSIS — J209 Acute bronchitis, unspecified: Secondary | ICD-10-CM

## 2013-07-28 MED ORDER — HYDROCODONE-HOMATROPINE 5-1.5 MG/5ML PO SYRP: 5.0000 mL | ORAL_SOLUTION | Freq: Four times a day (QID) | ORAL | Status: AC | PRN

## 2013-07-28 MED ORDER — CEFUROXIME AXETIL 250 MG PO TABS: 250.0000 mg | ORAL_TABLET | Freq: Two times a day (BID) | ORAL | Status: DC

## 2013-07-28 NOTE — Progress Notes (Signed)
   Subjective:    Patient ID: David Cannon, male    DOB: 03/02/1951, 63 y.o.   MRN: [id number]  URI  Associated symptoms include congestion, coughing and a sore throat. Pertinent negatives include no chest pain or wheezing.   Visit patient seen with upper respiratory infection. Onset several days ago.   Initially had sore throat followed by nasal congestion and cough. Cough occasionally productive of sputum but mostly dry. No fever. No dyspnea. He took NyQuil with some relief. Patient is nonsmoker. No history of asthma.  Past Medical History  Diagnosis Date  . CRI (chronic renal insufficiency)   . Hypertension   . Other abnormal glucose    No past surgical history on file.  reports that he has never smoked. He does not have any smokeless tobacco history on file. He reports that he drinks alcohol. He reports that he does not use illicit drugs. family history includes Cancer in his father, maternal grandfather, and mother. There is no history of Coronary artery disease. No Known Allergies    Review of Systems  Constitutional: Negative for fever and chills.  HENT: Positive for congestion and sore throat.   Respiratory: Positive for cough. Negative for wheezing.   Cardiovascular: Negative for chest pain.       Objective:   Physical Exam  Constitutional: He appears well-developed and well-nourished.  HENT:  Right Ear: External ear normal.  Left Ear: External ear normal.  Mouth/Throat: Oropharynx is clear and moist.  Neck: Neck supple.  Cardiovascular: Normal rate and regular rhythm.   Pulmonary/Chest: Effort normal and breath sounds normal. No respiratory distress. He has no wheezes. He has no rales.  Lymphadenopathy:    He has no cervical adenopathy.          Assessment & Plan:  Acute bronchitis. We explained these are usually viral. Hycodan cough syrup 1 teaspoon each bedtime for severe nighttime cough. We have not recommended antibiotics at this point but consider  getting this filled if he developed any fever or worsening symptoms. If so, start Ceftin 200 mg twice a day for 10 days

## 2013-07-28 NOTE — Progress Notes (Signed)
Pre visit review using our clinic review tool, if applicable. No additional management support is needed unless otherwise documented below in the visit note. 

## 2013-07-28 NOTE — Patient Instructions (Signed)
Acute Bronchitis Bronchitis is inflammation of the airways that extend from the windpipe into the lungs (bronchi). The inflammation often causes mucus to develop. This leads to a cough, which is the most common symptom of bronchitis.  In acute bronchitis, the condition usually develops suddenly and goes away over time, usually in a couple weeks. Smoking, allergies, and asthma can make bronchitis worse. Repeated episodes of bronchitis may cause further lung problems.  CAUSES Acute bronchitis is most often caused by the same virus that causes a cold. The virus can spread from person to person (contagious).  SIGNS AND SYMPTOMS   Cough.   Fever.   Coughing up mucus.   Body aches.   Chest congestion.   Chills.   Shortness of breath.   Sore throat.  DIAGNOSIS  Acute bronchitis is usually diagnosed through a physical exam. Tests, such as chest X-rays, are sometimes done to rule out other conditions.  TREATMENT  Acute bronchitis usually goes away in a couple weeks. Often times, no medical treatment is necessary. Medicines are sometimes given for relief of fever or cough. Antibiotics are usually not needed but may be prescribed in certain situations. In some cases, an inhaler may be recommended to help reduce shortness of breath and control the cough. A cool mist vaporizer may also be used to help thin bronchial secretions and make it easier to clear the chest.  HOME CARE INSTRUCTIONS  Get plenty of rest.   Drink enough fluids to keep your urine clear or pale yellow (unless you have a medical condition that requires fluid restriction). Increasing fluids may help thin your secretions and will prevent dehydration.   Only take over-the-counter or prescription medicines as directed by your health care provider.   Avoid smoking and secondhand smoke. Exposure to cigarette smoke or irritating chemicals will make bronchitis worse. If you are a smoker, consider using nicotine gum or skin  patches to help control withdrawal symptoms. Quitting smoking will help your lungs heal faster.   Reduce the chances of another bout of acute bronchitis by washing your hands frequently, avoiding people with cold symptoms, and trying not to touch your hands to your mouth, nose, or eyes.   Follow up with your health care provider as directed.  SEEK MEDICAL CARE IF: Your symptoms do not improve after 1 week of treatment.  SEEK IMMEDIATE MEDICAL CARE IF:  You develop an increased fever or chills.   You have chest pain.   You have severe shortness of breath.  You have bloody sputum.   You develop dehydration.  You develop fainting.  You develop repeated vomiting.  You develop a severe headache. MAKE SURE YOU:   Understand these instructions.  Will watch your condition.  Will get help right away if you are not doing well or get worse. Document Released: 05/25/2004 Document Revised: 12/18/2012 Document Reviewed: 10/08/2012 Exit[employer] Patient Information 2014 [employer].  

## 2013-08-07 ENCOUNTER — Other Ambulatory Visit: Payer: Self-pay

## 2013-08-20 ENCOUNTER — Other Ambulatory Visit: Payer: Self-pay | Admitting: Internal Medicine

## 2013-08-21 ENCOUNTER — Other Ambulatory Visit (INDEPENDENT_AMBULATORY_CARE_PROVIDER_SITE_OTHER): Payer: BC Managed Care – PPO

## 2013-08-21 DIAGNOSIS — Z0000 Encounter for general adult medical examination without abnormal findings: Secondary | ICD-10-CM

## 2013-08-21 LAB — LIPID PANEL
Cholesterol: 111 mg/dL (ref 0–200)
HDL: 38.6 mg/dL — AB (ref >39.00)
LDL Cholesterol: 58 mg/dL (ref 0–99)
Total CHOL/HDL Ratio: 3
Triglycerides: 71 mg/dL (ref 0.0–149.0)
VLDL: 14.2 mg/dL (ref 0.0–40.0)

## 2013-08-21 LAB — POCT URINALYSIS DIPSTICK
BILIRUBIN UA: NEGATIVE
GLUCOSE UA: NEGATIVE
Ketones, UA: NEGATIVE
Leukocytes, UA: NEGATIVE
NITRITE UA: NEGATIVE
Protein, UA: NEGATIVE
SPEC GRAV UA: 1.02
Urobilinogen, UA: 0.2
pH, UA: 5.5

## 2013-08-21 LAB — BASIC METABOLIC PANEL
BUN: 15 mg/dL (ref 6–23)
CALCIUM: 9.7 mg/dL (ref 8.4–10.5)
CHLORIDE: 107 meq/L (ref 96–112)
CO2: 30 meq/L (ref 19–32)
CREATININE: 1.3 mg/dL (ref 0.4–1.5)
GFR: 71.92 mL/min (ref >60.00)
GLUCOSE: 92 mg/dL (ref 70–99)
Potassium: 4.8 mEq/L (ref 3.5–5.1)
Sodium: 142 mEq/L (ref 135–145)

## 2013-08-21 LAB — HEPATIC FUNCTION PANEL
ALK PHOS: 50 U/L (ref 39–117)
ALT: 35 U/L (ref 0–53)
AST: 30 U/L (ref 0–37)
Albumin: 3.8 g/dL (ref 3.5–5.2)
BILIRUBIN DIRECT: 0.1 mg/dL (ref 0.0–0.3)
Total Bilirubin: 0.9 mg/dL (ref 0.3–1.2)
Total Protein: 7 g/dL (ref 6.0–8.3)

## 2013-08-21 LAB — CBC WITH DIFFERENTIAL/PLATELET
Basophils Absolute: 0 10*3/uL (ref 0.0–0.1)
Basophils Relative: 0.3 % (ref 0.0–3.0)
EOS PCT: 2.8 % (ref 0.0–5.0)
Eosinophils Absolute: 0.2 10*3/uL (ref 0.0–0.7)
HCT: 41 % (ref 39.0–52.0)
Hemoglobin: 13 g/dL (ref 13.0–17.0)
LYMPHS PCT: 51.9 % — AB (ref 12.0–46.0)
Lymphs Abs: 4.1 10*3/uL — ABNORMAL HIGH (ref 0.7–4.0)
MCHC: 31.7 g/dL (ref 30.0–36.0)
MCV: 74.9 fl — ABNORMAL LOW (ref 78.0–100.0)
MONOS PCT: 6.6 % (ref 3.0–12.0)
Monocytes Absolute: 0.5 10*3/uL (ref 0.1–1.0)
NEUTROS PCT: 38.4 % — AB (ref 43.0–77.0)
Neutro Abs: 3 10*3/uL (ref 1.4–7.7)
Platelets: 242 10*3/uL (ref 150.0–400.0)
RBC: 5.47 Mil/uL (ref 4.22–5.81)
RDW: 16.5 % — ABNORMAL HIGH (ref 11.5–14.6)
WBC: 7.9 10*3/uL (ref 4.5–10.5)

## 2013-08-21 LAB — HEMOGLOBIN A1C: Hgb A1c MFr Bld: 6 % (ref 4.6–6.5)

## 2013-08-21 LAB — MICROALBUMIN / CREATININE URINE RATIO
CREATININE, U: 183.1 mg/dL
MICROALB UR: 0.9 mg/dL (ref 0.0–1.9)
MICROALB/CREAT RATIO: 0.5 mg/g (ref 0.0–30.0)

## 2013-08-21 LAB — TSH: TSH: 0.92 u[IU]/mL (ref 0.35–5.50)

## 2013-08-21 LAB — PSA: PSA: 0.52 ng/mL (ref 0.10–4.00)

## 2013-08-23 ENCOUNTER — Other Ambulatory Visit: Payer: Self-pay | Admitting: Internal Medicine

## 2013-08-27 ENCOUNTER — Ambulatory Visit (INDEPENDENT_AMBULATORY_CARE_PROVIDER_SITE_OTHER): Payer: BC Managed Care – PPO | Admitting: Internal Medicine

## 2013-08-27 ENCOUNTER — Encounter: Payer: Self-pay | Admitting: Internal Medicine

## 2013-08-27 VITALS — BP 130/80 | HR 71 | Temp 97.9°F | Ht 73.0 in | Wt 216.0 lb

## 2013-08-27 DIAGNOSIS — I10 Essential (primary) hypertension: Secondary | ICD-10-CM

## 2013-08-27 DIAGNOSIS — E785 Hyperlipidemia, unspecified: Secondary | ICD-10-CM

## 2013-08-27 DIAGNOSIS — M722 Plantar fascial fibromatosis: Secondary | ICD-10-CM

## 2013-08-27 DIAGNOSIS — Z0000 Encounter for general adult medical examination without abnormal findings: Secondary | ICD-10-CM | POA: Insufficient documentation

## 2013-08-27 DIAGNOSIS — R7309 Other abnormal glucose: Secondary | ICD-10-CM

## 2013-08-27 MED ORDER — OLMESARTAN MEDOXOMIL-HCTZ 20-12.5 MG PO TABS
1.0000 | ORAL_TABLET | Freq: Every day | ORAL | Status: DC
Start: 2013-08-27 — End: 2014-09-26

## 2013-08-27 MED ORDER — SIMVASTATIN 10 MG PO TABS: 10.0000 mg | ORAL_TABLET | Freq: Every day | ORAL | Status: DC

## 2013-08-27 NOTE — Assessment & Plan Note (Signed)
Well controlled.  No change in medication. BP: 130/80 mmHg

## 2013-08-27 NOTE — Progress Notes (Signed)
Pre visit review using our clinic review tool, if applicable. No additional management support is needed unless otherwise documented below in the visit note. 

## 2013-08-27 NOTE — Progress Notes (Signed)
Subjective:    Patient ID: David Cannon, male    DOB: 06/10/1950, 63 y.o.   MRN: [id number]  HPI  63 year old African American male with history of hypertension, abnormal glucose and chronic renal insufficiency for routine physical. Patient denies any significant interval medical history. Patient tolerating his medications without difficulty.  Health maintenance protocols reviewed.  Hypertension - stable  Hyperlipidemia - he is tolerating simvastatin.  Chronic renal insufficiency - serum Cr is stable.  Patient complains of chronic left heel pain. No hx of injury or trauma.  Pain is localized to medial aspect of left heel.   Review of Systems  Constitutional: Negative for activity change, appetite change and unexpected weight change.  Eyes: Negative for visual disturbance.  Respiratory: Negative for cough, chest tightness and shortness of breath.   Cardiovascular: Negative for chest pain.  Genitourinary: Negative for difficulty urinating.  Neurological: Negative for headaches.  Gastrointestinal: Negative for abdominal pain, heartburn melena or hematochezia Psych: Negative for depression or anxiety Endo:  No polyuria or polydypsia        Past Medical History  Diagnosis Date  . CRI (chronic renal insufficiency)   . Hypertension   . Other abnormal glucose     History   Social History  . Marital Status: Married    Spouse Name: N/A    Number of Children: N/A  . Years of Education: N/A   Occupational History  . Not on file.   Social History Main Topics  . Smoking status: Never Smoker   . Smokeless tobacco: Not on file  . Alcohol Use: Yes     Comment: social  . Drug Use: No  . Sexual Activity:    Other Topics Concern  . Not on file   Social History Narrative  . No narrative on file    No past surgical history on file.  Family History  Problem Relation Age of Onset  . Cancer Mother     lung  . Cancer Father     prostate  . Cancer Maternal  Grandfather     colon  . Coronary artery disease Neg Hx     No Known Allergies  Current Outpatient Prescriptions on File Prior to Visit  Medication Sig Dispense Refill  . aspirin 81 MG EC tablet Take 81 mg by mouth daily.        Marland Kitchen glucose blood (ACCU-CHEK AVIVA) test strip Use as instructed  100 each  3  . Lancets (ACCU-CHEK MULTICLIX) lancets 1 each by Other route daily.  102 each  3  . tadalafil (CIALIS) 10 MG tablet Take 1 tablet (10 mg total) by mouth daily as needed for erectile dysfunction.  10 tablet  11   No current facility-administered medications on file prior to visit.    BP 130/80  Pulse 71  Temp(Src) 97.9 F (36.6 C) (Oral)  Ht 6\' 1"  (1.854 m)  Wt 216 lb (97.977 kg)  BMI 28.50 kg/m2    Objective:   Physical Exam  Constitutional: He is oriented to person, place, and time. He appears well-developed and well-nourished. No distress.  HENT:  Head: Normocephalic and atraumatic.  Right Ear: External ear normal.  Left Ear: External ear normal.  Mouth/Throat: Oropharynx is clear and moist.  Eyes: Conjunctivae and EOM are normal. Pupils are equal, round, and reactive to light.  Neck: Neck supple.  No carotid bruit  Cardiovascular: Normal rate, regular rhythm and normal heart sounds.   No murmur heard. Pulmonary/Chest: Effort normal and breath sounds  normal. He has no wheezes.  Abdominal: Soft. Bowel sounds are normal. He exhibits no mass. There is no tenderness.  Musculoskeletal: He exhibits no edema.  Left foot (medial aspect of heel) tenderness  Lymphadenopathy:    He has no cervical adenopathy.  Neurological: He is alert and oriented to person, place, and time. No cranial nerve deficit.  Skin: Skin is warm and dry.  Psychiatric: He has a normal mood and affect. His behavior is normal.       Assessment & Plan:

## 2013-08-27 NOTE — Assessment & Plan Note (Signed)
Reviewed adult health maintenance protocols.  Patient's last colonoscopy was in 11/08/2004. She will be due for colonoscopy next year. Patient is up-to-date with adult vaccines. Annual influenza vaccine encouraged. Regular exercise and weight loss encouraged.

## 2013-08-27 NOTE — Assessment & Plan Note (Signed)
Stable.  Continue regular exercise and low carb diet. Lab Results  Component Value Date   HGBA1C 6.0 08/21/2013

## 2013-08-27 NOTE — Assessment & Plan Note (Signed)
Well controlled.  Continue simvastatin. Lab Results  Component Value Date   CHOL 111 08/21/2013   HDL 38.60* 08/21/2013   LDLCALC 58 08/21/2013   TRIG 71.0 08/21/2013   CHOLHDL 3 08/21/2013   Lab Results  Component Value Date   ALT 35 08/21/2013   AST 30 08/21/2013   ALKPHOS 50 08/21/2013   BILITOT 0.9 08/21/2013

## 2013-08-27 NOTE — Patient Instructions (Addendum)
Our office will contact you re: referral to Dr. Doran Durand Please complete the following lab tests before your next follow up appointment: BMET, A1c - 790.29, 401.9

## 2013-08-27 NOTE — Assessment & Plan Note (Signed)
63 year old [place] American male with signs and symptoms of left plantar fascitis. He has been having intermittent symptoms for over year. We discussed stretching exercises and use of heel inserts.  Refer to Dr. Doran Durand for further evaluation and treatment.  Patient understands to avoid NSAIDs considering history of renal insufficiency.

## 2014-04-13 ENCOUNTER — Other Ambulatory Visit: Payer: Self-pay | Admitting: Internal Medicine

## 2014-04-15 DIAGNOSIS — H3589 Other specified retinal disorders: Secondary | ICD-10-CM | POA: Diagnosis not present

## 2014-04-15 DIAGNOSIS — H2513 Age-related nuclear cataract, bilateral: Secondary | ICD-10-CM | POA: Diagnosis not present

## 2014-04-15 DIAGNOSIS — E119 Type 2 diabetes mellitus without complications: Secondary | ICD-10-CM | POA: Diagnosis not present

## 2014-05-04 ENCOUNTER — Other Ambulatory Visit: Payer: Self-pay | Admitting: Internal Medicine

## 2014-05-05 ENCOUNTER — Other Ambulatory Visit: Payer: Self-pay | Admitting: *Deleted

## 2014-05-05 MED ORDER — GLUCOSE BLOOD VI STRP: 1.0000 | ORAL_STRIP | Freq: Every day | Status: DC

## 2014-05-08 ENCOUNTER — Telehealth: Payer: Self-pay | Admitting: Internal Medicine

## 2014-05-08 ENCOUNTER — Other Ambulatory Visit: Payer: Self-pay | Admitting: *Deleted

## 2014-05-08 DIAGNOSIS — R7309 Other abnormal glucose: Secondary | ICD-10-CM

## 2014-05-08 MED ORDER — GLUCOSE BLOOD VI STRP: 1.0000 | ORAL_STRIP | Freq: Every day | Status: DC

## 2014-05-08 NOTE — Telephone Encounter (Signed)
Pharm called bc pt's glucose blood (ACCU-CHEK AVIVA PLUS) test strips will not be covered under current diagnosis code.  Pt doesn't have DM, only abnormal readings so medicare will not pay for test strips unless dx code is changed. pls advise . Pharm is going to refax rx. Thanks!

## 2014-05-08 NOTE — Telephone Encounter (Signed)
He can stop checking blood sugars as long as he come in every 4 months for A1c.  We put in standing order for A1c.

## 2014-05-08 NOTE — Telephone Encounter (Signed)
Patient is aware and lab order placed.

## 2014-05-13 ENCOUNTER — Encounter: Payer: Self-pay | Admitting: Internal Medicine

## 2014-05-20 ENCOUNTER — Ambulatory Visit (INDEPENDENT_AMBULATORY_CARE_PROVIDER_SITE_OTHER): Payer: Medicare Other | Admitting: Family Medicine

## 2014-05-20 ENCOUNTER — Encounter: Payer: Self-pay | Admitting: Family Medicine

## 2014-05-20 VITALS — BP 134/75 | HR 72 | Temp 98.5°F | Ht 73.0 in | Wt 224.0 lb

## 2014-05-20 DIAGNOSIS — M1000 Idiopathic gout, unspecified site: Secondary | ICD-10-CM

## 2014-05-20 MED ORDER — METHYLPREDNISOLONE 4 MG PO KIT: PACK | ORAL | Status: AC

## 2014-05-20 NOTE — Progress Notes (Signed)
   Subjective:    Patient ID: David Cannon, male    DOB: 1950/10/05, 64 y.o.   MRN: [id number]  HPI Here for 2 days of swelling and pain in the right 5th finger. No recent trauma. It was intensely painful yesterday but is a little better today. Ice helps a little bit. This has happened several times in the past few years but he has not sought treatment before.    Review of Systems  Constitutional: Negative.   Musculoskeletal: Positive for joint swelling and arthralgias.       Objective:   Physical Exam  Constitutional: He appears well-developed and well-nourished.  Musculoskeletal:  The right 5th finger is warm, swollen, red, and quite tender           Assessment & Plan:  This is likely a gout episode. Given a Medrol does pack. Check a uric acid level.

## 2014-05-20 NOTE — Progress Notes (Signed)
Pre visit review using our clinic review tool, if applicable. No additional management support is needed unless otherwise documented below in the visit note. 

## 2014-05-21 LAB — URIC ACID: URIC ACID, SERUM: 5.1 mg/dL (ref 4.0–7.8)

## 2014-09-26 ENCOUNTER — Ambulatory Visit (INDEPENDENT_AMBULATORY_CARE_PROVIDER_SITE_OTHER): Payer: Medicare Other | Admitting: Family Medicine

## 2014-09-26 VITALS — BP 134/76 | HR 77 | Temp 98.8°F | Ht 71.75 in | Wt 224.2 lb

## 2014-09-26 DIAGNOSIS — J069 Acute upper respiratory infection, unspecified: Secondary | ICD-10-CM | POA: Diagnosis not present

## 2014-09-26 DIAGNOSIS — J029 Acute pharyngitis, unspecified: Secondary | ICD-10-CM

## 2014-09-26 MED ORDER — MAGIC MOUTHWASH W/LIDOCAINE: 5.0000 mL | Freq: Four times a day (QID) | ORAL | Status: DC | PRN

## 2014-09-26 NOTE — Patient Instructions (Signed)
Saline nasal spray atleast 4 times per day if having nasal congestion, cepacol or other throat lozenge if needed for sore throat (I also prescribed magic mouthwash to gargle and spit if needed). Tylenol or motrin if needed. over the counter mucinex or mucinex DM if needed for cough, drink plenty of fluids.  Return to the clinic or go to the nearest emergency room if any of your symptoms worsen or new symptoms occur.  Sore Throat A sore throat is pain, burning, irritation, or scratchiness of the throat. There is often pain or tenderness when swallowing or talking. A sore throat may be accompanied by other symptoms, such as coughing, sneezing, fever, and swollen neck glands. A sore throat is often the first sign of another sickness, such as a cold, flu, strep throat, or mononucleosis (commonly known as mono). Most sore throats go away without medical treatment. CAUSES  The most common causes of a sore throat include:  A viral infection, such as a cold, flu, or mono.  A bacterial infection, such as strep throat, tonsillitis, or whooping cough.  Seasonal allergies.  Dryness in the air.  Irritants, such as smoke or pollution.  Gastroesophageal reflux disease (GERD). HOME CARE INSTRUCTIONS   Only take over-the-counter medicines as directed by your caregiver.  Drink enough fluids to keep your urine clear or pale yellow.  Rest as needed.  Try using throat sprays, lozenges, or sucking on hard candy to ease any pain (if older than 4 years or as directed).  Sip warm liquids, such as broth, herbal tea, or warm water with honey to relieve pain temporarily. You may also eat or drink cold or frozen liquids such as frozen ice pops.  Gargle with salt water (mix 1 tsp salt with 8 oz of water).  Do not smoke and avoid secondhand smoke.  Put a cool-mist humidifier in your bedroom at night to moisten the air. You can also turn on a hot shower and sit in the bathroom with the door closed for 5-10  minutes. SEEK IMMEDIATE MEDICAL CARE IF:  You have difficulty breathing.  You are unable to swallow fluids, soft foods, or your saliva.  You have increased swelling in the throat.  Your sore throat does not get better in 7 days.  You have nausea and vomiting.  You have a fever or persistent symptoms for more than 2-3 days.  You have a fever and your symptoms suddenly get worse. MAKE SURE YOU:   Understand these instructions.  Will watch your condition.  Will get help right away if you are not doing well or get worse. Document Released: 05/25/2004 Document Revised: 04/03/2012 Document Reviewed: 12/24/2011 [facility] Patient Information 2015 [place], [place]. This information is not intended to replace advice given to you by your health care provider. Make sure you discuss any questions you have with your health care provider.  Upper Respiratory Infection, Adult An upper respiratory infection (URI) is also sometimes known as the common cold. The upper respiratory tract includes the nose, sinuses, throat, trachea, and bronchi. Bronchi are the airways leading to the lungs. Most people improve within 1 week, but symptoms can last up to 2 weeks. A residual cough may last even longer.  CAUSES Many different viruses can infect the tissues lining the upper respiratory tract. The tissues become irritated and inflamed and often become very moist. Mucus production is also common. A cold is contagious. You can easily spread the virus to others by oral contact. This includes kissing, sharing a glass,  coughing, or sneezing. Touching your mouth or nose and then touching a surface, which is then touched by another person, can also spread the virus. SYMPTOMS  Symptoms typically develop 1 to 3 days after you come in contact with a cold virus. Symptoms vary from person to person. They may include:  Runny nose.  Sneezing.  Nasal congestion.  Sinus irritation.  Sore throat.  Loss of voice  (laryngitis).  Cough.  Fatigue.  Muscle aches.  Loss of appetite.  Headache.  Low-grade fever. DIAGNOSIS  You might diagnose your own cold based on familiar symptoms, since most people get a cold 2 to 3 times a year. Your caregiver can confirm this based on your exam. Most importantly, your caregiver can check that your symptoms are not due to another disease such as strep throat, sinusitis, pneumonia, asthma, or epiglottitis. Blood tests, throat tests, and X-rays are not necessary to diagnose a common cold, but they may sometimes be helpful in excluding other more serious diseases. Your caregiver will decide if any further tests are required. RISKS AND COMPLICATIONS  You may be at risk for a more severe case of the common cold if you smoke cigarettes, have chronic heart disease (such as heart failure) or lung disease (such as asthma), or if you have a weakened immune system. The very young and very old are also at risk for more serious infections. Bacterial sinusitis, middle ear infections, and bacterial pneumonia can complicate the common cold. The common cold can worsen asthma and chronic obstructive pulmonary disease (COPD). Sometimes, these complications can require emergency medical care and may be life-threatening. PREVENTION  The best way to protect against getting a cold is to practice good hygiene. Avoid oral or hand contact with people with cold symptoms. Wash your hands often if contact occurs. There is no clear evidence that vitamin C, vitamin E, echinacea, or exercise reduces the chance of developing a cold. However, it is always recommended to get plenty of rest and practice good nutrition. TREATMENT  Treatment is directed at relieving symptoms. There is no cure. Antibiotics are not effective, because the infection is caused by a virus, not by bacteria. Treatment may include:  Increased fluid intake. Sports drinks offer valuable electrolytes, sugars, and fluids.  Breathing  heated mist or steam (vaporizer or shower).  Eating chicken soup or other clear broths, and maintaining good nutrition.  Getting plenty of rest.  Using gargles or lozenges for comfort.  Controlling fevers with ibuprofen or acetaminophen as directed by your caregiver.  Increasing usage of your inhaler if you have asthma. Zinc gel and zinc lozenges, taken in the first 24 hours of the common cold, can shorten the duration and lessen the severity of symptoms. Pain medicines may help with fever, muscle aches, and throat pain. A variety of non-prescription medicines are available to treat congestion and runny nose. Your caregiver can make recommendations and may suggest nasal or lung inhalers for other symptoms.  HOME CARE INSTRUCTIONS   Only take over-the-counter or prescription medicines for pain, discomfort, or fever as directed by your caregiver.  Use a warm mist humidifier or inhale steam from a shower to increase air moisture. This may keep secretions moist and make it easier to breathe.  Drink enough water and fluids to keep your urine clear or pale yellow.  Rest as needed.  Return to work when your temperature has returned to normal or as your caregiver advises. You may need to stay home longer to avoid infecting others. You  can also use a face mask and careful hand washing to prevent spread of the virus. SEEK MEDICAL CARE IF:   After the first few days, you feel you are getting worse rather than better.  You need your caregiver's advice about medicines to control symptoms.  You develop chills, worsening shortness of breath, or brown or red sputum. These may be signs of pneumonia.  You develop yellow or brown nasal discharge or pain in the face, especially when you bend forward. These may be signs of sinusitis.  You develop a fever, swollen neck glands, pain with swallowing, or white areas in the back of your throat. These may be signs of strep throat. SEEK IMMEDIATE MEDICAL CARE  IF:   You have a fever.  You develop severe or persistent headache, ear pain, sinus pain, or chest pain.  You develop wheezing, a prolonged cough, cough up blood, or have a change in your usual mucus (if you have chronic lung disease).  You develop sore muscles or a stiff neck. Document Released: 10/11/2000 Document Revised: 07/10/2011 Document Reviewed: 07/23/2013 [facility] Patient Information 2015 [place], [place]. This information is not intended to replace advice given to you by your health care provider. Make sure you discuss any questions you have with your health care provider.

## 2014-09-26 NOTE — Progress Notes (Signed)
Subjective:  This chart was scribed for David Ray, MD by [facility], medical scribe at [facility].The patient was seen in exam room 03 and the patient's care was started at 10:11 AM.   Patient ID: David Cannon, male    DOB: 07-03-1950, 64 y.o.   MRN: [id number] Chief Complaint  Patient presents with  . Sore Throat    and achy in the back of throat-sxs last 2 days    HPI HPI Comments: David Cannon is a 64 y.o. male who presents to [facility] complaining of sore throat onset three days ago. Tried cough drops which was helpful. He has not noticed any "spots" on his tonsils. Cough began today and congestion began in the exam room. He denies sick contacts. Pt works in [employer] [place] and work from home. He does have a history of strep throat and upper respiratory infections. He denies fever.  Patient Active Problem List   Diagnosis Date Noted  . Preventative health care 08/27/2013  . Plantar fasciitis 08/27/2013  . Sebaceous cyst 01/13/2013  . Erectile dysfunction 03/03/2011  . Accessory skin tags 11/27/2010  . [place], MILD 06/18/2009  . Other abnormal glucose 12/19/2007  . HYPERTENSION 12/19/2007  . RENAL DISEASE, CHRONIC, MILD 12/19/2007   Past Medical History  Diagnosis Date  . CRI (chronic renal insufficiency)   . Hypertension   . Other abnormal glucose    No past surgical history on file. No Known Allergies Prior to Admission medications   Medication Sig Start Date End Date Taking? Authorizing Provider  aspirin 81 MG EC tablet Take 81 mg by mouth daily.      Historical Provider, MD  glucose blood (ACCU-CHEK AVIVA PLUS) test strip 1 each by Other route daily. Patient not taking: Reported on 05/20/2014 05/08/14   Doe-Hyun R Shawna Orleans, DO  Lancets (ACCU-CHEK MULTICLIX) lancets 1 each by Other route daily. Patient not taking: Reported on 05/20/2014 11/05/12   Doe-Hyun R Shawna Orleans, DO  olmesartan-hydrochlorothiazide (BENICAR HCT) 20-12.5 MG per  tablet Take 1 tablet by mouth daily. Patient not taking: Reported on 05/20/2014 08/27/13   Doe-Hyun R Shawna Orleans, DO  simvastatin (ZOCOR) 10 MG tablet TAKE 1 TABLET BY MOUTH EVERY DAY AT 6 PM Patient not taking: Reported on 09/26/2014 04/13/14   Doe-Hyun R Shawna Orleans, DO  tadalafil (CIALIS) 10 MG tablet Take 1 tablet (10 mg total) by mouth daily as needed for erectile dysfunction. Patient not taking: Reported on 05/20/2014 01/13/13   Doe-Hyun Kyra Searles, DO   History   Social History  . Marital Status: Married    Spouse Name: N/A  . Number of Children: N/A  . Years of Education: N/A   Occupational History  . Not on file.   Social History Main Topics  . Smoking status: Never Smoker   . Smokeless tobacco: Never Used  . Alcohol Use: 0.0 oz/week    0 Standard drinks or equivalent per week     Comment: occ  . Drug Use: No  . Sexual Activity: Not on file   Other Topics Concern  . Not on file   Social History Narrative  Review of Systems  Constitutional: Negative for fever.  HENT: Positive for congestion and sore throat.   Respiratory: Positive for cough.      Objective:  BP 134/76 mmHg  Pulse 77  Temp(Src) 98.8 F (37.1 C) (Oral)  Ht 5' 11.75" (1.822 m)  Wt 224 lb 3.2 oz (101.696 kg)  BMI 30.63 kg/m2  SpO2 98% Physical Exam  Constitutional: He is oriented to person, place, and time. He appears well-developed and well-nourished. No distress.  HENT:  Head: Normocephalic and atraumatic.  Right Ear: Tympanic membrane, external ear and ear canal normal.  Left Ear: Tympanic membrane, external ear and ear canal normal.  Nose: No rhinorrhea.  Mouth/Throat: Mucous membranes are normal. Posterior oropharyngeal erythema present. No oropharyngeal exudate, posterior oropharyngeal edema or tonsillar abscesses.  Minimal posterior erythema.  Eyes: Conjunctivae are normal. Pupils are equal, round, and reactive to light.  Neck: Normal range of motion. Neck supple.  No lymphadenopathy.   Cardiovascular:  Normal rate, regular rhythm, normal heart sounds and intact distal pulses.   No murmur heard. Pulmonary/Chest: Effort normal and breath sounds normal. No respiratory distress. He has no wheezes. He has no rhonchi. He has no rales.  Abdominal: Soft. There is no tenderness.  Musculoskeletal: Normal range of motion.  Lymphadenopathy:    He has no cervical adenopathy.  Neurological: He is alert and oriented to person, place, and time.  Skin: Skin is warm and dry. No rash noted.  Psychiatric: He has a normal mood and affect. His behavior is normal.  Nursing note and vitals reviewed.    Assessment & Plan:   [name] is a 64 y.o. male Sore throat - Plan: Alum & Mag Hydroxide-Simeth (MAGIC MOUTHWASH W/LIDOCAINE) SOLN  Acute upper respiratory infection  - early URI/suspected viral pharyngitis. Discussed Centor criteria and with his current sx's and signs - less likely to have strep throat (+cough, no fever, no LAD).  He decided against strep testing at this time. Symptomatic care discussed with lozenges, antipyretics, and magic mouthwash if needed. rtc precautions discussed.   Meds ordered this encounter  Medications  . Alum & Mag Hydroxide-Simeth (MAGIC MOUTHWASH W/LIDOCAINE) SOLN    Sig: Take 5 mLs by mouth 4 (four) times daily as needed for mouth pain.    Dispense:  120 mL    Refill:  0    Ok to substitute ingredients per pharmacy usual "magic mouthwash" prep.   Patient Instructions  Saline nasal spray atleast 4 times per day if having nasal congestion, cepacol or other throat lozenge if needed for sore throat (I also prescribed magic mouthwash to gargle and spit if needed). Tylenol or motrin if needed. over the counter mucinex or mucinex DM if needed for cough, drink plenty of fluids.  Return to the clinic or go to the nearest emergency room if any of your symptoms worsen or new symptoms occur.  Sore Throat A sore throat is pain, burning, irritation, or scratchiness of the throat.  There is often pain or tenderness when swallowing or talking. A sore throat may be accompanied by other symptoms, such as coughing, sneezing, fever, and swollen neck glands. A sore throat is often the first sign of another sickness, such as a cold, flu, strep throat, or mononucleosis (commonly known as mono). Most sore throats go away without medical treatment. CAUSES  The most common causes of a sore throat include:  A viral infection, such as a cold, flu, or mono.  A bacterial infection, such as strep throat, tonsillitis, or whooping cough.  Seasonal allergies.  Dryness in the air.  Irritants, such as smoke or pollution.  Gastroesophageal reflux disease (GERD). HOME CARE INSTRUCTIONS   Only take over-the-counter medicines as directed by your caregiver.  Drink enough fluids to keep your urine clear or pale yellow.  Rest as needed.  Try using throat sprays, lozenges, or sucking  on hard candy to ease any pain (if older than 4 years or as directed).  Sip warm liquids, such as broth, herbal tea, or warm water with honey to relieve pain temporarily. You may also eat or drink cold or frozen liquids such as frozen ice pops.  Gargle with salt water (mix 1 tsp salt with 8 oz of water).  Do not smoke and avoid secondhand smoke.  Put a cool-mist humidifier in your bedroom at night to moisten the air. You can also turn on a hot shower and sit in the bathroom with the door closed for 5-10 minutes. SEEK IMMEDIATE MEDICAL CARE IF:  You have difficulty breathing.  You are unable to swallow fluids, soft foods, or your saliva.  You have increased swelling in the throat.  Your sore throat does not get better in 7 days.  You have nausea and vomiting.  You have a fever or persistent symptoms for more than 2-3 days.  You have a fever and your symptoms suddenly get worse. MAKE SURE YOU:   Understand these instructions.  Will watch your condition.  Will get help right away if you are  not doing well or get worse. Document Released: 05/25/2004 Document Revised: 04/03/2012 Document Reviewed: 12/24/2011 [facility] Patient Information 2015 [place], [place]. This information is not intended to replace advice given to you by your health care provider. Make sure you discuss any questions you have with your health care provider.  Upper Respiratory Infection, Adult An upper respiratory infection (URI) is also sometimes known as the common cold. The upper respiratory tract includes the nose, sinuses, throat, trachea, and bronchi. Bronchi are the airways leading to the lungs. Most people improve within 1 week, but symptoms can last up to 2 weeks. A residual cough may last even longer.  CAUSES Many different viruses can infect the tissues lining the upper respiratory tract. The tissues become irritated and inflamed and often become very moist. Mucus production is also common. A cold is contagious. You can easily spread the virus to others by oral contact. This includes kissing, sharing a glass, coughing, or sneezing. Touching your mouth or nose and then touching a surface, which is then touched by another person, can also spread the virus. SYMPTOMS  Symptoms typically develop 1 to 3 days after you come in contact with a cold virus. Symptoms vary from person to person. They may include:  Runny nose.  Sneezing.  Nasal congestion.  Sinus irritation.  Sore throat.  Loss of voice (laryngitis).  Cough.  Fatigue.  Muscle aches.  Loss of appetite.  Headache.  Low-grade fever. DIAGNOSIS  You might diagnose your own cold based on familiar symptoms, since most people get a cold 2 to 3 times a year. Your caregiver can confirm this based on your exam. Most importantly, your caregiver can check that your symptoms are not due to another disease such as strep throat, sinusitis, pneumonia, asthma, or epiglottitis. Blood tests, throat tests, and X-rays are not necessary to diagnose a common  cold, but they may sometimes be helpful in excluding other more serious diseases. Your caregiver will decide if any further tests are required. RISKS AND COMPLICATIONS  You may be at risk for a more severe case of the common cold if you smoke cigarettes, have chronic heart disease (such as heart failure) or lung disease (such as asthma), or if you have a weakened immune system. The very young and very old are also at risk for more serious infections. Bacterial sinusitis, middle  ear infections, and bacterial pneumonia can complicate the common cold. The common cold can worsen asthma and chronic obstructive pulmonary disease (COPD). Sometimes, these complications can require emergency medical care and may be life-threatening. PREVENTION  The best way to protect against getting a cold is to practice good hygiene. Avoid oral or hand contact with people with cold symptoms. Wash your hands often if contact occurs. There is no clear evidence that vitamin C, vitamin E, echinacea, or exercise reduces the chance of developing a cold. However, it is always recommended to get plenty of rest and practice good nutrition. TREATMENT  Treatment is directed at relieving symptoms. There is no cure. Antibiotics are not effective, because the infection is caused by a virus, not by bacteria. Treatment may include:  Increased fluid intake. Sports drinks offer valuable electrolytes, sugars, and fluids.  Breathing heated mist or steam (vaporizer or shower).  Eating chicken soup or other clear broths, and maintaining good nutrition.  Getting plenty of rest.  Using gargles or lozenges for comfort.  Controlling fevers with ibuprofen or acetaminophen as directed by your caregiver.  Increasing usage of your inhaler if you have asthma. Zinc gel and zinc lozenges, taken in the first 24 hours of the common cold, can shorten the duration and lessen the severity of symptoms. Pain medicines may help with fever, muscle aches, and  throat pain. A variety of non-prescription medicines are available to treat congestion and runny nose. Your caregiver can make recommendations and may suggest nasal or lung inhalers for other symptoms.  HOME CARE INSTRUCTIONS   Only take over-the-counter or prescription medicines for pain, discomfort, or fever as directed by your caregiver.  Use a warm mist humidifier or inhale steam from a shower to increase air moisture. This may keep secretions moist and make it easier to breathe.  Drink enough water and fluids to keep your urine clear or pale yellow.  Rest as needed.  Return to work when your temperature has returned to normal or as your caregiver advises. You may need to stay home longer to avoid infecting others. You can also use a face mask and careful hand washing to prevent spread of the virus. SEEK MEDICAL CARE IF:   After the first few days, you feel you are getting worse rather than better.  You need your caregiver's advice about medicines to control symptoms.  You develop chills, worsening shortness of breath, or brown or red sputum. These may be signs of pneumonia.  You develop yellow or brown nasal discharge or pain in the face, especially when you bend forward. These may be signs of sinusitis.  You develop a fever, swollen neck glands, pain with swallowing, or white areas in the back of your throat. These may be signs of strep throat. SEEK IMMEDIATE MEDICAL CARE IF:   You have a fever.  You develop severe or persistent headache, ear pain, sinus pain, or chest pain.  You develop wheezing, a prolonged cough, cough up blood, or have a change in your usual mucus (if you have chronic lung disease).  You develop sore muscles or a stiff neck. Document Released: 10/11/2000 Document Revised: 07/10/2011 Document Reviewed: 07/23/2013 [facility] Patient Information 2015 [place], [place]. This information is not intended to replace advice given to you by your health care provider.  Make sure you discuss any questions you have with your health care provider.     I personally performed the services described in this documentation, which was scribed in my presence. The recorded information  has been reviewed and considered, and addended by me as needed.

## 2014-10-01 ENCOUNTER — Telehealth: Payer: Self-pay | Admitting: Family Medicine

## 2014-10-01 NOTE — Telephone Encounter (Signed)
Assessment & Plan:   David Cannon is a 64 y.o. male Sore throat - Plan: Alum & Mag Hydroxide-Simeth (MAGIC MOUTHWASH W/LIDOCAINE) SOLN  Acute upper respiratory infection - early URI/suspected viral pharyngitis. Discussed Centor criteria and with his current sx's and signs - less likely to have strep throat (+cough, no fever, no LAD). He decided against strep testing at this time. Symptomatic care discussed with lozenges, antipyretics, and magic mouthwash if needed. rtc precautions discussed.         Please advise.

## 2014-10-01 NOTE — Telephone Encounter (Signed)
Patient called to follow up on the status of his refill request. He was told that the medication needs doctor approval. Patient states that he's going out of town tomorrow.

## 2014-10-01 NOTE — Telephone Encounter (Signed)
Patient states that he has a cough. He is requesting Hydrocodone. Patient was seen on 09/26/2014 by Dr. Carlota Raspberry.   [phone]

## 2014-10-02 ENCOUNTER — Other Ambulatory Visit: Payer: Self-pay | Admitting: Physician Assistant

## 2014-10-02 DIAGNOSIS — R059 Cough, unspecified: Secondary | ICD-10-CM

## 2014-10-02 DIAGNOSIS — R05 Cough: Secondary | ICD-10-CM

## 2014-10-02 MED ORDER — HYDROCODONE-HOMATROPINE 5-1.5 MG/5ML PO SYRP: 5.0000 mL | ORAL_SOLUTION | Freq: Three times a day (TID) | ORAL | Status: DC | PRN

## 2014-10-02 NOTE — Telephone Encounter (Signed)
Done. At the front desk for pt pickup. Please call patient. Philis Fendt, MS, PA-C   8:26 AM, 10/02/2014

## 2014-10-02 NOTE — Progress Notes (Signed)
Prescribing Hycodan cough syrup per Dr. Vonna Kotyk direction.  Philis Fendt, MS, PA-C   8:24 AM, 10/02/2014

## 2014-10-02 NOTE — Telephone Encounter (Signed)
I do not mind prescribing some hycodan cough syrup if needed at night, but will not be in the office until Monday.  Will route this message to PA pool to Rx Hycocan in my absence if possible for his cough. Thanks.

## 2014-11-21 DIAGNOSIS — J40 Bronchitis, not specified as acute or chronic: Secondary | ICD-10-CM | POA: Diagnosis not present

## 2014-11-21 DIAGNOSIS — R0989 Other specified symptoms and signs involving the circulatory and respiratory systems: Secondary | ICD-10-CM | POA: Diagnosis not present

## 2014-11-22 ENCOUNTER — Ambulatory Visit (INDEPENDENT_AMBULATORY_CARE_PROVIDER_SITE_OTHER): Payer: Medicare Other | Admitting: Family Medicine

## 2014-11-22 VITALS — BP 118/72 | HR 87 | Temp 99.1°F | Resp 18 | Ht 73.0 in | Wt 226.0 lb

## 2014-11-22 DIAGNOSIS — J206 Acute bronchitis due to rhinovirus: Secondary | ICD-10-CM

## 2014-11-22 MED ORDER — PREDNISONE 20 MG PO TABS: ORAL_TABLET | ORAL | Status: DC

## 2014-11-22 MED ORDER — ALBUTEROL SULFATE HFA 108 (90 BASE) MCG/ACT IN AERS: 2.0000 | INHALATION_SPRAY | Freq: Four times a day (QID) | RESPIRATORY_TRACT | Status: DC | PRN

## 2014-11-22 MED ORDER — HYDROCOD POLST-CPM POLST ER 10-8 MG/5ML PO SUER: 5.0000 mL | Freq: Two times a day (BID) | ORAL | Status: DC | PRN

## 2014-11-22 NOTE — Progress Notes (Signed)
This chart was scribed for Robyn Haber, MD by Moises Blood, medical scribe at [facility].The patient was seen in exam room 14 and the patient's care was started at 3:03 PM.  Patient ID: David Cannon MRN: [id number], DOB: 09-16-1950, 64 y.o. Date of Encounter: 11/22/2014  Primary Physician: Drema Pry, DO  Chief Complaint:  Chief Complaint  Patient presents with   Cough    pt is not pleased with medication given by previous physician wants something stronger    Headache    HPI:  David Cannon is a 64 y.o. male who presents to [facility] complaining of cough for the past 4-5 days.  He was previously seen at a clinic in [place] [employer]. He states that he was wheezing when he went in, and they gave him cough medicine and z-pak. He has been taking mucinex, but to no relief. He's coughing so much that it's giving him a headache. Cough medicine was a codeine product and patient feels that he needs a hydrocodone product.  He used to have an inhaler. He never thought he needed it and used it very sparingly. He has never been officially told having asthma. Nevertheless, patient has had similar problems before with wheezing for which she was prescribed the inhaler. No longer has this.  Past Medical History  Diagnosis Date   CRI (chronic renal insufficiency)    Hypertension    Other abnormal glucose      Home Meds: Prior to Admission medications   Medication Sig Start Date End Date Taking? Authorizing Provider  Alum & Mag Hydroxide-Simeth (MAGIC MOUTHWASH W/LIDOCAINE) SOLN Take 5 mLs by mouth 4 (four) times daily as needed for mouth pain. 09/26/14  Yes Wendie Agreste, MD  azithromycin (ZITHROMAX) 250 MG tablet Take by mouth daily.   Yes Historical Provider, MD  guaiFENesin-codeine (ROBITUSSIN AC) 100-10 MG/5ML syrup Take 5 mLs by mouth 3 (three) times daily as needed for cough.   Yes Historical Provider, MD  aspirin 81 MG EC tablet Take 81 mg  by mouth daily.      Historical Provider, MD  HYDROcodone-homatropine (HYCODAN) 5-1.5 MG/5ML syrup Take 5 mLs by mouth every 8 (eight) hours as needed for cough. Patient not taking: Reported on 11/22/2014 10/02/14   Tereasa Coop, PA-C    Allergies: No Known Allergies  History   Social History   Marital Status: Married    Spouse Name: N/A   Number of Children: N/A   Years of Education: N/A   Occupational History   Not on file.   Social History Main Topics   Smoking status: Never Smoker    Smokeless tobacco: Never Used   Alcohol Use: 0.0 oz/week    0 Standard drinks or equivalent per week     Comment: occ   Drug Use: No   Sexual Activity: Not on file   Other Topics Concern   Not on file   Social History Narrative     Review of Systems: Constitutional: negative for chills, fever, night sweats, weight changes, or fatigue  HEENT: negative for vision changes, hearing loss, congestion, rhinorrhea, ST, epistaxis, or sinus pressure Cardiovascular: negative for chest pain or palpitations Respiratory: negative for hemoptysis, shortness of breath; positive for cough, wheezing  Abdominal: negative for abdominal pain, nausea, vomiting, diarrhea, or constipation Dermatological: negative for rash Neurologic: negative for dizziness, or syncope; positive for headache All other systems reviewed and are otherwise negative with the exception to those above and  in the HPI.  Physical Exam: Blood pressure 118/72, pulse 87, temperature 99.1 F (37.3 C), temperature source Oral, resp. rate 18, height 6\' 1"  (1.854 m), weight 226 lb (102.513 kg), SpO2 96 %., Body mass index is 29.82 kg/(m^2). General: Well developed, well nourished, in no acute distress. Head: Normocephalic, atraumatic, eyes without discharge, sclera non-icteric, nares are without discharge. Bilateral auditory canals clear, TM's are without perforation, pearly grey and translucent with reflective cone of light  bilaterally. Oral cavity moist, posterior pharynx without exudate, erythema, peritonsillar abscess, or post nasal drip.  Neck: Supple. No thyromegaly. Full ROM. No lymphadenopathy. Lungs: Clear bilaterally to auscultation without rales, or rhonchi. Breathing is unlabored. Has expiratory wheezes bilaterally Heart: RRR with S1 S2. No murmurs, rubs, or gallops appreciated. Abdomen: Soft, non-tender, non-distended with normoactive bowel sounds. No hepatomegaly. No rebound/guarding. No obvious abdominal masses. Msk:  Strength and tone normal for age. Extremities/Skin: Warm and dry. No clubbing or cyanosis. No edema. No rashes or suspicious lesions. Neuro: Alert and oriented X 3. Moves all extremities spontaneously. Gait is normal. CNII-XII grossly in tact. Psych:  Responds to questions appropriately with a normal affect.   Labs: none  ASSESSMENT AND PLAN:  64 y.o. year old male with  This chart was scribed in my presence and reviewed by me personally.    ICD-9-CM ICD-10-CM   1. Acute bronchitis due to Rhinovirus 466.0 J20.6 chlorpheniramine-HYDROcodone (TUSSIONEX PENNKINETIC ER) 10-8 MG/5ML SUER   079.3  albuterol (PROVENTIL HFA;VENTOLIN HFA) 108 (90 BASE) MCG/ACT inhaler     predniSONE (DELTASONE) 20 MG tablet    Signed, Robyn Haber, MD 11/22/2014 3:03 PM

## 2014-11-22 NOTE — Patient Instructions (Signed)

## 2014-11-23 ENCOUNTER — Telehealth: Payer: Self-pay

## 2014-11-23 DIAGNOSIS — R053 Chronic cough: Secondary | ICD-10-CM

## 2014-11-23 DIAGNOSIS — R05 Cough: Secondary | ICD-10-CM

## 2014-11-23 NOTE — Telephone Encounter (Signed)
Left message to call back  

## 2014-11-23 NOTE — Telephone Encounter (Signed)
Pt is needing to talk with someone today about a different inhaler it is tooo expensive

## 2014-11-23 NOTE — Telephone Encounter (Signed)
albuterol (PROVENTIL HFA;VENTOLIN HFA) 108 (90 BASE) MCG/ACT inhaler [[id number]]

## 2014-12-11 ENCOUNTER — Encounter: Payer: Self-pay | Admitting: Internal Medicine

## 2014-12-11 ENCOUNTER — Ambulatory Visit (INDEPENDENT_AMBULATORY_CARE_PROVIDER_SITE_OTHER): Payer: Medicare Other | Admitting: Internal Medicine

## 2014-12-11 VITALS — BP 130/70 | HR 96 | Temp 99.2°F | Wt 226.0 lb

## 2014-12-11 DIAGNOSIS — R05 Cough: Secondary | ICD-10-CM | POA: Diagnosis not present

## 2014-12-11 DIAGNOSIS — R7309 Other abnormal glucose: Secondary | ICD-10-CM | POA: Diagnosis not present

## 2014-12-11 DIAGNOSIS — R059 Cough, unspecified: Secondary | ICD-10-CM

## 2014-12-11 DIAGNOSIS — N182 Chronic kidney disease, stage 2 (mild): Secondary | ICD-10-CM

## 2014-12-11 MED ORDER — BENZONATATE 100 MG PO CAPS: 100.0000 mg | ORAL_CAPSULE | Freq: Three times a day (TID) | ORAL | Status: DC | PRN

## 2014-12-11 MED ORDER — HYDROCODONE-HOMATROPINE 5-1.5 MG/5ML PO SYRP: 5.0000 mL | ORAL_SOLUTION | Freq: Three times a day (TID) | ORAL | Status: DC | PRN

## 2014-12-11 NOTE — Patient Instructions (Addendum)
Please return in 3 months for complete physical exam Please complete the following lab tests before your next follow up appointment: FLP, LFTs, TSH - 272.4 PSA - V70 Take omeprazole 20 mg (over the counter) 15 min before breakfast once daily

## 2014-12-11 NOTE — Progress Notes (Signed)
Pre visit review using our clinic review tool, if applicable. No additional management support is needed unless otherwise documented below in the visit note. 

## 2014-12-11 NOTE — Progress Notes (Signed)
Subjective:    Patient ID: David Cannon, male    DOB: March 15, 1951, 64 y.o.   MRN: [id number]  HPI  64 year old African-American male with history of mild chronic relative insufficiency and abnormal glucose for routine follow-up.  Interval medical history-[place]. He was diagnosed with possible bronchitis. Patient was treated with Z-Pak in May 2016.  He felt to have viral URI in July and he was treated with cough medication.  In general, his cough has improved but not completely resolved.  Cough is always worse when he lays down.  He denies sinus pressure or pain.  He was never a smoker.  Wt Readings from Last 3 Encounters:  12/11/14 226 lb (102.513 kg)  11/22/14 226 lb (102.513 kg)  09/26/14 224 lb 3.2 oz (101.696 kg)   Weight is stable.  He is due for labs to follow CRI and abnormal glucose.  Review of Systems Negative for chest tightness or shortness of breath    Past Medical History  Diagnosis Date  . CRI (chronic renal insufficiency)   . Hypertension   . Other abnormal glucose     Social History   Social History  . Marital Status: Married    Spouse Name: N/A  . Number of Children: N/A  . Years of Education: N/A   Occupational History  . Not on file.   Social History Main Topics  . Smoking status: Never Smoker   . Smokeless tobacco: Never Used  . Alcohol Use: 0.0 oz/week    0 Standard drinks or equivalent per week     Comment: occ  . Drug Use: No  . Sexual Activity: Not on file   Other Topics Concern  . Not on file   Social History Narrative    No past surgical history on file.  Family History  Problem Relation Age of Onset  . Cancer Mother     lung  . Cancer Father     prostate  . Cancer Maternal Grandfather     colon  . Coronary artery disease Neg Hx     No Known Allergies  Current Outpatient Prescriptions on File Prior to Visit  Medication Sig  Dispense Refill  . albuterol (PROVENTIL HFA;VENTOLIN HFA) 108 (90 BASE) MCG/ACT inhaler Inhale 2 puffs into the lungs every 6 (six) hours as needed for wheezing or shortness of breath. 1 Inhaler 0  . Alum & Mag Hydroxide-Simeth (MAGIC MOUTHWASH W/LIDOCAINE) SOLN Take 5 mLs by mouth 4 (four) times daily as needed for mouth pain. 120 mL 0  . aspirin 81 MG EC tablet Take 81 mg by mouth daily.       No current facility-administered medications on file prior to visit.    BP 130/70 mmHg  Pulse 96  Temp(Src) 99.2 F (37.3 C) (Oral)  Wt 226 lb (102.513 kg)  SpO2 98%    Objective:   Physical Exam  Constitutional: He is oriented to person, place, and time. He appears well-developed and well-nourished.  HENT:  Head: Normocephalic and atraumatic.  Cardiovascular: Normal rate, regular rhythm and normal heart sounds.   Pulmonary/Chest: Effort normal and breath sounds normal. He has no wheezes.  Abdominal: Soft. Bowel sounds are normal. He exhibits no mass.  Musculoskeletal: He exhibits no edema.  Neurological: He is alert and oriented to person, place, and time. No cranial nerve deficit.  Skin: Skin is warm and dry.  Psychiatric: He  has a normal mood and affect. His behavior is normal.    Office spirometry performed and reviewed    Assessment & Plan:   1. Chronic cough 2. Abnormal glucose 3. Mild chronic renal insufficiency  64 year old [place] American male was treated for upper respiratory infection/possible bronchitis in May and July 2016. He still has mild residual cough. He does not have any chest tightness, wheezing or shortness of breath. I doubt he has asthma.  His spirometry is normal.  Arrange chest xray.   His cough triggered by laying down. We discussed possibility that silent reflux may be precipitating cause. He will try taking over-the-counter omeprazole 20 mg once daily in the morning 15 minutes before his morning meal along with anti reflux measures. .  Also try tessalon  at bedtime.  If nocturnal cough severe, he can use rx for Hycodan.  Patient to contact our office if cough does not improve within the next 1-2 weeks.   Monitor BMET and A1c.  Reevaluate in 6 weeks.

## 2014-12-12 LAB — HEMOGLOBIN A1C
HEMOGLOBIN A1C: 6.4 % — AB (ref <5.7)
MEAN PLASMA GLUCOSE: 137 mg/dL — AB (ref <117)

## 2014-12-12 LAB — BASIC METABOLIC PANEL
BUN: 15 mg/dL (ref 7–25)
CHLORIDE: 110 mmol/L (ref 98–110)
CO2: 29 mmol/L (ref 20–31)
Calcium: 9.6 mg/dL (ref 8.6–10.3)
Creat: 1.54 mg/dL — ABNORMAL HIGH (ref 0.70–1.25)
Glucose, Bld: 97 mg/dL (ref 65–99)
POTASSIUM: 4.9 mmol/L (ref 3.5–5.3)
Sodium: 146 mmol/L (ref 135–146)

## 2014-12-14 NOTE — Telephone Encounter (Signed)
Left message on machine for patient to return our call. Order placed,

## 2014-12-14 NOTE — Telephone Encounter (Signed)
-----   Message from [place], DO sent at 12/11/2014  5:28 PM EDT ----- Please call patient on Monday and schedule CXR PA/LAT regarding chronic cough.  Thanks.  RY

## 2014-12-17 ENCOUNTER — Ambulatory Visit (INDEPENDENT_AMBULATORY_CARE_PROVIDER_SITE_OTHER)
Admission: RE | Admit: 2014-12-17 | Discharge: 2014-12-17 | Disposition: A | Discharge status: Home / Self Care | Payer: Medicare Other | Source: Ambulatory Visit | Attending: Internal Medicine | Admitting: Internal Medicine

## 2014-12-17 DIAGNOSIS — R053 Chronic cough: Secondary | ICD-10-CM

## 2014-12-17 DIAGNOSIS — R05 Cough: Secondary | ICD-10-CM

## 2014-12-30 ENCOUNTER — Encounter: Payer: Self-pay | Admitting: Internal Medicine

## 2015-02-18 ENCOUNTER — Other Ambulatory Visit: Payer: Medicare Other

## 2015-02-18 ENCOUNTER — Ambulatory Visit (INDEPENDENT_AMBULATORY_CARE_PROVIDER_SITE_OTHER): Payer: Medicare Other | Admitting: Adult Health

## 2015-02-18 ENCOUNTER — Encounter: Payer: Self-pay | Admitting: Adult Health

## 2015-02-18 DIAGNOSIS — N182 Chronic kidney disease, stage 2 (mild): Secondary | ICD-10-CM | POA: Diagnosis not present

## 2015-02-18 DIAGNOSIS — R7309 Other abnormal glucose: Secondary | ICD-10-CM | POA: Diagnosis not present

## 2015-02-18 LAB — RENAL FUNCTION PANEL
ALBUMIN: 4.3 g/dL (ref 3.5–5.2)
BUN: 16 mg/dL (ref 6–23)
CO2: 32 meq/L (ref 19–32)
Calcium: 9.6 mg/dL (ref 8.4–10.5)
Chloride: 108 mEq/L (ref 96–112)
Creatinine, Ser: 1.31 mg/dL (ref 0.40–1.50)
GFR: 70.33 mL/min (ref >60.00)
Glucose, Bld: 96 mg/dL (ref 70–99)
Phosphorus: 3.3 mg/dL (ref 2.3–4.6)
Potassium: 4.8 mEq/L (ref 3.5–5.1)
Sodium: 145 mEq/L (ref 135–145)

## 2015-02-18 NOTE — Progress Notes (Signed)
Subjective:    Patient ID: David Cannon, male    DOB: 1950/10/27, 65 y.o.   MRN: [id number]  HPI   Patient was seen during Epic downtime and limited information was available at the time  64 year old male, patient of Dr.Yoo. I am seeing this patient and Dr.Yoos absence. He presents to the office today for follow-up regarding his mild chronic renal insufficiency. He was tested back in August 2016 at which time he had a creatinine level of 1.54. He is wanting to know if his chronic renal insufficiency has improved ,due to insurance reasons. He denies any type of complications from  chronic renal insufficiency.  His A1c was also tested in August 2016 which time he had A1c of 6.4.  He has no other issues that he would like to talk about today   Review of Systems  Constitutional: Negative.   HENT: Negative.   Respiratory: Negative.   Cardiovascular: Negative.   Genitourinary: Negative.   Skin: Negative.   Neurological: Negative.   All other systems reviewed and are negative.  Past Medical History  Diagnosis Date  . CRI (chronic renal insufficiency)   . Hypertension   . Other abnormal glucose     Social History   Social History  . Marital Status: Married    Spouse Name: N/A  . Number of Children: N/A  . Years of Education: N/A   Occupational History  . Not on file.   Social History Main Topics  . Smoking status: Never Smoker   . Smokeless tobacco: Never Used  . Alcohol Use: 0.0 oz/week    0 Standard drinks or equivalent per week     Comment: occ  . Drug Use: No  . Sexual Activity: Not on file   Other Topics Concern  . Not on file   Social History Narrative    No past surgical history on file.  Family History  Problem Relation Age of Onset  . Cancer Mother     lung  . Cancer Father     prostate  . Cancer Maternal Grandfather     colon  . Coronary artery disease Neg Hx     No Known Allergies  Current Outpatient Prescriptions on File Prior to Visit    Medication Sig Dispense Refill  . albuterol (PROVENTIL HFA;VENTOLIN HFA) 108 (90 BASE) MCG/ACT inhaler Inhale 2 puffs into the lungs every 6 (six) hours as needed for wheezing or shortness of breath. 1 Inhaler 0  . Alum & Mag Hydroxide-Simeth (MAGIC MOUTHWASH W/LIDOCAINE) SOLN Take 5 mLs by mouth 4 (four) times daily as needed for mouth pain. 120 mL 0  . aspirin 81 MG EC tablet Take 81 mg by mouth daily.      . benzonatate (TESSALON) 100 MG capsule Take 1 capsule (100 mg total) by mouth 3 (three) times daily as needed for cough. 30 capsule 0  . HYDROcodone-homatropine (HYCODAN) 5-1.5 MG/5ML syrup Take 5 mLs by mouth every 8 (eight) hours as needed for cough. 120 mL 0   No current facility-administered medications on file prior to visit.    There were no vitals taken for this visit.       Objective:   Physical Exam  Constitutional: He is oriented to person, place, and time. He appears well-developed and well-nourished. No distress.  Cardiovascular: Normal rate, regular rhythm, normal heart sounds and intact distal pulses.  Exam reveals no gallop and no friction rub.   No murmur heard. Pulmonary/Chest: Effort normal and breath sounds normal.  No respiratory distress. He has no wheezes. He has no rales. He exhibits no tenderness.  Musculoskeletal: Normal range of motion. He exhibits no edema or tenderness.  Neurological: He is alert and oriented to person, place, and time.  Skin: Skin is warm and dry. No rash noted. He is not diaphoretic. No erythema. No pallor.  Psychiatric: He has a normal mood and affect. His behavior is normal. Judgment and thought content normal.  Nursing note and vitals reviewed.      Assessment & Plan:  1. RENAL DISEASE, CHRONIC, MILD - Renal function panel -results showed that his renal insufficiency has resolved. Currently his creatinine level is 1.31, BUN is 16 and his GFR is 70.33  2. Elevated glucose - patient if another A1c in one month -Stressed the  importance of diet and exercise the prevention of diabetes

## 2015-02-19 ENCOUNTER — Telehealth: Payer: Self-pay | Admitting: Adult Health

## 2015-02-19 NOTE — Telephone Encounter (Signed)
Left VM with informing to call back regarding lab work

## 2015-04-12 ENCOUNTER — Encounter: Payer: Self-pay | Admitting: Internal Medicine

## 2015-04-27 DIAGNOSIS — E119 Type 2 diabetes mellitus without complications: Secondary | ICD-10-CM | POA: Diagnosis not present

## 2015-04-27 LAB — HM DIABETES EYE EXAM

## 2015-04-30 ENCOUNTER — Ambulatory Visit (INDEPENDENT_AMBULATORY_CARE_PROVIDER_SITE_OTHER): Payer: Medicare Other | Admitting: Family Medicine

## 2015-04-30 VITALS — BP 132/80 | HR 82 | Temp 98.8°F | Resp 17 | Ht 71.5 in | Wt 225.0 lb

## 2015-04-30 DIAGNOSIS — J209 Acute bronchitis, unspecified: Secondary | ICD-10-CM

## 2015-04-30 MED ORDER — HYDROCOD POLST-CPM POLST ER 10-8 MG/5ML PO SUER: 5.0000 mL | Freq: Two times a day (BID) | ORAL | Status: DC | PRN

## 2015-04-30 MED ORDER — GUAIFENESIN ER 1200 MG PO TB12: 1.0000 | ORAL_TABLET | Freq: Two times a day (BID) | ORAL | Status: DC | PRN

## 2015-04-30 NOTE — Progress Notes (Signed)
Subjective:    Patient ID: David Cannon, male    DOB: 1950/12/07, 64 y.o.   MRN: [id number] By signing my name below, I, Zola Button, attest that this documentation has been prepared under the direction and in the presence of Delman Cheadle, MD.  Electronically Signed: Zola Button, Medical Scribe. 04/30/2015. 2:21 PM.  Chief Complaint  Patient presents with  . Cough    HPI HPI Comments: David Cannon is a 64 y.o. male who presents to the [facility] complaining of gradual onset, productive cough of clear sputum that started 3 days ago. Patient reports having associated congestion, sinus pressure in both temples, headache, and itchy throat. He has been taking Mucinex for his symptoms. Patient denies chest pain, SOB, fever and chills. He also denies history of asthma and smoking. He has been drinking fluids. Patient requests Hycodan for his symptoms, noting it has worked well for him in the past.  Past Medical History  Diagnosis Date  . CRI (chronic renal insufficiency)   . Hypertension   . Other abnormal glucose    Current Outpatient Prescriptions on File Prior to Visit  Medication Sig Dispense Refill  . albuterol (PROVENTIL HFA;VENTOLIN HFA) 108 (90 BASE) MCG/ACT inhaler Inhale 2 puffs into the lungs every 6 (six) hours as needed for wheezing or shortness of breath. 1 Inhaler 0  . aspirin 81 MG EC tablet Take 81 mg by mouth daily.       No current facility-administered medications on file prior to visit.   No Known Allergies   Review of Systems  Constitutional: Negative for fever and chills.  HENT: Positive for congestion, sinus pressure and sore throat.   Respiratory: Positive for cough. Negative for shortness of breath.   Cardiovascular: Negative for chest pain.  Neurological: Positive for headaches.       Objective:  BP 132/80 mmHg  Pulse 82  Temp(Src) 98.8 F (37.1 C) (Oral)  Resp 17  Ht 5' 11.5" (1.816 m)  Wt 225 lb (102.059 kg)  BMI 30.95 kg/m2   SpO2 99%  Physical Exam  Constitutional: He is oriented to person, place, and time. He appears well-developed and well-nourished. No distress.  HENT:  Head: Normocephalic and atraumatic.  Right Ear: Tympanic membrane is injected and retracted.  Left Ear: Tympanic membrane is retracted. Tympanic membrane is not injected.  Nose: Mucosal edema and rhinorrhea present.  Mouth/Throat: Posterior oropharyngeal erythema present. No oropharyngeal exudate.  Oropharynx with mild erythema.   Eyes: Pupils are equal, round, and reactive to light.  Neck: Neck supple. No thyromegaly present.  Normal thyroid.  Cardiovascular: Normal rate, regular rhythm, S1 normal, S2 normal and normal heart sounds.   No murmur heard. Pulmonary/Chest: Effort normal and breath sounds normal. No respiratory distress. He has no wheezes. He has no rales.  Clear to auscultation bilaterally. Good air movement.  Musculoskeletal: He exhibits no edema.  Lymphadenopathy:    He has no cervical adenopathy.  Neurological: He is alert and oriented to person, place, and time. No cranial nerve deficit.  Skin: Skin is warm and dry. No rash noted.  Psychiatric: He has a normal mood and affect. His behavior is normal.  Nursing note and vitals reviewed.         Assessment & Plan:   1. Acute bronchitis, unspecified organism     Meds ordered this encounter  Medications  . chlorpheniramine-HYDROcodone (TUSSIONEX PENNKINETIC ER) 10-8 MG/5ML SUER    Sig: Take 5 mLs by mouth every 12 (twelve)  hours as needed.    Dispense:  120 mL    Refill:  0  . Guaifenesin (MUCINEX MAXIMUM STRENGTH) 1200 MG TB12    Sig: Take 1 tablet (1,200 mg total) by mouth every 12 (twelve) hours as needed.    Dispense:  14 tablet    Refill:  1    I personally performed the services described in this documentation, which was scribed in my presence. The recorded information has been reviewed and considered, and addended by me as needed.  Delman Cheadle, MD  [facility]

## 2015-04-30 NOTE — Patient Instructions (Signed)

## 2015-05-05 ENCOUNTER — Encounter: Payer: Self-pay | Admitting: Internal Medicine

## 2015-05-24 ENCOUNTER — Encounter: Payer: Self-pay | Admitting: Internal Medicine

## 2015-06-08 DIAGNOSIS — H18893 Other specified disorders of cornea, bilateral: Secondary | ICD-10-CM | POA: Diagnosis not present

## 2015-06-08 DIAGNOSIS — H40053 Ocular hypertension, bilateral: Secondary | ICD-10-CM | POA: Diagnosis not present

## 2015-06-08 DIAGNOSIS — H40013 Open angle with borderline findings, low risk, bilateral: Secondary | ICD-10-CM | POA: Diagnosis not present

## 2015-06-08 DIAGNOSIS — H40033 Anatomical narrow angle, bilateral: Secondary | ICD-10-CM | POA: Diagnosis not present

## 2015-06-08 DIAGNOSIS — H21233 Degeneration of iris (pigmentary), bilateral: Secondary | ICD-10-CM | POA: Diagnosis not present

## 2015-06-14 ENCOUNTER — Ambulatory Visit (AMBULATORY_SURGERY_CENTER): Payer: Self-pay

## 2015-06-14 VITALS — Ht 72.5 in | Wt 231.4 lb

## 2015-06-14 DIAGNOSIS — Z860100 Personal history of colon polyps, unspecified: Secondary | ICD-10-CM

## 2015-06-14 NOTE — Progress Notes (Signed)
No allergies to eggs or soy No past problems with anesthesia No diet/weight loss meds No home oxygen  Has email and internet [email or web address]

## 2015-06-28 ENCOUNTER — Encounter: Payer: Self-pay | Admitting: Internal Medicine

## 2015-06-28 ENCOUNTER — Ambulatory Visit (AMBULATORY_SURGERY_CENTER): Payer: Medicare Other | Admitting: Internal Medicine

## 2015-06-28 ENCOUNTER — Telehealth: Payer: Self-pay | Admitting: Internal Medicine

## 2015-06-28 VITALS — BP 117/81 | HR 60 | Temp 97.3°F | Resp 17 | Ht 72.0 in | Wt 231.0 lb

## 2015-06-28 DIAGNOSIS — Z86010 Personal history of colonic polyps: Secondary | ICD-10-CM | POA: Diagnosis not present

## 2015-06-28 DIAGNOSIS — D123 Benign neoplasm of transverse colon: Secondary | ICD-10-CM

## 2015-06-28 MED ORDER — SODIUM CHLORIDE 0.9 % IV SOLN: 500.0000 mL | INTRAVENOUS | Status: DC

## 2015-06-28 NOTE — Patient Instructions (Addendum)
I found and removed 2 small polyps that look benign. You also have a condition called diverticulosis - common and not usually a problem. Please read the handout provided.  I will let you know pathology results and when to have another routine colonoscopy by mail.  I appreciate the opportunity to care for you. Gatha Mayer, MD, FACG  YOU HAD AN ENDOSCOPIC PROCEDURE TODAY AT [facility]:   Refer to the procedure report that was given to you for any specific questions about what was found during the examination.  If the procedure report does not answer your questions, please call your gastroenterologist to clarify.  If you requested that your care partner not be given the details of your procedure findings, then the procedure report has been included in a sealed envelope for you to review at your convenience later.  YOU SHOULD EXPECT: Some feelings of bloating in the abdomen. Passage of more gas than usual.  Walking can help get rid of the air that was put into your GI tract during the procedure and reduce the bloating. If you had a lower endoscopy (such as a colonoscopy or flexible sigmoidoscopy) you may notice spotting of blood in your stool or on the toilet paper. If you underwent a bowel prep for your procedure, you may not have a normal bowel movement for a few days.  Please Note:  You might notice some irritation and congestion in your nose or some drainage.  This is from the oxygen used during your procedure.  There is no need for concern and it should clear up in a day or so.  SYMPTOMS TO REPORT IMMEDIATELY:   Following lower endoscopy (colonoscopy or flexible sigmoidoscopy):  Excessive amounts of blood in the stool  Significant tenderness or worsening of abdominal pains  Swelling of the abdomen that is new, acute  Fever of 100F or higher   For urgent or emergent issues, a gastroenterologist can be reached at any hour by calling [phone].   DIET:  Your first meal following the procedure should be a small meal and then it is ok to progress to your normal diet. Heavy or fried foods are harder to digest and may make you feel nauseous or bloated.  Likewise, meals heavy in dairy and vegetables can increase bloating.  Drink plenty of fluids but you should avoid alcoholic beverages for 24 hours.  ACTIVITY:  You should plan to take it easy for the rest of today and you should NOT DRIVE or use heavy machinery until tomorrow (because of the sedation medicines used during the test).    FOLLOW UP: Our staff will call the number listed on your records the next business day following your procedure to check on you and address any questions or concerns that you may have regarding the information given to you following your procedure. If we do not reach you, we will leave a message.  However, if you are feeling well and you are not experiencing any problems, there is no need to return our call.  We will assume that you have returned to your regular daily activities without incident.  If any biopsies were taken you will be contacted by phone or by letter within the next 1-3 weeks.  Please call us at [phone] if you have not heard about the biopsies in 3 weeks.    SIGNATURES/CONFIDENTIALITY: You and/or your care partner have signed paperwork which will be entered into your electronic medical record.  These  signatures attest to the fact that that the information above on your After Visit Summary has been reviewed and is understood.  Full responsibility of the confidentiality of this discharge information lies with you and/or your care-partner.  Polyps, diverticulosis-handouts given  Repeat colonoscopy will be determined by pathology.

## 2015-06-28 NOTE — Op Note (Signed)
[place]  [address]. [place], [address]   COLONOSCOPY PROCEDURE REPORT  PATIENT: David Cannon, David Cannon  MR#: [id number] BIRTHDATE: 1950/05/28 , 57  yrs. old GENDER: male ENDOSCOPIST: Gatha Mayer, MD, [facility] PROCEDURE DATE:  06/28/2015 PROCEDURE:   Colonoscopy, surveillance and Colonoscopy with snare polypectomy First Screening Colonoscopy - Avg.  risk and is 50 yrs.  old or older - No.  Prior Negative Screening - Now for repeat screening. N/A  History of Adenoma - Now for follow-up colonoscopy & has been > or = to 3 yrs.  Yes hx of adenoma.  Has been 3 or more years since last colonoscopy.  Polyps removed today? Yes ASA CLASS:   Class II INDICATIONS:Surveillance due to prior colonic neoplasia and PH Colon Adenoma. MEDICATIONS: Propofol 220 mg IV and Monitored anesthesia care  DESCRIPTION OF PROCEDURE:   After the risks benefits and alternatives of the procedure were thoroughly explained, informed consent was obtained.  The digital rectal exam revealed no abnormalities of the rectum, revealed no prostatic nodules, and revealed the prostate was not enlarged.   The [id number] endoscope was introduced through the anus and advanced to the cecum, which was identified by both the appendix and ileocecal valve. No adverse events experienced.   The quality of the prep was excellent.  (MiraLax was used)  The instrument was then slowly withdrawn as the colon was fully examined. Estimated blood loss is zero unless otherwise noted in this procedure report.   COLON FINDINGS: Two polypoid shaped polyps ranging from 2 to 26mm in size were found in the transverse colon.  Polypectomies were performed with a cold snare.  The resection was complete, the polyp tissue was completely retrieved and sent to histology.   There was mild diverticulosis noted in the descending colon and transverse colon.  Retroflexed views revealed no abnormalities. The time to cecum = 2.5  Withdrawal time = 9.1   The scope was withdrawn and the procedure completed. COMPLICATIONS: There were no immediate complications.  ENDOSCOPIC IMPRESSION: 1.   Two polyps ranging from 2 to 55mm in size were found in the transverse colon; polypectomies were performed with a cold snare 2.   There was mild diverticulosis noted in the descending colon and transverse colon  RECOMMENDATIONS: Timing of repeat colonoscopy will be determined by pathology findings.  he has hx 5 cm pedunculated adenoma 2002 and diminutive adenoma 2011  eSigned:  Gatha Mayer, MD, [facility] 06/28/2015 12:00 PM   cc: The Patient

## 2015-06-28 NOTE — Progress Notes (Signed)
Report to PACU, RN, vss, BBS= Clear.  

## 2015-06-28 NOTE — Progress Notes (Signed)
Called to room to assist during endoscopic procedure.  Patient ID and intended procedure confirmed with present staff. Received instructions for my participation in the procedure from the performing physician.  

## 2015-06-28 NOTE — Telephone Encounter (Signed)
Patient states that he forgot to drink the water after drinking prep.  I advised him that he could go ahead and do it right now since it was not 9:30 am yet.  I advised him not to drink anything else after that  He will arrive at 10:30 for an 11:30 procedure.  All questions were answered.

## 2015-06-29 ENCOUNTER — Telehealth: Payer: Self-pay | Admitting: Emergency Medicine

## 2015-06-29 NOTE — Telephone Encounter (Signed)
  Follow up Call-  Call back number 06/28/2015  Post procedure Call Back phone  # [phone]  Permission to leave phone message Yes     Patient questions:  Do you have a fever, pain , or abdominal swelling? No. Pain Score  0 *  Have you tolerated food without any problems? Yes.    Have you been able to return to your normal activities? Yes.    Do you have any questions about your discharge instructions: Diet   No. Medications  No. Follow up visit  No.  Do you have questions or concerns about your Care? No.  Actions: * If pain score is 4 or above: No action needed, pain <4.

## 2015-07-02 ENCOUNTER — Encounter: Payer: Self-pay | Admitting: Internal Medicine

## 2015-07-02 DIAGNOSIS — Z86010 Personal history of colonic polyps: Secondary | ICD-10-CM

## 2015-07-02 NOTE — Progress Notes (Signed)
Quick Note:  2 adenomas Repeat colonoscopy 2022 ______

## 2015-12-29 ENCOUNTER — Other Ambulatory Visit: Payer: Self-pay

## 2016-01-12 ENCOUNTER — Ambulatory Visit (INDEPENDENT_AMBULATORY_CARE_PROVIDER_SITE_OTHER): Payer: Medicare Other | Admitting: Adult Health

## 2016-01-12 ENCOUNTER — Encounter: Payer: Self-pay | Admitting: Adult Health

## 2016-01-12 VITALS — BP 128/78 | Temp 98.6°F | Ht 72.0 in | Wt 221.0 lb

## 2016-01-12 DIAGNOSIS — I10 Essential (primary) hypertension: Secondary | ICD-10-CM

## 2016-01-12 DIAGNOSIS — Z7689 Persons encountering health services in other specified circumstances: Secondary | ICD-10-CM

## 2016-01-12 DIAGNOSIS — Z7189 Other specified counseling: Secondary | ICD-10-CM

## 2016-01-12 DIAGNOSIS — R7309 Other abnormal glucose: Secondary | ICD-10-CM | POA: Diagnosis not present

## 2016-01-12 DIAGNOSIS — Z23 Encounter for immunization: Secondary | ICD-10-CM

## 2016-01-12 LAB — POCT GLYCOSYLATED HEMOGLOBIN (HGB A1C): Hemoglobin A1C: 5.8

## 2016-01-12 NOTE — Patient Instructions (Addendum)
It was great seeing you today!  Please follow up with me for your physical.   Your A1c is 5.8   Please let me know if you need anything.   Health Maintenance, Male A healthy lifestyle and preventative care can promote health and wellness.  Maintain regular health, dental, and eye exams.  Eat a healthy diet. Foods like vegetables, fruits, whole grains, low-fat dairy products, and lean protein foods contain the nutrients you need and are low in calories. Decrease your intake of foods high in solid fats, added sugars, and salt. Get information about a proper diet from your health care provider, if necessary.  Regular physical exercise is one of the most important things you can do for your health. Most adults should get at least 150 minutes of moderate-intensity exercise (any activity that increases your heart rate and causes you to sweat) each week. In addition, most adults need muscle-strengthening exercises on 2 or more days a week.   Maintain a healthy weight. The body mass index (BMI) is a screening tool to identify possible weight problems. It provides an estimate of body fat based on height and weight. Your health care provider can find your BMI and can help you achieve or maintain a healthy weight. For males 20 years and older:  A BMI below 18.5 is considered underweight.  A BMI of 18.5 to 24.9 is normal.  A BMI of 25 to 29.9 is considered overweight.  A BMI of 30 and above is considered obese.  Maintain normal blood lipids and cholesterol by exercising and minimizing your intake of saturated fat. Eat a balanced diet with plenty of fruits and vegetables. Blood tests for lipids and cholesterol should begin at age 68 and be repeated every 5 years. If your lipid or cholesterol levels are high, you are over age 23, or you are at high risk for heart disease, you may need your cholesterol levels checked more frequently.Ongoing high lipid and cholesterol levels should be treated with  medicines if diet and exercise are not working.  If you smoke, find out from your health care provider how to quit. If you do not use tobacco, do not start.  Lung cancer screening is recommended for adults aged 74-80 years who are at high risk for developing lung cancer because of a history of smoking. A yearly low-dose CT scan of the lungs is recommended for people who have at least a 30-pack-year history of smoking and are current smokers or have quit within the past 15 years. A pack year of smoking is smoking an average of 1 pack of cigarettes a day for 1 year (for example, a 30-pack-year history of smoking could mean smoking 1 pack a day for 30 years or 2 packs a day for 15 years). Yearly screening should continue until the smoker has stopped smoking for at least 15 years. Yearly screening should be stopped for people who develop a health problem that would prevent them from having lung cancer treatment.  If you choose to drink alcohol, do not have more than 2 drinks per day. One drink is considered to be 12 oz (360 mL) of beer, 5 oz (150 mL) of wine, or 1.5 oz (45 mL) of liquor.  Avoid the use of street drugs. Do not share needles with anyone. Ask for help if you need support or instructions about stopping the use of drugs.  High blood pressure causes heart disease and increases the risk of stroke. High blood pressure is more likely  develop in:  People who have blood pressure in the end of the normal range (100-139/85-89 mm Hg).  People who are overweight or obese.  People who are African American.  If you are 18-39 years of age, have your blood pressure checked every 3-5 years. If you are 40 years of age or older, have your blood pressure checked every year. You should have your blood pressure measured twice--once when you are at a hospital or clinic, and once when you are not at a hospital or clinic. Record the average of the two measurements. To check your blood pressure when you are not  at a hospital or clinic, you can use:  An automated blood pressure machine at a pharmacy.  A home blood pressure monitor.  If you are 45-79 years old, ask your health care provider if you should take aspirin to prevent heart disease.  Diabetes screening involves taking a blood sample to check your fasting blood sugar level. This should be done once every 3 years after age 45 if you are at a normal weight and without risk factors for diabetes. Testing should be considered at a younger age or be carried out more frequently if you are overweight and have at least 1 risk factor for diabetes.  Colorectal cancer can be detected and often prevented. Most routine colorectal cancer screening begins at the age of 50 and continues through age 75. However, your health care provider may recommend screening at an earlier age if you have risk factors for colon cancer. On a yearly basis, your health care provider may provide home test kits to check for hidden blood in the stool. A small camera at the end of a tube may be used to directly examine the colon (sigmoidoscopy or colonoscopy) to detect the earliest forms of colorectal cancer. Talk to your health care provider about this at age 50 when routine screening begins. A direct exam of the colon should be repeated every 5-10 years through age 75, unless early forms of precancerous polyps or small growths are found.  People who are at an increased risk for hepatitis B should be screened for this virus. You are considered at high risk for hepatitis B if:  You were born in a country where hepatitis B occurs often. Talk with your health care provider about which countries are considered high risk.  Your parents were born in a high-risk country and you have not received a shot to protect against hepatitis B (hepatitis B vaccine).  You have HIV or AIDS.  You use needles to inject street drugs.  You live with, or have sex with, someone who has hepatitis B.  You  are a man who has sex with other men (MSM).  You get hemodialysis treatment.  You take certain medicines for conditions like cancer, organ transplantation, and autoimmune conditions.  Hepatitis C blood testing is recommended for all people born from 1945 through 1965 and any individual with known risk factors for hepatitis C.  Healthy men should no longer receive prostate-specific antigen (PSA) blood tests as part of routine cancer screening. Talk to your health care provider about prostate cancer screening.  Testicular cancer screening is not recommended for adolescents or adult males who have no symptoms. Screening includes self-exam, a health care provider exam, and other screening tests. Consult with your health care provider about any symptoms you have or any concerns you have about testicular cancer.  Practice safe sex. Use condoms and avoid high-risk sexual practices to reduce   the spread of sexually transmitted infections (STIs).  You should be screened for STIs, including gonorrhea and chlamydia if:  You are sexually active and are younger than 24 years.  You are older than 24 years, and your health care provider tells you that you are at risk for this type of infection.  Your sexual activity has changed since you were last screened, and you are at an increased risk for chlamydia or gonorrhea. Ask your health care provider if you are at risk.  If you are at risk of being infected with HIV, it is recommended that you take a prescription medicine daily to prevent HIV infection. This is called pre-exposure prophylaxis (PrEP). You are considered at risk if:  You are a man who has sex with other men (MSM).  You are a heterosexual man who is sexually active with multiple partners.  You take drugs by injection.  You are sexually active with a partner who has HIV.  Talk with your health care provider about whether you are at high risk of being infected with HIV. If you choose to begin  PrEP, you should first be tested for HIV. You should then be tested every 3 months for as long as you are taking PrEP.  Use sunscreen. Apply sunscreen liberally and repeatedly throughout the day. You should seek shade when your shadow is shorter than you. Protect yourself by wearing long sleeves, pants, a wide-brimmed hat, and sunglasses year round whenever you are outdoors.  Tell your health care provider of new moles or changes in moles, especially if there is a change in shape or color. Also, tell your health care provider if a mole is larger than the size of a pencil eraser.  A one-time screening for abdominal aortic aneurysm (AAA) and surgical repair of large AAAs by ultrasound is recommended for men aged 65-75 years who are current or former smokers.  Stay current with your vaccines (immunizations).   This information is not intended to replace advice given to you by your health care provider. Make sure you discuss any questions you have with your health care provider.   Document Released: 10/14/2007 Document Revised: 05/08/2014 Document Reviewed: 09/12/2010 Elsevier Interactive Patient Education 2016 Elsevier Inc.   

## 2016-01-12 NOTE — Progress Notes (Signed)
Patient presents to clinic today to establish care. He is a pleasant 35 year AA male who  has a past medical history of CRI (chronic renal insufficiency); Hypertension; Other abnormal glucose; and Personal history of colonic polyps (03/08/2001).  His last physical was greater than one year ago.   Acute Concerns: Establish Care  Chronic Issues: Abnormal glucose - he has been borderline diabetic in the past. His last A1c was 6.4 a year ago.   Health Maintenance: Dental -- Does Routine visit  Vision -- Routine care Immunizations -- UTD  Colonoscopy -- 06/2015 - every 5 years due to family history  Diet: He eats healthy  Exercise: He goes to the gym multiple times per week.   Is followed by: No one    Past Medical History:  Diagnosis Date  . CRI (chronic renal insufficiency)   . Hypertension    resolved  . Other abnormal glucose   . Personal history of colonic polyps 03/08/2001    Past Surgical History:  Procedure Laterality Date  . COLONOSCOPY W/ POLYPECTOMY      Current Outpatient Prescriptions on File Prior to Visit  Medication Sig Dispense Refill  . albuterol (PROVENTIL HFA;VENTOLIN HFA) 108 (90 BASE) MCG/ACT inhaler Inhale 2 puffs into the lungs every 6 (six) hours as needed for wheezing or shortness of breath. 1 Inhaler 0  . aspirin 81 MG EC tablet Take 81 mg by mouth daily.       No current facility-administered medications on file prior to visit.     No Known Allergies  Family History  Problem Relation Age of Onset  . Cancer Mother     lung  . Cancer Father     prostate  . Cancer Maternal Grandfather     colon  . Colon cancer Maternal Grandfather   . Coronary artery disease Neg Hx     Social History   Social History  . Marital status: Married    Spouse name: N/A  . Number of children: N/A  . Years of education: N/A   Occupational History  . Not on file.   Social History Main Topics  . Smoking status: Never Smoker  . Smokeless tobacco:  Never Used  . Alcohol use 0.0 oz/week     Comment: once every 6 months  . Drug use: No  . Sexual activity: Not on file   Other Topics Concern  . Not on file   Social History Narrative  . No narrative on file    Review of Systems  Constitutional: Negative.   HENT: Negative.   Eyes: Negative.   Respiratory: Negative.   Cardiovascular: Negative.   Gastrointestinal: Negative.   Genitourinary: Negative.   Musculoskeletal: Negative.   Skin: Negative.   Neurological: Negative.   Endo/Heme/Allergies: Negative.   All other systems reviewed and are negative.   BP 128/78   Temp 98.6 F (37 C) (Oral)   Ht 6' (1.829 m)   Wt 221 lb (100.2 kg)   BMI 29.97 kg/m   Physical Exam  Constitutional: He is oriented to person, place, and time and well-developed, well-nourished, and in no distress. No distress.  Cardiovascular: Normal rate, regular rhythm, normal heart sounds and intact distal pulses.  Exam reveals no gallop and no friction rub.   No murmur heard. Pulmonary/Chest: Effort normal and breath sounds normal. No respiratory distress. He has no wheezes. He has no rales. He exhibits no tenderness.  Neurological: He is alert and oriented to person, place, and time.  Gait normal. GCS score is 15.  Skin: Skin is warm and dry. No rash noted. He is not diaphoretic. No erythema. No pallor.  Psychiatric: Mood, memory, affect and judgment normal.  Nursing note and vitals reviewed.  Assessment/Plan: 1. Encounter to establish care - Follow up for annual exam  - Follow up for MWE - Encouraged to eat healthy and exercise  2. Essential hypertension - Controlled with diet   3. Abnormal glucose - POC HgB A1c - 5.8   4. Need for prophylactic vaccination and inoculation against influenza  - Flu Vaccine QUAD 36+ mos PF IM (Fluarix & Fluzone Quad PF)  5. Need for vaccination with 13-polyvalent pneumococcal conjugate vaccine  - Pneumococcal conjugate vaccine 13-valent IM  [name],  NP

## 2016-02-23 ENCOUNTER — Other Ambulatory Visit (INDEPENDENT_AMBULATORY_CARE_PROVIDER_SITE_OTHER): Payer: Medicare Other

## 2016-02-23 DIAGNOSIS — Z0000 Encounter for general adult medical examination without abnormal findings: Secondary | ICD-10-CM | POA: Diagnosis not present

## 2016-02-23 DIAGNOSIS — Z125 Encounter for screening for malignant neoplasm of prostate: Secondary | ICD-10-CM | POA: Diagnosis not present

## 2016-02-23 LAB — HEPATIC FUNCTION PANEL
ALT: 23 U/L (ref 0–53)
AST: 18 U/L (ref 0–37)
Albumin: 4.3 g/dL (ref 3.5–5.2)
Alkaline Phosphatase: 52 U/L (ref 39–117)
BILIRUBIN DIRECT: 0.1 mg/dL (ref 0.0–0.3)
BILIRUBIN TOTAL: 0.7 mg/dL (ref 0.2–1.2)
Total Protein: 7.4 g/dL (ref 6.0–8.3)

## 2016-02-23 LAB — POC URINALSYSI DIPSTICK (AUTOMATED)
Bilirubin, UA: NEGATIVE
Blood, UA: NEGATIVE
Glucose, UA: NEGATIVE
KETONES UA: NEGATIVE
Leukocytes, UA: NEGATIVE
Nitrite, UA: NEGATIVE
PROTEIN UA: NEGATIVE
SPEC GRAV UA: 1.015
Urobilinogen, UA: 0.2
pH, UA: 6

## 2016-02-23 LAB — TSH: TSH: 1.29 u[IU]/mL (ref 0.35–4.50)

## 2016-02-23 LAB — BASIC METABOLIC PANEL
BUN: 17 mg/dL (ref 6–23)
CHLORIDE: 106 meq/L (ref 96–112)
CO2: 31 meq/L (ref 19–32)
CREATININE: 1.36 mg/dL (ref 0.40–1.50)
Calcium: 9.7 mg/dL (ref 8.4–10.5)
GFR: 67.15 mL/min (ref >60.00)
Glucose, Bld: 95 mg/dL (ref 70–99)
Potassium: 4.5 mEq/L (ref 3.5–5.1)
Sodium: 143 mEq/L (ref 135–145)

## 2016-02-23 LAB — CBC WITH DIFFERENTIAL/PLATELET
BASOS PCT: 0.5 % (ref 0.0–3.0)
Basophils Absolute: 0 10*3/uL (ref 0.0–0.1)
EOS ABS: 0.3 10*3/uL (ref 0.0–0.7)
Eosinophils Relative: 4.3 % (ref 0.0–5.0)
HCT: 43.4 % (ref 39.0–52.0)
Hemoglobin: 13.9 g/dL (ref 13.0–17.0)
Lymphocytes Relative: 55.5 % — ABNORMAL HIGH (ref 12.0–46.0)
Lymphs Abs: 4 10*3/uL (ref 0.7–4.0)
MCHC: 32 g/dL (ref 30.0–36.0)
MCV: 74.6 fl — ABNORMAL LOW (ref 78.0–100.0)
MONO ABS: 0.6 10*3/uL (ref 0.1–1.0)
Monocytes Relative: 7.7 % (ref 3.0–12.0)
NEUTROS ABS: 2.3 10*3/uL (ref 1.4–7.7)
NEUTROS PCT: 32 % — AB (ref 43.0–77.0)
PLATELETS: 231 10*3/uL (ref 150.0–400.0)
RBC: 5.82 Mil/uL — ABNORMAL HIGH (ref 4.22–5.81)
RDW: 16.8 % — AB (ref 11.5–15.5)
WBC: 7.3 10*3/uL (ref 4.0–10.5)

## 2016-02-23 LAB — LIPID PANEL
CHOL/HDL RATIO: 4
Cholesterol: 154 mg/dL (ref 0–200)
HDL: 34.8 mg/dL — AB (ref >39.00)
LDL CALC: 89 mg/dL (ref 0–99)
NonHDL: 119.15
TRIGLYCERIDES: 151 mg/dL — AB (ref 0.0–149.0)
VLDL: 30.2 mg/dL (ref 0.0–40.0)

## 2016-02-23 LAB — PSA: PSA: 0.48 ng/mL (ref 0.10–4.00)

## 2016-03-01 ENCOUNTER — Ambulatory Visit (INDEPENDENT_AMBULATORY_CARE_PROVIDER_SITE_OTHER): Payer: Medicare Other | Admitting: Adult Health

## 2016-03-01 ENCOUNTER — Encounter: Payer: Self-pay | Admitting: Adult Health

## 2016-03-01 VITALS — BP 140/80 | Temp 98.1°F | Ht 72.0 in | Wt 221.0 lb

## 2016-03-01 DIAGNOSIS — I10 Essential (primary) hypertension: Secondary | ICD-10-CM | POA: Diagnosis not present

## 2016-03-01 DIAGNOSIS — Z0000 Encounter for general adult medical examination without abnormal findings: Secondary | ICD-10-CM | POA: Diagnosis not present

## 2016-03-01 DIAGNOSIS — E785 Hyperlipidemia, unspecified: Secondary | ICD-10-CM

## 2016-03-01 NOTE — Progress Notes (Addendum)
Subjective:    Patient ID: David Cannon, male    DOB: 1950/11/23, 65 y.o.   MRN: [id number]  HPI  Patient presents for yearly follow up due to a history of hyperlipidemia and hypertension. He is a pleasant and very active 65 year old male who  has a past medical history of CRI (chronic renal insufficiency); ED (erectile dysfunction); Hyperlipidemia; Hypertension; Other abnormal glucose; and Personal history of colonic polyps (03/08/2001).   Medicare Wellness Exam paperwork reviewed. He has not had any interval history over the last year   All immunizations and health maintenance protocols were reviewed with the patient and needed orders were placed.  Medication reconciliation,  past medical history, social history, problem list and allergies were reviewed in detail with the patient  Goals were established with regard to weight loss, exercise, and  diet in compliance with medications. He goes to the gym multiple times per week and eats healthy.   End of life planning was discussed.He declined information at this time  He is up to date on his colonoscopy, dental and vision exams.   He sees no other medical providers besides his dentist and eye doctor.   Cognitive function is WNL. He denies having any guns in the home. He also denies any issues with his activities of daily living.    Review of Systems  Constitutional: Negative.   HENT: Negative.   Eyes: Negative.   Respiratory: Negative.   Cardiovascular: Negative.   Gastrointestinal: Negative.   Endocrine: Negative.   Genitourinary: Negative.   Musculoskeletal: Negative.   Allergic/Immunologic: Negative.   Neurological: Negative.   Hematological: Negative.    Past Medical History:  Diagnosis Date  . CRI (chronic renal insufficiency)   . ED (erectile dysfunction)   . Hyperlipidemia   . Hypertension    resolved  . Other abnormal glucose   . Personal history of colonic polyps 03/08/2001    Social History   Social  History  . Marital status: Married    Spouse name: N/A  . Number of children: N/A  . Years of education: N/A   Occupational History  . Not on file.   Social History Main Topics  . Smoking status: Never Smoker  . Smokeless tobacco: Never Used  . Alcohol use 0.0 oz/week     Comment: once every 6 months  . Drug use: No  . Sexual activity: Not on file   Other Topics Concern  . Not on file   Social History Narrative   Retired from working at the [employer]    Married    Daughter and step son   Three grand children       He likes to go to [employer] and spending time with his wife.           Past Surgical History:  Procedure Laterality Date  . COLONOSCOPY W/ POLYPECTOMY      Family History  Problem Relation Age of Onset  . Colon cancer Father     prostate  . Lung cancer Mother     lung  . Cancer Maternal Grandfather     colon  . Colon cancer Maternal Grandfather   . Coronary artery disease Neg Hx     No Known Allergies  Current Outpatient Prescriptions on File Prior to Visit  Medication Sig Dispense Refill  . albuterol (PROVENTIL HFA;VENTOLIN HFA) 108 (90 BASE) MCG/ACT inhaler Inhale 2 puffs into the lungs every 6 (six) hours as needed for wheezing or shortness of  breath. 1 Inhaler 0  . aspirin 81 MG EC tablet Take 81 mg by mouth daily.       No current facility-administered medications on file prior to visit.     There were no vitals taken for this visit.      Objective:   Physical Exam  Constitutional: He appears well-developed and well-nourished. No distress.  HENT:  Head: Normocephalic and atraumatic.  Right Ear: External ear normal.  Left Ear: External ear normal.  Nose: Nose normal.  Mouth/Throat: Oropharynx is clear and moist. No oropharyngeal exudate.  Eyes: Conjunctivae are normal. Pupils are equal, round, and reactive to light. Right eye exhibits no discharge. Left eye exhibits no discharge. No scleral icterus.  Neck: Normal range of motion.  Neck supple. No JVD present. Carotid bruit is not present. No tracheal deviation present. No thyroid mass and no thyromegaly present.  Cardiovascular: Normal rate, regular rhythm, normal heart sounds and intact distal pulses.  Exam reveals no gallop and no friction rub.   No murmur heard. Pulmonary/Chest: Effort normal and breath sounds normal. No stridor. No respiratory distress. He has no wheezes. He has no rales. He exhibits no tenderness.  Abdominal: Soft. Normal appearance, normal aorta and bowel sounds are normal. He exhibits mass. He exhibits no distension. There is no tenderness. There is no rebound and no guarding.  Genitourinary: Rectum normal and prostate normal. Rectal exam shows guaiac negative stool.  Musculoskeletal: Normal range of motion. He exhibits no edema, tenderness or deformity.  Lymphadenopathy:    He has no cervical adenopathy.  Neurological: He is alert. He displays normal reflexes. No cranial nerve deficit. He exhibits normal muscle tone. Coordination normal.  Skin: Skin is warm and dry. No rash noted. He is not diaphoretic. No erythema. No pallor.  Psychiatric: He has a normal mood and affect. His behavior is normal. Judgment and thought content normal.  Nursing note and vitals reviewed.    Assessment & Plan:  1. Encounter for Medicare annual wellness exam - Continue to diet and exercise - Follow up in one year for next AWE - Reviewed labs in detail with patient  2. Hyperlipidemia, unspecified hyperlipidemia type - Reviewed lipid panel with patient. His lipid panel has increased since last visit. Does not need to go on statin at this time - EKG 12-Lead- Sinus Brady, rate 54 - Cut back on eating out  - Continue to exercise - Will continue to monitor.  - Follow up in one year  3. Essential hypertension - EKG 12-Lead - Slightly elevated today at 140/80.  - He has been well controlled w/o medication  - He will check his blood pressures this week and then send  me a my chart message - Consider adding agent back into regimen - Continue to exercise and eat healthy        [name], NP

## 2016-03-07 ENCOUNTER — Encounter: Payer: Self-pay | Admitting: Adult Health

## 2016-03-08 ENCOUNTER — Encounter: Payer: Self-pay | Admitting: Adult Health

## 2016-03-08 ENCOUNTER — Other Ambulatory Visit: Payer: Self-pay | Admitting: Adult Health

## 2016-03-08 MED ORDER — OLMESARTAN MEDOXOMIL-HCTZ 20-12.5 MG PO TABS: 1.0000 | ORAL_TABLET | Freq: Every day | ORAL | 1 refills | Status: DC

## 2016-03-10 ENCOUNTER — Other Ambulatory Visit: Payer: Self-pay

## 2016-03-10 ENCOUNTER — Telehealth: Payer: Self-pay | Admitting: Adult Health

## 2016-03-10 DIAGNOSIS — Z1159 Encounter for screening for other viral diseases: Secondary | ICD-10-CM

## 2016-03-10 NOTE — Telephone Encounter (Signed)
Order has been placed.

## 2016-03-10 NOTE — Telephone Encounter (Signed)
lmom for pt to call office to schedule appointment

## 2016-03-10 NOTE — Telephone Encounter (Signed)
Pt would like to have a shingle and hep C screening can I get a order so that I can schedule him?

## 2016-03-14 NOTE — Telephone Encounter (Signed)
Pt scheduled  

## 2016-03-16 ENCOUNTER — Other Ambulatory Visit (INDEPENDENT_AMBULATORY_CARE_PROVIDER_SITE_OTHER): Payer: Medicare Other

## 2016-03-16 ENCOUNTER — Ambulatory Visit (INDEPENDENT_AMBULATORY_CARE_PROVIDER_SITE_OTHER): Payer: Medicare Other

## 2016-03-16 DIAGNOSIS — Z2911 Encounter for prophylactic immunotherapy for respiratory syncytial virus (RSV): Secondary | ICD-10-CM | POA: Diagnosis not present

## 2016-03-16 DIAGNOSIS — Z23 Encounter for immunization: Secondary | ICD-10-CM | POA: Diagnosis not present

## 2016-03-16 DIAGNOSIS — Z1159 Encounter for screening for other viral diseases: Secondary | ICD-10-CM

## 2016-03-17 LAB — HEPATITIS C ANTIBODY: HCV AB: NEGATIVE

## 2016-03-28 DIAGNOSIS — H43813 Vitreous degeneration, bilateral: Secondary | ICD-10-CM | POA: Diagnosis not present

## 2016-05-02 DIAGNOSIS — E119 Type 2 diabetes mellitus without complications: Secondary | ICD-10-CM | POA: Diagnosis not present

## 2016-06-13 DIAGNOSIS — H21233 Degeneration of iris (pigmentary), bilateral: Secondary | ICD-10-CM | POA: Diagnosis not present

## 2016-07-20 ENCOUNTER — Encounter: Payer: Self-pay | Admitting: Adult Health

## 2016-07-20 ENCOUNTER — Ambulatory Visit (INDEPENDENT_AMBULATORY_CARE_PROVIDER_SITE_OTHER): Payer: Medicare Other | Admitting: Adult Health

## 2016-07-20 VITALS — BP 134/80 | Temp 98.8°F | Wt 224.8 lb

## 2016-07-20 DIAGNOSIS — J40 Bronchitis, not specified as acute or chronic: Secondary | ICD-10-CM | POA: Diagnosis not present

## 2016-07-20 MED ORDER — HYDROCODONE-HOMATROPINE 5-1.5 MG/5ML PO SYRP: 5.0000 mL | ORAL_SOLUTION | Freq: Three times a day (TID) | ORAL | 0 refills | Status: DC | PRN

## 2016-07-20 NOTE — Progress Notes (Signed)
Subjective:    Patient ID: David Cannon, male    DOB: 03/23/1951, 66 y.o.   MRN: [id number]  HPI  66 year old male who  has a past medical history of CRI (chronic renal insufficiency); ED (erectile dysfunction); Hyperlipidemia; Hypertension; Other abnormal glucose; and Personal history of colonic polyps (03/08/2001). He presents to the office today for an acute issue of semi productive cough x 4 days.   He denies any sinus pain/pressure, fevers, or feeling ill. He does endorse wheezing at night.   He reports " I get bronchitis every year about this time. The cough syrup works well for me."   He has not been using OTC   Review of Systems   Negative unless mentioned above   Past Medical History:  Diagnosis Date  . CRI (chronic renal insufficiency)   . ED (erectile dysfunction)   . Hyperlipidemia   . Hypertension    resolved  . Other abnormal glucose   . Personal history of colonic polyps 03/08/2001    Social History   Social History  . Marital status: Married    Spouse name: N/A  . Number of children: N/A  . Years of education: N/A   Occupational History  . Not on file.   Social History Main Topics  . Smoking status: Never Smoker  . Smokeless tobacco: Never Used  . Alcohol use 0.0 oz/week     Comment: once every 6 months  . Drug use: No  . Sexual activity: Not on file   Other Topics Concern  . Not on file   Social History Narrative   Retired from working at the post [employer]    Married    Daughter and step son   Three grand children       He likes to go to [employer] and spending time with his wife.           Past Surgical History:  Procedure Laterality Date  . COLONOSCOPY W/ POLYPECTOMY      Family History  Problem Relation Age of Onset  . Colon cancer Father     prostate  . Lung cancer Mother     lung  . Cancer Maternal Grandfather     colon  . Colon cancer Maternal Grandfather   . Coronary artery disease Neg Hx     No Known  Allergies  Current Outpatient Prescriptions on File Prior to Visit  Medication Sig Dispense Refill  . albuterol (PROVENTIL HFA;VENTOLIN HFA) 108 (90 BASE) MCG/ACT inhaler Inhale 2 puffs into the lungs every 6 (six) hours as needed for wheezing or shortness of breath. (Patient not taking: Reported on 03/01/2016) 1 Inhaler 0  . aspirin 81 MG EC tablet Take 81 mg by mouth daily.      Marland Kitchen olmesartan-hydrochlorothiazide (BENICAR HCT) 20-12.5 MG tablet Take 1 tablet by mouth daily. 90 tablet 1   No current facility-administered medications on file prior to visit.     BP 134/80 (BP Location: Left Arm, Patient Position: Sitting, Cuff Size: Normal)   Temp 98.8 F (37.1 C) (Oral)   Wt 224 lb 12.8 oz (102 kg)   BMI 30.49 kg/m       Objective:   Physical Exam  Constitutional: He is oriented to person, place, and time. He appears well-developed and well-nourished. No distress.  HENT:  Head: Normocephalic and atraumatic.  Right Ear: External ear normal.  Left Ear: External ear normal.  Nose: Nose normal.  Mouth/Throat: Oropharynx is clear and moist. No  oropharyngeal exudate.  Eyes: Conjunctivae and EOM are normal. Pupils are equal, round, and reactive to light. Right eye exhibits no discharge. Left eye exhibits no discharge. No scleral icterus.  Cardiovascular: Normal rate, regular rhythm and intact distal pulses.  Exam reveals friction rub. Exam reveals no gallop.   No murmur heard. Pulmonary/Chest: Effort normal. No respiratory distress. He has wheezes (trace wheezing in upper lobes that cleared after coughing ). He has no rales. He exhibits no tenderness.  Neurological: He is alert and oriented to person, place, and time.  Skin: Skin is warm and dry. No rash noted. He is not diaphoretic. No erythema. No pallor.  Psychiatric: He has a normal mood and affect. His behavior is normal. Judgment and thought content normal.  Nursing note and vitals reviewed.     Assessment & Plan:  1.  Bronchitis - HYDROcodone-homatropine (HYCODAN) 5-1.5 MG/5ML syrup; Take 5 mLs by mouth every 8 (eight) hours as needed for cough.  Dispense: 120 mL; Refill: 0 - Add mucinex  - Follow up if no improveement - Ok to use inhaler   [name], NP

## 2017-01-03 DIAGNOSIS — H40013 Open angle with borderline findings, low risk, bilateral: Secondary | ICD-10-CM | POA: Diagnosis not present

## 2017-01-19 ENCOUNTER — Encounter: Payer: Self-pay | Admitting: Adult Health

## 2017-02-01 DIAGNOSIS — Z23 Encounter for immunization: Secondary | ICD-10-CM | POA: Diagnosis not present

## 2017-03-12 ENCOUNTER — Other Ambulatory Visit: Payer: Self-pay | Admitting: Adult Health

## 2017-03-13 NOTE — Telephone Encounter (Signed)
Sent to the pharmacy by e-scribe.  Pt is scheduled for yearly on 04/05/17.

## 2017-04-05 ENCOUNTER — Ambulatory Visit (INDEPENDENT_AMBULATORY_CARE_PROVIDER_SITE_OTHER): Payer: Medicare Other | Admitting: Adult Health

## 2017-04-05 ENCOUNTER — Encounter: Payer: Self-pay | Admitting: Adult Health

## 2017-04-05 VITALS — BP 118/78 | HR 97 | Temp 97.8°F | Ht 72.0 in | Wt 233.0 lb

## 2017-04-05 DIAGNOSIS — E785 Hyperlipidemia, unspecified: Secondary | ICD-10-CM | POA: Diagnosis not present

## 2017-04-05 DIAGNOSIS — Z125 Encounter for screening for malignant neoplasm of prostate: Secondary | ICD-10-CM

## 2017-04-05 DIAGNOSIS — I10 Essential (primary) hypertension: Secondary | ICD-10-CM | POA: Diagnosis not present

## 2017-04-05 LAB — CBC WITH DIFFERENTIAL/PLATELET
Basophils Absolute: 0 10*3/uL (ref 0.0–0.1)
Basophils Relative: 0.5 % (ref 0.0–3.0)
EOS PCT: 3.2 % (ref 0.0–5.0)
Eosinophils Absolute: 0.2 10*3/uL (ref 0.0–0.7)
HEMATOCRIT: 41.2 % (ref 39.0–52.0)
Hemoglobin: 13 g/dL (ref 13.0–17.0)
LYMPHS ABS: 3.3 10*3/uL (ref 0.7–4.0)
MCHC: 31.6 g/dL (ref 30.0–36.0)
MCV: 75.7 fl — AB (ref 78.0–100.0)
MONOS PCT: 9 % (ref 3.0–12.0)
Monocytes Absolute: 0.5 10*3/uL (ref 0.1–1.0)
NEUTROS ABS: 1.6 10*3/uL (ref 1.4–7.7)
NEUTROS PCT: 28.7 % — AB (ref 43.0–77.0)
Platelets: 230 10*3/uL (ref 150.0–400.0)
RBC: 5.44 Mil/uL (ref 4.22–5.81)
RDW: 15.6 % — ABNORMAL HIGH (ref 11.5–15.5)
WBC: 5.6 10*3/uL (ref 4.0–10.5)

## 2017-04-05 LAB — LIPID PANEL
CHOLESTEROL: 148 mg/dL (ref 0–200)
HDL: 31.3 mg/dL — ABNORMAL LOW (ref >39.00)
LDL Cholesterol: 90 mg/dL (ref 0–99)
NONHDL: 116.95
Total CHOL/HDL Ratio: 5
Triglycerides: 134 mg/dL (ref 0.0–149.0)
VLDL: 26.8 mg/dL (ref 0.0–40.0)

## 2017-04-05 LAB — BASIC METABOLIC PANEL
BUN: 17 mg/dL (ref 6–23)
CHLORIDE: 105 meq/L (ref 96–112)
CO2: 28 meq/L (ref 19–32)
Calcium: 9.1 mg/dL (ref 8.4–10.5)
Creatinine, Ser: 1.44 mg/dL (ref 0.40–1.50)
GFR: 62.66 mL/min (ref >60.00)
Glucose, Bld: 99 mg/dL (ref 70–99)
POTASSIUM: 4.1 meq/L (ref 3.5–5.1)
SODIUM: 140 meq/L (ref 135–145)

## 2017-04-05 LAB — HEPATIC FUNCTION PANEL
ALBUMIN: 4.3 g/dL (ref 3.5–5.2)
ALK PHOS: 51 U/L (ref 39–117)
ALT: 25 U/L (ref 0–53)
AST: 22 U/L (ref 0–37)
Bilirubin, Direct: 0.1 mg/dL (ref 0.0–0.3)
TOTAL PROTEIN: 7 g/dL (ref 6.0–8.3)
Total Bilirubin: 0.6 mg/dL (ref 0.2–1.2)

## 2017-04-05 LAB — PSA: PSA: 0.48 ng/mL (ref 0.10–4.00)

## 2017-04-05 LAB — TSH: TSH: 1.25 u[IU]/mL (ref 0.35–4.50)

## 2017-04-05 MED ORDER — SILDENAFIL CITRATE 20 MG PO TABS: ORAL_TABLET | ORAL | 11 refills | Status: DC

## 2017-04-05 NOTE — Progress Notes (Signed)
Subjective:    Patient ID: David Cannon, male    DOB: 11/26/1950, 66 y.o.   MRN: [id number]  HPI  Patient presents for yearly preventative medicine examination. He is a pleasant 66 year old male who  has a past medical history of CRI (chronic renal insufficiency), ED (erectile dysfunction), Hyperlipidemia, Hypertension, Other abnormal glucose, and Personal history of colonic polyps (03/08/2001).  He takes Benicar HCT for hypertension   Hyperlipidemia has been diet controlled in the past.   All immunizations and health maintenance protocols were reviewed with the patient and needed orders were placed.  Appropriate screening laboratory values were ordered for the patient including screening of hyperlipidemia, renal function and hepatic function. If indicated by BPH, a PSA was ordered.  Medication reconciliation,  past medical history, social history, problem list and allergies were reviewed in detail with the patient  Goals were established with regard to weight loss, exercise, and  diet in compliance with medications. He continues to go to the gym multiple times per week and eats a heart healthy diet. Weight is up 12 pounds over the last year.   Wt Readings from Last 3 Encounters:  04/05/17 233 lb (105.7 kg)  07/20/16 224 lb 12.8 oz (102 kg)  03/01/16 221 lb (100.2 kg)    End of life planning was discussed. Refuses information at this time   He is up to date on his colonoscopy, dental and vision exams.   Cognitive function is WNL. He denies having any guns in the home. He also denies any issues with his activities of daily living.   Review of Systems  Constitutional: Negative.   HENT: Negative.   Eyes: Negative.   Respiratory: Negative.   Cardiovascular: Negative.   Gastrointestinal: Negative.   Endocrine: Negative.   Genitourinary: Negative.   Musculoskeletal: Positive for arthralgias (bilateral knees).  Skin: Negative.   Allergic/Immunologic: Negative.     Neurological: Negative.   Hematological: Negative.   Psychiatric/Behavioral: Negative.   All other systems reviewed and are negative.  Past Medical History:  Diagnosis Date  . CRI (chronic renal insufficiency)   . ED (erectile dysfunction)   . Hyperlipidemia   . Hypertension    resolved  . Other abnormal glucose   . Personal history of colonic polyps 03/08/2001    Social History   Socioeconomic History  . Marital status: Married    Spouse name: Not on file  . Number of children: Not on file  . Years of education: Not on file  . Highest education level: Not on file  Social Needs  . Financial resource strain: Not on file  . Food insecurity - worry: Not on file  . Food insecurity - inability: Not on file  . Transportation needs - medical: Not on file  . Transportation needs - non-medical: Not on file  Occupational History  . Not on file  Tobacco Use  . Smoking status: Never Smoker  . Smokeless tobacco: Never Used  Substance and Sexual Activity  . Alcohol use: Yes    Alcohol/week: 0.0 oz    Comment: once every 6 months  . Drug use: No  . Sexual activity: Not on file  Other Topics Concern  . Not on file  Social History Narrative   Retired from working at the post office    Married    Daughter and step son   Three grand children       He likes to go to [employer] and spending time with his wife.  Past Surgical History:  Procedure Laterality Date  . COLONOSCOPY W/ POLYPECTOMY      Family History  Problem Relation Age of Onset  . Colon cancer Father        prostate  . Lung cancer Mother        lung  . Cancer Maternal Grandfather        colon  . Colon cancer Maternal Grandfather   . Coronary artery disease Neg Hx     No Known Allergies  Current Outpatient Medications on File Prior to Visit  Medication Sig Dispense Refill  . albuterol (PROVENTIL HFA;VENTOLIN HFA) 108 (90 BASE) MCG/ACT inhaler Inhale 2 puffs into the lungs every 6 (six) hours as  needed for wheezing or shortness of breath. 1 Inhaler 0  . aspirin 81 MG EC tablet Take 81 mg by mouth daily.      Marland Kitchen HYDROcodone-homatropine (HYCODAN) 5-1.5 MG/5ML syrup Take 5 mLs by mouth every 8 (eight) hours as needed for cough. 120 mL 0  . olmesartan-hydrochlorothiazide (BENICAR HCT) 20-12.5 MG tablet TAKE ONE TABLET BY MOUTH ONCE DAILY 90 tablet 0   No current facility-administered medications on file prior to visit.     BP 118/78 (BP Location: Left Arm, Patient Position: Sitting, Cuff Size: Large)   Pulse 97   Temp 97.8 F (36.6 C) (Oral)   Ht 6' (1.829 m)   Wt 233 lb (105.7 kg)   SpO2 97%   BMI 31.60 kg/m       Objective:   Physical Exam  Constitutional: He is oriented to person, place, and time. He appears well-developed and well-nourished. No distress.  HENT:  Head: Normocephalic and atraumatic.  Right Ear: External ear normal.  Left Ear: External ear normal.  Nose: Nose normal.  Mouth/Throat: Oropharynx is clear and moist. No oropharyngeal exudate.  Eyes: Conjunctivae and EOM are normal. Pupils are equal, round, and reactive to light. Right eye exhibits no discharge. Left eye exhibits no discharge. No scleral icterus.  Neck: Normal range of motion. Neck supple. No JVD present. Carotid bruit is not present. No tracheal deviation present. No thyroid mass and no thyromegaly present.  Cardiovascular: Normal rate, regular rhythm, normal heart sounds and intact distal pulses. Exam reveals no gallop and no friction rub.  No murmur heard. Pulmonary/Chest: Effort normal and breath sounds normal. No stridor. No respiratory distress. He has no wheezes. He has no rales. He exhibits no tenderness.  Abdominal: Soft. Bowel sounds are normal. He exhibits no distension and no mass. There is no tenderness. There is no rebound and no guarding.  Genitourinary: Rectum normal and prostate normal. Rectal exam shows guaiac negative stool.  Musculoskeletal: Normal range of motion. He exhibits  no edema, tenderness or deformity.  Lymphadenopathy:    He has no cervical adenopathy.  Neurological: He is alert and oriented to person, place, and time. He displays normal reflexes. No cranial nerve deficit. He exhibits normal muscle tone. Coordination normal.  Skin: Skin is warm and dry. No rash noted. He is not diaphoretic. No erythema. No pallor.  Psychiatric: He has a normal mood and affect. His behavior is normal. Judgment and thought content normal.  Nursing note and vitals reviewed.     Assessment & Plan:  1. Essential hypertension - Well controlled. No change in medication at this time  - Encouraged weight loss through diet and exercise  - Basic metabolic panel - CBC with Differential/Platelet - Hepatic function panel - Lipid panel - TSH  2. Hyperlipidemia, unspecified hyperlipidemia  type - Consider statin  - Basic metabolic panel - CBC with Differential/Platelet - Hepatic function panel - Lipid panel - TSH  3. Prostate cancer screening  - PSA  [name], NP

## 2017-05-12 ENCOUNTER — Ambulatory Visit (INDEPENDENT_AMBULATORY_CARE_PROVIDER_SITE_OTHER): Payer: Medicare Other | Admitting: Family Medicine

## 2017-05-12 ENCOUNTER — Encounter: Payer: Self-pay | Admitting: Family Medicine

## 2017-05-12 VITALS — BP 126/78 | HR 80 | Temp 99.4°F | Ht 72.0 in | Wt 231.0 lb

## 2017-05-12 DIAGNOSIS — R05 Cough: Secondary | ICD-10-CM

## 2017-05-12 DIAGNOSIS — R059 Cough, unspecified: Secondary | ICD-10-CM

## 2017-05-12 MED ORDER — IPRATROPIUM BROMIDE 0.06 % NA SOLN: 2.0000 | Freq: Four times a day (QID) | NASAL | 0 refills | Status: DC

## 2017-05-12 MED ORDER — GUAIFENESIN-CODEINE 100-10 MG/5ML PO SOLN: 5.0000 mL | Freq: Three times a day (TID) | ORAL | 0 refills | Status: DC | PRN

## 2017-05-12 MED ORDER — AZITHROMYCIN 250 MG PO TABS: ORAL_TABLET | ORAL | 0 refills | Status: DC

## 2017-05-12 MED ORDER — METHYLPREDNISOLONE ACETATE 80 MG/ML IJ SUSP
80.0000 mg | Freq: Once | INTRAMUSCULAR | Status: AC
  Administered 2017-05-12: 80 mg via INTRAMUSCULAR

## 2017-05-12 NOTE — Patient Instructions (Signed)
Start the atrovent.  Start the cough syrup for your cough.  Start the zpack if your symptoms worsen or do not improve in a few days.  Please stay well hydrated.  You can take tylenol and/or motrin as needed for low grade fever and pain.  Please let me know if your symptoms worsen or fail to improve.  Take care, Dr Jerline Pain

## 2017-05-12 NOTE — Progress Notes (Signed)
    Subjective:  David Cannon is a 67 y.o. male who presents today for same-day appointment with a chief complaint of cough.   HPI:  Cough, Acute Issue Started about 3 days ago. Worsened over that time. Associated with wheeze, rhinorrhea, and sore throat. No fevers or chills. Tried zyrtec which has not significantly seemed to help.  No sick contacts.  No other obvious alleviating or aggravating factors.  ROS: Per HPI  PMH: He reports that  has never smoked. he has never used smokeless tobacco. He reports that he drinks alcohol. He reports that he does not use drugs.  Objective:  Physical Exam: BP 126/78 (BP Location: Left Arm, Patient Position: Sitting, Cuff Size: Large)   Pulse 80   Temp 99.4 F (37.4 C) (Oral)   Ht 6' (1.829 m)   Wt 231 lb (104.8 kg)   SpO2 97%   BMI 31.33 kg/m   Gen: NAD, resting comfortably HEENT: TMs with clear effusion bilaterally.  Oropharynx clear.  Maxillary sinuses clear.  Moist mucous membranes. CV: RRR with no murmurs appreciated Pulm: NWOB, CTAB with no crackles, wheezes, or rhonchi  Assessment/Plan:  Cough Likely secondary to viral URI. No signs of bacterial infection. Start atrovent for rhinorrhea/sinus congestion. Will give 80mg  IM depo-medrol for sore throat. Start guaifenesin-codeine cough syrup for cough. Sent in a "pocket prescription" for azithromycin with strict instruction to not start unless symptoms worse or fail to improve within the next several days. Encouraged good oral hydration. Return precautions reviewed. Follow up as needed.    Algis Greenhouse. Jerline Pain, MD 05/12/2017 11:22 AM

## 2017-05-14 ENCOUNTER — Telehealth: Payer: Self-pay | Admitting: Family Medicine

## 2017-05-14 NOTE — Telephone Encounter (Signed)
Pt was seen on 05/12/2017 Saturday and given cough syrup. Please call pt once phone lines are back on.

## 2017-05-14 NOTE — Telephone Encounter (Signed)
Copied from [place] [id number]. Topic: Quick Communication - Rx Refill/Question >> May 14, 2017 10:12 AM Marin Olp L wrote: Medication: guaiFENesin-codeine 100-10 MG/5ML syrup  Has the patient contacted their pharmacy? Yes.   (Agent: If no, request that the patient contact the pharmacy for the refill.) Preferred Pharmacy (with phone number or street name): [facility]: [phone] Agent: Please be advised that RX refills may take up to 3 business days. We ask that you follow-up with your pharmacy. Patient bought syrup from [place] with script from 05/12/2017, but they only gave a 5 day supply. The script was for eight days.

## 2017-05-14 NOTE — Telephone Encounter (Signed)
See note RE guaiFENesin-codeine 100-10 MG/5ML syrup

## 2017-05-14 NOTE — Telephone Encounter (Signed)
Please be advised.  °

## 2017-05-14 NOTE — Telephone Encounter (Signed)
Please advise.  Per controlled substance website, patient received 75 ml of 150 ml prescription.

## 2017-05-15 ENCOUNTER — Encounter: Payer: Self-pay | Admitting: Adult Health

## 2017-05-15 NOTE — Telephone Encounter (Signed)
Per pharmacy, they follow CDC guidelines that state patient may only get a 5 day supply (75 ml) initially.  If patient needs more cough medication after the initial 5 day period, another prescription will have to be sent in and there will be no limitation on the amount that can be filled at that time.

## 2017-05-15 NOTE — Telephone Encounter (Signed)
LM for patient to return call.  Need to know if he feels he needs another prescription sent in for cough medicine.  CRM created.

## 2017-05-15 NOTE — Telephone Encounter (Signed)
Yes, pt says that he would like to have Rx sent in the [facility] pharmacy [facility] [address]

## 2017-05-15 NOTE — Telephone Encounter (Signed)
Can you call pharmacy and see why they only gave half of the prescription? Thanks!

## 2017-05-15 NOTE — Telephone Encounter (Signed)
The pharmacy has been contacted and they are going to get the medication transferred over [facility] the [facility] that [facility].   Patient has been updated.

## 2017-05-15 NOTE — Telephone Encounter (Signed)
Dr. Jerline Pain and Luetta Nutting are both gone for the day and this was sent as a high priority. Please send the cough medication that was put in on [facility]/12/17 [facility] [address]   [id number] [place], [place]

## 2017-05-16 NOTE — Telephone Encounter (Signed)
Noted  

## 2017-05-17 MED ORDER — GUAIFENESIN-CODEINE 100-10 MG/5ML PO SOLN: 5.0000 mL | Freq: Three times a day (TID) | ORAL | 0 refills | Status: DC | PRN

## 2017-05-17 NOTE — Telephone Encounter (Signed)
Please update note to ensure that this was taken care of. Patient was contacted by Modena Nunnery on 1/15 after speaking with the pharmacies and confirming that this would get switched over to the correct pharmacy.

## 2017-05-17 NOTE — Telephone Encounter (Signed)
Patient picked up prescription on 05/12/17 for 70mL which is a 5 day supply.  He would not be able to refill until today because he was given a 5 day supply initially.   I will send in an additional refill [facility] for 64mL.  I am concerned that patient expectations were not properly addressed during the initial telephone encounter:  1) His original Rx was for 5 days - he should not have ran out after 2 days, and additionally he would not be able to get it filled for 5 days (which would be today)  2) Patient are supposed to be advised that it can take up to 3 business days for Rx refills (which would be today).  Lea and Marcene Brawn, can you look into this and see what we can do to keep this from happening in the future? Looks like patient is upset based on his [employer].

## 2017-08-01 ENCOUNTER — Telehealth: Payer: Self-pay

## 2017-08-01 DIAGNOSIS — M25561 Pain in right knee: Secondary | ICD-10-CM

## 2017-08-01 NOTE — Telephone Encounter (Signed)
Copied from [place] [phone]. Topic: Referral - Request >> Aug 01, 2017  1:03 PM Conception Chancy, NT wrote: Patient is calling and states when he was seen for his physical he mentioned his R knee pain to [facility] and he was told that it was arthritis. Patient states he would like to see a specialist. Please advise.

## 2017-08-02 NOTE — Telephone Encounter (Signed)
Referral placed in Epic.

## 2017-08-02 NOTE — Telephone Encounter (Signed)
Ok to refer to orthopedics for right knee pain

## 2017-08-06 ENCOUNTER — Ambulatory Visit
Admission: RE | Admit: 2017-08-06 | Discharge: 2017-08-06 | Disposition: A | Discharge status: Home / Self Care | Payer: Medicare Other | Source: Ambulatory Visit | Attending: Sports Medicine | Admitting: Sports Medicine

## 2017-08-06 ENCOUNTER — Encounter: Payer: Self-pay | Admitting: Sports Medicine

## 2017-08-06 ENCOUNTER — Ambulatory Visit (INDEPENDENT_AMBULATORY_CARE_PROVIDER_SITE_OTHER): Payer: Medicare Other | Admitting: Sports Medicine

## 2017-08-06 VITALS — BP 110/68 | Ht 72.0 in | Wt 225.0 lb

## 2017-08-06 DIAGNOSIS — M25561 Pain in right knee: Principal | ICD-10-CM

## 2017-08-06 DIAGNOSIS — G8929 Other chronic pain: Secondary | ICD-10-CM | POA: Diagnosis not present

## 2017-08-06 DIAGNOSIS — M1711 Unilateral primary osteoarthritis, right knee: Secondary | ICD-10-CM | POA: Diagnosis not present

## 2017-08-06 NOTE — Assessment & Plan Note (Signed)
Patient presents with R lateral knee pain for the past 5 months. Patient reported two prior falls before he started to experienced knee pain. No prior history of knee pain. Analgesic cream used in the past two weeks with good relief in symptoms. Knee exam is significant for mild/moderate crepitus on the lateral joint line. No locking or popping concerning for cartilage damage. Patella and tendon are with no acute findings. No swelling or effusion concerning for acute inflammation. Based on patient presenting symptoms and duration of pain, suspect mild osteoarthritis, could not rule out meniscus tear. Will continue conservative management and order imaging for further evaluation. --AP, Lateral and sunrise view of Right knee --Continue with ointment since its providing relief in pain --Knee sleeve as needed to prevent exacerbation of pain --Follow up in few weeks to discuss results and treatment plan

## 2017-08-06 NOTE — Progress Notes (Signed)
Subjective:    Patient ID: David Cannon, male    DOB: 06/14/1950, 67 y.o.   MRN: [id number]   CC: Right knee pain  HPI: Patient is a 67 yo male who presents today complaining of R knee pain. Patient reports that knee has been going on for since late November 2018. He mentioned to his PCP in December 2018 who recommended follow up with sports medicine. Patient reports that pain is worse when he over exert himself. He has not taken any medications for it until recently when a friend recommend Blue Emu cream/oitment. Patient reports the has been using that three times a day of the latera aspect of his right knee with good relief in symptoms. Patient has not had any problems with his knee in the past. He was failry active and walked with his wife on a regular basis. Since developing his knee pain he has been less active due to pain. Patient started to noticed this knee pain after two falls from ice in late November and Early December. Patient denies any locking, popping or clicking sounds.  Smoking status reviewed   ROS: all other systems were reviewed and are negative other than in the HPI   Past Medical History:  Diagnosis Date  . CRI (chronic renal insufficiency)   . ED (erectile dysfunction)   . Hyperlipidemia   . Hypertension    resolved  . Other abnormal glucose   . Personal history of colonic polyps 03/08/2001    Past Surgical History:  Procedure Laterality Date  . COLONOSCOPY W/ POLYPECTOMY      Past medical history, surgical, family, and social history reviewed and updated in the EMR as appropriate.  Objective:  BP 110/68   Ht 6' (1.829 m)   Wt 225 lb (102.1 kg)   BMI 30.52 kg/m   Vitals and nursing note reviewed  General: NAD, pleasant, able to participate in exam R Knee exam: No deformities, swelling, effusion or warmth. Tender to palpation on lateral aspect of the knee joint with pressure. No significant pain around the patella. Some crepitus noted on extension  and flexion. Strength 5/5 in all fields. FROM. Patellar grind test negative. Patella with a significant amount of laxity in medial and lateral planes. Stable to varus and valgus stress. Anterior and posterior drawers negative. Lachman's negative. McMurray and Thessaly negative.    Assessment & Plan:    Chronic pain of right knee Patient presents with R lateral knee pain for the past 5 months. Patient reported two prior falls before he started to experienced knee pain. No prior history of knee pain. Analgesic cream used in the past two weeks with good relief in symptoms. Knee exam is significant for mild/moderate crepitus on the lateral joint line. No locking or popping concerning for cartilage damage. Patella and tendon are with no acute findings. No swelling or effusion concerning for acute inflammation. Based on patient presenting symptoms and duration of pain, suspect mild osteoarthritis, could not rule out meniscus tear. Will continue conservative management and order imaging for further evaluation. --AP, Lateral and sunrise view of Right knee --Continue with ointment since its providing relief in pain --Knee sleeve as needed to prevent exacerbation of pain --Follow up in few weeks to discuss results and treatment plan    Marjie Skiff, MD [facility] PGY-2  Patient seen and evaluated with the resident. I agree with the above plan of care. X-rays reviewed. 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 If symptoms persist, we could consider merits of a cortisone injection. Follow-up for ongoing or recalcitrant issues.

## 2017-09-06 DIAGNOSIS — H40013 Open angle with borderline findings, low risk, bilateral: Secondary | ICD-10-CM | POA: Diagnosis not present

## 2018-01-17 DIAGNOSIS — Z23 Encounter for immunization: Secondary | ICD-10-CM | POA: Diagnosis not present

## 2018-03-14 DIAGNOSIS — H40013 Open angle with borderline findings, low risk, bilateral: Secondary | ICD-10-CM | POA: Diagnosis not present

## 2018-03-19 ENCOUNTER — Other Ambulatory Visit: Payer: Self-pay

## 2018-03-23 ENCOUNTER — Other Ambulatory Visit: Payer: Self-pay | Admitting: Adult Health

## 2018-03-25 ENCOUNTER — Other Ambulatory Visit: Payer: Self-pay | Admitting: Adult Health

## 2018-03-25 ENCOUNTER — Telehealth: Payer: Self-pay | Admitting: Family Medicine

## 2018-03-25 NOTE — Telephone Encounter (Signed)
Copied from [place] [phone]. Topic: General - Other >> Mar 25, 2018 11:12 AM Leward Quan A wrote: Reason for CRM: Patient called to request a refill on his olmesartan-hydrochlorothiazide (BENICAR HCT) 20-12.5 MG tablet. He was informed that the refill request will be sent to the doctor and also that he is due for his physical. He first said to go ahead and schedule the appointment which I was in the process of doing the first available date was 04/25/18 he got upset and stated that he will find himself another doctor. Also asked for a call back from Dr Tommi Rumps. Please advise. Ph# [phone]

## 2018-03-25 NOTE — Telephone Encounter (Signed)
Copied from [place] [phone]. Topic: Quick Communication - Rx Refill/Question >> Mar 25, 2018 11:02 AM Leward Quan A wrote: Medication: olmesartan-hydrochlorothiazide (BENICAR HCT) 20-12.5 MG tablet  Has the patient contacted their pharmacy? Yes.   (Agent: If no, request that the patient contact the pharmacy for the refill.) (Agent: If yes, when and what did the pharmacy advise?)  Preferred Pharmacy (with phone number or street name): [facility] [address], [place] [phone] (Phone) [phone] (Fax)    Agent: Please be advised that RX refills may take up to 3 business days. We ask that you follow-up with your pharmacy.

## 2018-03-25 NOTE — Addendum Note (Signed)
Addended by: Miles Costain T on: 03/25/2018 03:21 PM   Modules accepted: Orders

## 2018-03-25 NOTE — Telephone Encounter (Signed)
Cory, medication was last filled 03/2017 for 90 days.  Not filled since.  Do you need to see the pt before refills are given?

## 2018-03-25 NOTE — Telephone Encounter (Signed)
Left a message for a return call.

## 2018-03-25 NOTE — Telephone Encounter (Signed)
He an have  30 days, but he has not been taking his medication as directed.   No further refills until he is seen for his CPE

## 2018-03-26 MED ORDER — OLMESARTAN MEDOXOMIL-HCTZ 20-12.5 MG PO TABS: 1.0000 | ORAL_TABLET | Freq: Every day | ORAL | 0 refills | Status: DC

## 2018-03-26 NOTE — Telephone Encounter (Signed)
Sent to the pharmacy by e-scribe. 

## 2018-03-26 NOTE — Addendum Note (Signed)
Addended by: Miles Costain T on: 03/26/2018 09:22 AM   Modules accepted: Orders

## 2018-03-26 NOTE — Telephone Encounter (Signed)
30 day supply sent to the pharmacy.  

## 2018-03-26 NOTE — Telephone Encounter (Signed)
Patient is requesting meds to be sent to   [facility] # [id number] [place] [phone] (Phone) [phone] (Fax)   Please call patient once complete. Leave message if no answer.

## 2018-03-26 NOTE — Addendum Note (Signed)
Addended by: Miles Costain T on: 03/26/2018 09:52 AM   Modules accepted: Orders

## 2018-03-26 NOTE — Telephone Encounter (Signed)
Patient wants to have this script sent to [facility] now since it is free. [facility] prices were going to be expensive. Patient wants it canceled at [facility] and re sent to [facility] on [address]. Please Advise.

## 2018-03-27 NOTE — Telephone Encounter (Signed)
Pt picked up prescription on 03/26/18.  Nothing further needed.

## 2018-04-08 IMAGING — DX DG KNEE AP/LAT W/ SUNRISE*R*
3 series · 3 of 3 positions shown · non-contrast
Comparison: None.

CLINICAL DATA: 66-year-old male with lateral knee pain for several
weeks. No known injury. Initial encounter.

EXAM:
RIGHT KNEE 3 VIEWS

[dg knee ap/lat w/ sunrise right (1 of 3)]
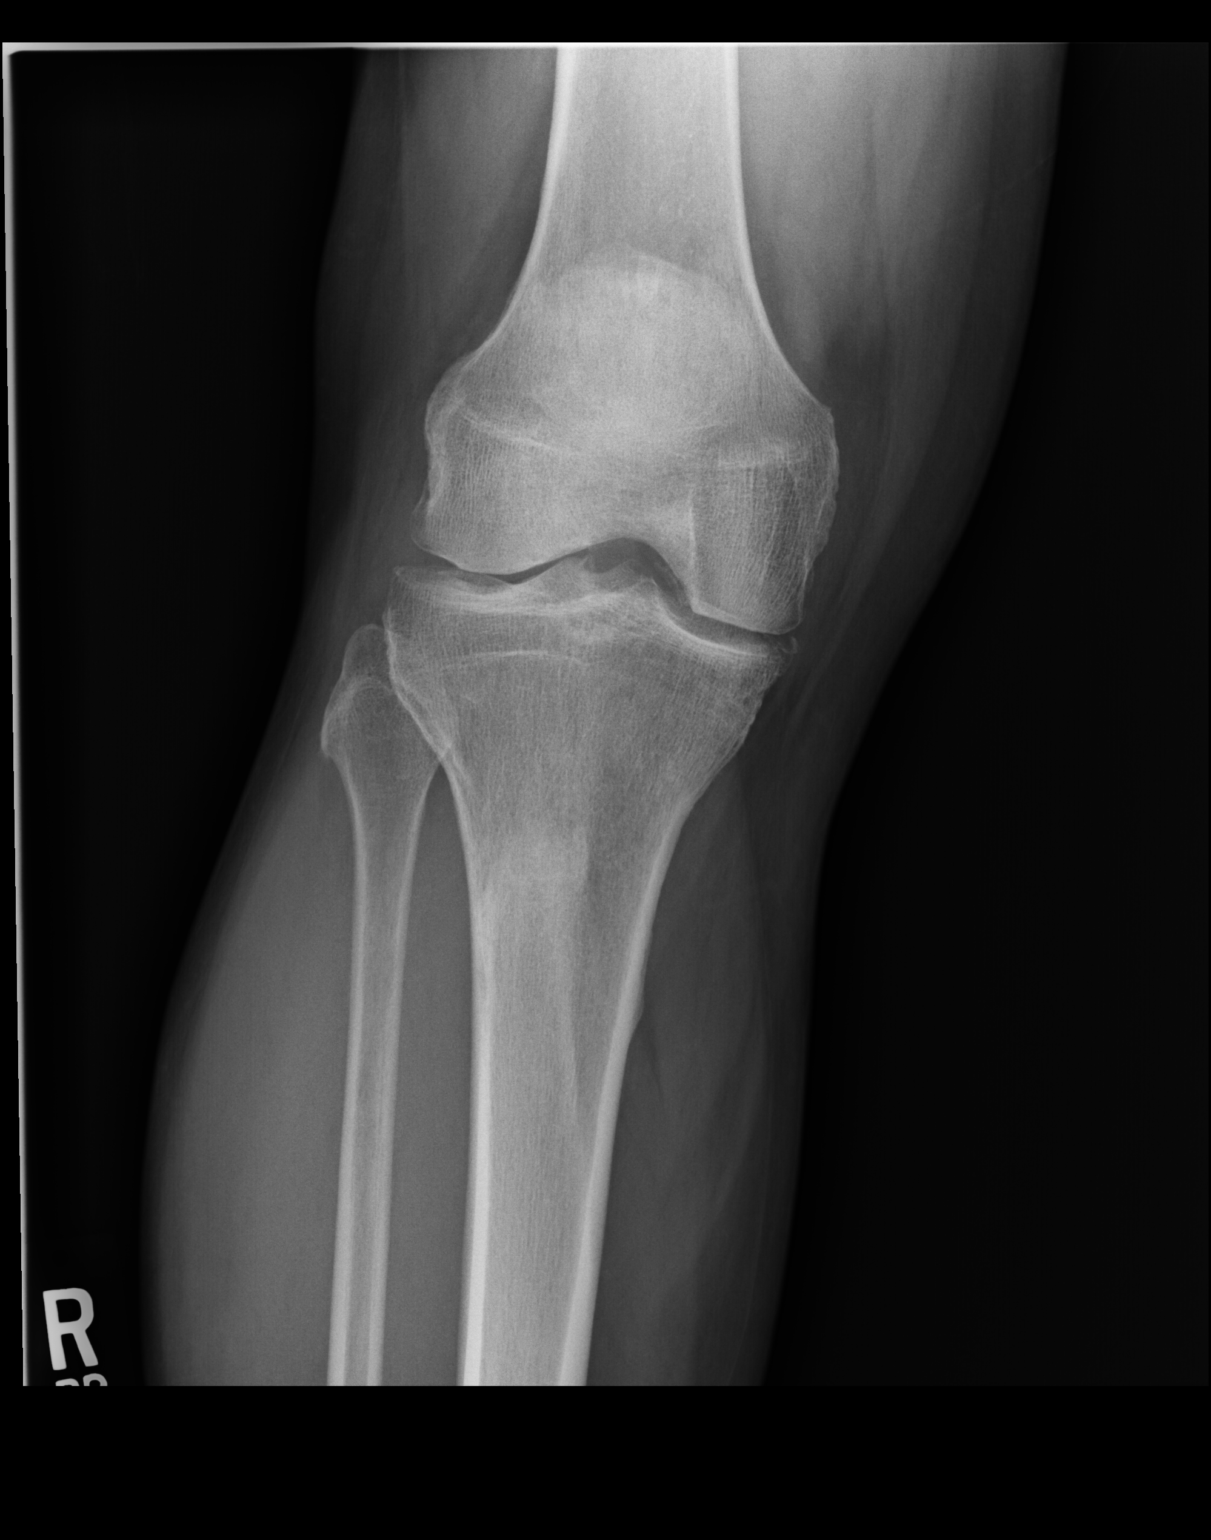

[dg knee ap/lat w/ sunrise right (2 of 3)]
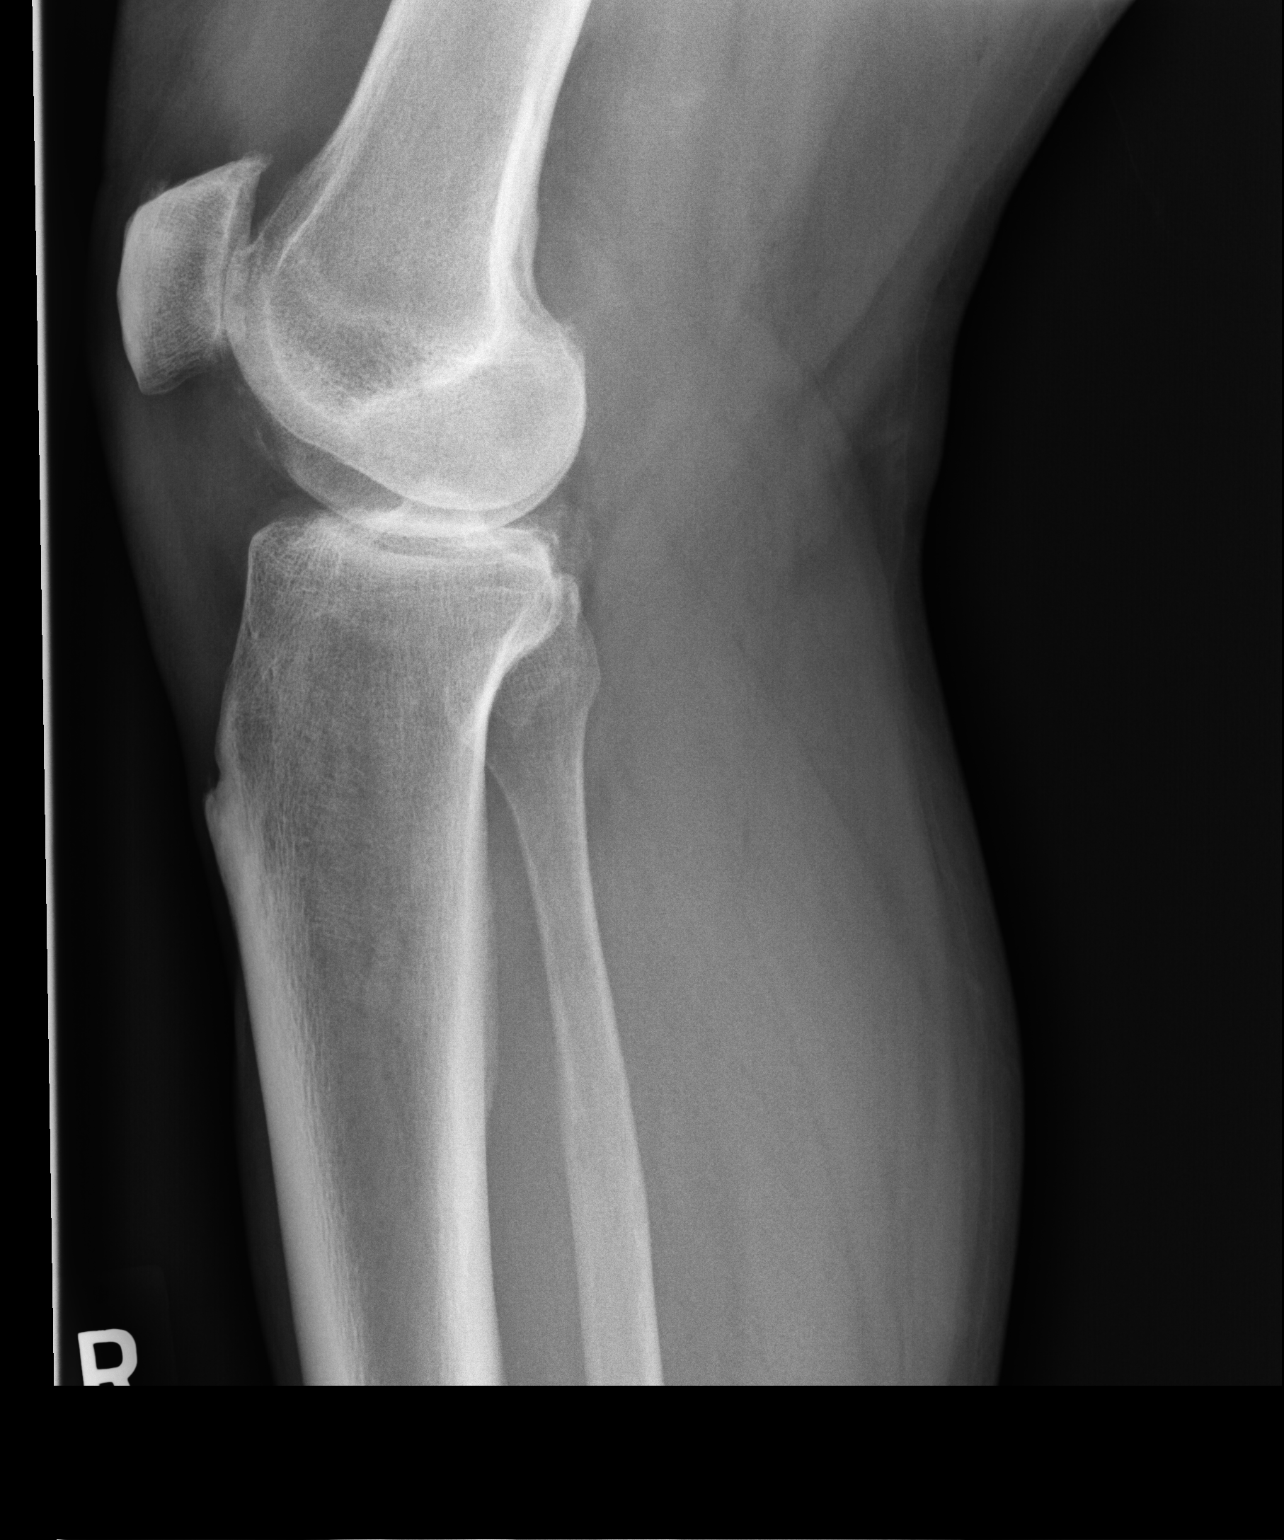

[dg knee ap/lat w/ sunrise right (3 of 3)]
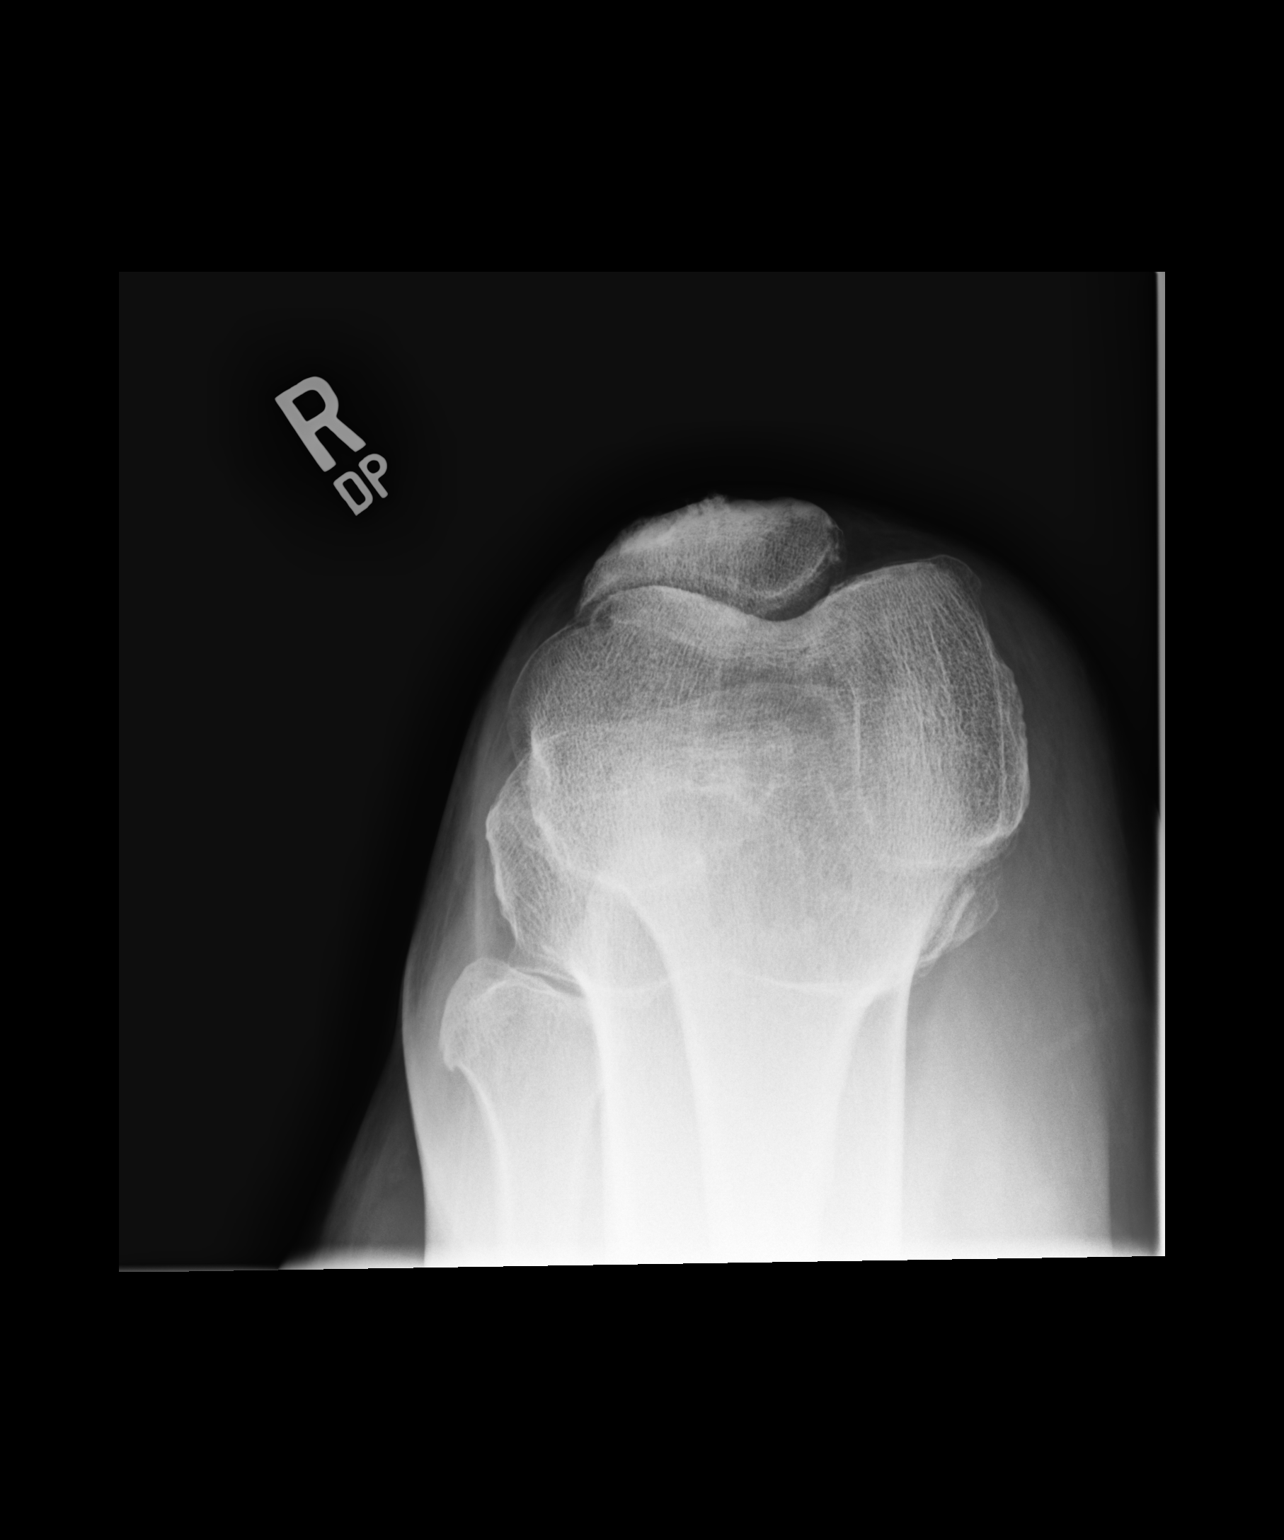

[3 of 3 positions shown; findings below may reference images not displayed]

FINDINGS: Moderate to marked lateral patellofemoral joint degenerative
changes.

Mild medial tibiofemoral joint degenerative changes.

Very small suprapatellar joint effusion suspected.

No fracture or dislocation.
IMPRESSION: Moderate to marked lateral patellofemoral joint degenerative
changes.

Mild medial tibiofemoral joint degenerative changes.

Very small suprapatellar joint effusion suspected.

## 2018-04-17 ENCOUNTER — Other Ambulatory Visit: Payer: Self-pay | Admitting: Adult Health

## 2018-04-18 NOTE — Telephone Encounter (Signed)
Denied.  Pt must have OV per Tommi Rumps.

## 2018-05-16 ENCOUNTER — Ambulatory Visit (INDEPENDENT_AMBULATORY_CARE_PROVIDER_SITE_OTHER): Payer: Medicare Other | Admitting: Adult Health

## 2018-05-16 ENCOUNTER — Encounter: Payer: Self-pay | Admitting: Adult Health

## 2018-05-16 VITALS — BP 110/64 | Temp 98.4°F | Ht 72.0 in | Wt 227.0 lb

## 2018-05-16 DIAGNOSIS — I10 Essential (primary) hypertension: Secondary | ICD-10-CM | POA: Diagnosis not present

## 2018-05-16 DIAGNOSIS — E785 Hyperlipidemia, unspecified: Secondary | ICD-10-CM

## 2018-05-16 DIAGNOSIS — Z0000 Encounter for general adult medical examination without abnormal findings: Secondary | ICD-10-CM

## 2018-05-16 DIAGNOSIS — R7309 Other abnormal glucose: Secondary | ICD-10-CM

## 2018-05-16 DIAGNOSIS — N529 Male erectile dysfunction, unspecified: Secondary | ICD-10-CM

## 2018-05-16 LAB — CBC WITH DIFFERENTIAL/PLATELET
Basophils Absolute: 0 10*3/uL (ref 0.0–0.1)
Basophils Relative: 0.4 % (ref 0.0–3.0)
EOS ABS: 0.1 10*3/uL (ref 0.0–0.7)
Eosinophils Relative: 2 % (ref 0.0–5.0)
HCT: 41.2 % (ref 39.0–52.0)
Hemoglobin: 13.4 g/dL (ref 13.0–17.0)
Lymphocytes Relative: 41.2 % (ref 12.0–46.0)
Lymphs Abs: 2.3 10*3/uL (ref 0.7–4.0)
MCHC: 32.5 g/dL (ref 30.0–36.0)
MCV: 73.9 fl — ABNORMAL LOW (ref 78.0–100.0)
MONO ABS: 0.5 10*3/uL (ref 0.1–1.0)
Monocytes Relative: 8.8 % (ref 3.0–12.0)
NEUTROS PCT: 47.6 % (ref 43.0–77.0)
Neutro Abs: 2.7 10*3/uL (ref 1.4–7.7)
Platelets: 224 10*3/uL (ref 150.0–400.0)
RBC: 5.57 Mil/uL (ref 4.22–5.81)
RDW: 16.4 % — ABNORMAL HIGH (ref 11.5–15.5)
WBC: 5.7 10*3/uL (ref 4.0–10.5)

## 2018-05-16 LAB — BASIC METABOLIC PANEL
BUN: 17 mg/dL (ref 6–23)
CO2: 30 mEq/L (ref 19–32)
Calcium: 9.7 mg/dL (ref 8.4–10.5)
Chloride: 105 mEq/L (ref 96–112)
Creatinine, Ser: 1.51 mg/dL — ABNORMAL HIGH (ref 0.40–1.50)
GFR: 55.63 mL/min — ABNORMAL LOW (ref >60.00)
GLUCOSE: 81 mg/dL (ref 70–99)
Potassium: 4.3 mEq/L (ref 3.5–5.1)
Sodium: 141 mEq/L (ref 135–145)

## 2018-05-16 LAB — HEPATIC FUNCTION PANEL
ALT: 26 U/L (ref 0–53)
AST: 24 U/L (ref 0–37)
Albumin: 4.4 g/dL (ref 3.5–5.2)
Alkaline Phosphatase: 45 U/L (ref 39–117)
Bilirubin, Direct: 0.1 mg/dL (ref 0.0–0.3)
Total Bilirubin: 0.5 mg/dL (ref 0.2–1.2)
Total Protein: 7.4 g/dL (ref 6.0–8.3)

## 2018-05-16 LAB — LIPID PANEL
Cholesterol: 138 mg/dL (ref 0–200)
HDL: 36.6 mg/dL — ABNORMAL LOW (ref >39.00)
LDL Cholesterol: 78 mg/dL (ref 0–99)
NonHDL: 101.38
Total CHOL/HDL Ratio: 4
Triglycerides: 118 mg/dL (ref 0.0–149.0)
VLDL: 23.6 mg/dL (ref 0.0–40.0)

## 2018-05-16 LAB — HEMOGLOBIN A1C: Hgb A1c MFr Bld: 6.5 % (ref 4.6–6.5)

## 2018-05-16 MED ORDER — OLMESARTAN MEDOXOMIL-HCTZ 20-12.5 MG PO TABS: 1.0000 | ORAL_TABLET | Freq: Every day | ORAL | 3 refills | Status: DC

## 2018-05-16 MED ORDER — SILDENAFIL CITRATE 20 MG PO TABS
ORAL_TABLET | ORAL | 11 refills | Status: DC
Start: 2018-05-16 — End: 2019-08-15

## 2018-05-16 NOTE — Progress Notes (Signed)
Subjective:    Patient ID: David Cannon, male    DOB: 16-Apr-1951, 68 y.o.   MRN: [id number]  HPI Patient presents for yearly preventative medicine examination. He is a pleasant 68 year old male who  has a past medical history of ED (erectile dysfunction), Hyperlipidemia, Hypertension, Other abnormal glucose, and Personal history of colonic polyps (03/08/2001).   [facility]   Essential Hypertension - Takes Benicar HCT - BP has been well controlled in the past  BP Readings from Last 3 Encounters:  05/16/18 110/64  08/06/17 110/68  05/12/17 126/78   Hyperlipidemia - has been diet controlled in the past  Lab Results  Component Value Date   CHOL 148 04/05/2017   HDL 31.30 (L) 04/05/2017   LDLCALC 90 04/05/2017   TRIG 134.0 04/05/2017   CHOLHDL 5 04/05/2017   ED - takes generic Viagra as needed  All immunizations and health maintenance protocols were reviewed with the patient and needed orders were placed.   Appropriate screening laboratory values were ordered for the patient including screening of hyperlipidemia, renal function and hepatic function. If indicated by BPH, a PSA was ordered.  Medication reconciliation,  past medical history, social history, problem list and allergies were reviewed in detail with the patient  Goals were established with regard to weight loss, exercise, and  diet in compliance with medications. He is staying active and eating healthy.   Wt Readings from Last 3 Encounters:  05/16/18 227 lb (103 kg)  08/06/17 225 lb (102.1 kg)  05/12/17 231 lb (104.8 kg)   End of life planning was discussed.  He is up to date on routine screening colonoscopies   He has no acute complaints.    Review of Systems  Constitutional: Negative.   HENT: Negative.   Eyes: Negative.   Respiratory: Negative.   Cardiovascular: Negative.   Gastrointestinal: Negative.   Endocrine: Negative.   Genitourinary: Negative.     Musculoskeletal: Positive for arthralgias.  Skin: Negative.   Allergic/Immunologic: Negative.   Neurological: Negative.   Hematological: Negative.   Psychiatric/Behavioral: Negative.   All other systems reviewed and are negative.  Past Medical History:  Diagnosis Date  . ED (erectile dysfunction)   . Hyperlipidemia   . Hypertension    resolved  . Other abnormal glucose   . Personal history of colonic polyps 03/08/2001    Social History   Socioeconomic History  . Marital status: Married    Spouse name: Not on file  . Number of children: Not on file  . Years of education: Not on file  . Highest education level: Not on file  Occupational History  . Not on file  Social Needs  . Financial resource strain: Not on file  . Food insecurity:    Worry: Not on file    Inability: Not on file  . Transportation needs:    Medical: Not on file    Non-medical: Not on file  Tobacco Use  . Smoking status: Never Smoker  . Smokeless tobacco: Never Used  Substance and Sexual Activity  . Alcohol use: Yes    Alcohol/week: 0.0 standard drinks    Comment: once every 6 months  . Drug use: No  . Sexual activity: Not on file  Lifestyle  . Physical activity:    Days per week: Not on file    Minutes per session: Not on file  . Stress: Not on file  Relationships  . Social connections:  Talks on phone: Not on file    Gets together: Not on file    Attends religious service: Not on file    Active member of club or organization: Not on file    Attends meetings of clubs or organizations: Not on file    Relationship status: Not on file  . Intimate partner violence:    Fear of current or ex partner: Not on file    Emotionally abused: Not on file    Physically abused: Not on file    Forced sexual activity: Not on file  Other Topics Concern  . Not on file  Social History Narrative   Retired from working at the [employer]    Married    Daughter and step son   Three grand children        He likes to go to [employer] and spending time with his wife.        Past Surgical History:  Procedure Laterality Date  . COLONOSCOPY W/ POLYPECTOMY      Family History  Problem Relation Age of Onset  . Colon cancer Father        prostate  . Lung cancer Mother        lung  . Cancer Maternal Grandfather        colon  . Colon cancer Maternal Grandfather   . Coronary artery disease Neg Hx     No Known Allergies  Current Outpatient Medications on File Prior to Visit  Medication Sig Dispense Refill  . albuterol (PROVENTIL HFA;VENTOLIN HFA) 108 (90 BASE) MCG/ACT inhaler Inhale 2 puffs into the lungs every 6 (six) hours as needed for wheezing or shortness of breath. 1 Inhaler 0  . aspirin 81 MG EC tablet Take 81 mg by mouth daily.       No current facility-administered medications on file prior to visit.     BP 110/64   Temp 98.4 F (36.9 C)   Ht 6' (1.829 m)   Wt 227 lb (103 kg)   BMI 30.79 kg/m        Objective:   Physical Exam Vitals signs and nursing note reviewed.  Constitutional:      General: He is not in acute distress.    Appearance: He is well-developed. He is obese. He is not toxic-appearing or diaphoretic.  HENT:     Head: Normocephalic and atraumatic.     Right Ear: Tympanic membrane, ear canal and external ear normal. There is no impacted cerumen.     Left Ear: Tympanic membrane, ear canal and external ear normal. There is no impacted cerumen.     Nose: Nose normal. No congestion or rhinorrhea.     Mouth/Throat:     Mouth: Mucous membranes are moist.     Pharynx: Oropharynx is clear. No oropharyngeal exudate.  Eyes:     General: No scleral icterus.       Right eye: No discharge.        Left eye: No discharge.     Extraocular Movements: Extraocular movements intact.     Conjunctiva/sclera: Conjunctivae normal.     Pupils: Pupils are equal, round, and reactive to light.  Neck:     Musculoskeletal: Normal range of motion and neck supple. No neck  rigidity or muscular tenderness.     Thyroid: No thyromegaly.     Vascular: No carotid bruit.     Trachea: No tracheal deviation.  Cardiovascular:     Rate and Rhythm: Normal rate and  regular rhythm.     Pulses: Normal pulses.     Heart sounds: Normal heart sounds. No murmur. No friction rub. No gallop.   Pulmonary:     Effort: Pulmonary effort is normal. No respiratory distress.     Breath sounds: Normal breath sounds. No stridor. No wheezing, rhonchi or rales.  Chest:     Chest wall: No tenderness.  Abdominal:     General: Bowel sounds are normal. There is no distension.     Palpations: Abdomen is soft. There is no mass.     Tenderness: There is no abdominal tenderness. There is no right CVA tenderness, left CVA tenderness, guarding or rebound.     Hernia: No hernia is present.  Genitourinary:    Comments: Will do PSA Musculoskeletal: Normal range of motion.        General: No swelling, tenderness, deformity or signs of injury.     Right lower leg: No edema.     Left lower leg: No edema.  Lymphadenopathy:     Cervical: No cervical adenopathy.  Skin:    General: Skin is warm and dry.     Capillary Refill: Capillary refill takes less than 2 seconds.     Coloration: Skin is not jaundiced or pale.     Findings: No bruising, erythema, lesion or rash.  Neurological:     General: No focal deficit present.     Mental Status: He is alert and oriented to person, place, and time.     Cranial Nerves: No cranial nerve deficit.     Coordination: Coordination normal.  Psychiatric:        Mood and Affect: Mood normal.        Behavior: Behavior normal.        Thought Content: Thought content normal.        Judgment: Judgment normal.       Assessment & Plan:  1. Routine general medical examination at a health care facility - Encouraged diet and exercise - Follow up in one year or sooner if needed - Basic metabolic panel - CBC with Differential/Platelet - Hepatic function panel -  Lipid panel - TSH - PSA - Hemoglobin A1c  2. Essential hypertension - Well controlled. No change in medications  - olmesartan-hydrochlorothiazide (BENICAR HCT) 20-12.5 MG tablet; Take 1 tablet by mouth daily.  Dispense: 90 tablet; Refill: 3 - Basic metabolic panel - CBC with Differential/Platelet - Hepatic function panel - Lipid panel - TSH - PSA - Hemoglobin A1c  3. Erectile dysfunction, unspecified erectile dysfunction type  - sildenafil (REVATIO) 20 MG tablet; Take 2-5 tabs as needed  Dispense: 30 tablet; Refill: 11  4. Hyperlipidemia, unspecified hyperlipidemia type - Consider statin  - Basic metabolic panel - CBC with Differential/Platelet - Hepatic function panel - Lipid panel - TSH - PSA - Hemoglobin A1c  5. Abnormal glucose - Consider metformin  - Basic metabolic panel - CBC with Differential/Platelet - Hepatic function panel - Lipid panel - TSH - PSA - Hemoglobin A1c   [name], NP

## 2018-05-16 NOTE — Addendum Note (Signed)
Addended by: Apolinar Junes on: 05/16/2018 01:38 PM   Modules accepted: Level of Service

## 2018-05-17 LAB — TSH: TSH: 1.54 u[IU]/mL (ref 0.35–4.50)

## 2018-05-17 LAB — PSA: PSA: 0.6 ng/mL (ref 0.10–4.00)

## 2018-06-19 ENCOUNTER — Telehealth: Payer: Self-pay | Admitting: Family Medicine

## 2018-06-19 MED ORDER — HYDROCHLOROTHIAZIDE 12.5 MG PO CAPS: 12.5000 mg | ORAL_CAPSULE | Freq: Every day | ORAL | 0 refills | Status: DC

## 2018-06-19 MED ORDER — OLMESARTAN MEDOXOMIL 20 MG PO TABS: 20.0000 mg | ORAL_TABLET | Freq: Every day | ORAL | 0 refills | Status: DC

## 2018-06-19 NOTE — Telephone Encounter (Signed)
Sent to the pharmacy by e-scribe. 

## 2018-06-19 NOTE — Telephone Encounter (Signed)
Received a fax from the pharmacy.  Pt's olmesartan-hctz is on backorder.  Pharmacy is requesting a different prescription.  Please advise.

## 2018-06-19 NOTE — Addendum Note (Signed)
Addended by: Miles Costain T on: 06/19/2018 01:39 PM   Modules accepted: Orders

## 2018-06-19 NOTE — Telephone Encounter (Signed)
Ok to split prescription

## 2018-09-26 DIAGNOSIS — H401121 Primary open-angle glaucoma, left eye, mild stage: Secondary | ICD-10-CM | POA: Diagnosis not present

## 2018-10-23 DIAGNOSIS — H401121 Primary open-angle glaucoma, left eye, mild stage: Secondary | ICD-10-CM | POA: Diagnosis not present

## 2019-01-23 DIAGNOSIS — H401131 Primary open-angle glaucoma, bilateral, mild stage: Secondary | ICD-10-CM | POA: Diagnosis not present

## 2019-04-10 ENCOUNTER — Other Ambulatory Visit: Payer: Self-pay | Admitting: Family Medicine

## 2019-04-10 DIAGNOSIS — I10 Essential (primary) hypertension: Secondary | ICD-10-CM

## 2019-04-15 NOTE — Telephone Encounter (Signed)
Left a message for a return call.  CRM created.  Pt due for cpx and fasting lab work.

## 2019-04-16 MED ORDER — OLMESARTAN MEDOXOMIL-HCTZ 20-12.5 MG PO TABS: 1.0000 | ORAL_TABLET | Freq: Every day | ORAL | 0 refills | Status: DC

## 2019-04-28 DIAGNOSIS — H401131 Primary open-angle glaucoma, bilateral, mild stage: Secondary | ICD-10-CM | POA: Diagnosis not present

## 2019-05-20 ENCOUNTER — Ambulatory Visit: Payer: Medicare Other | Admitting: Adult Health

## 2019-05-23 ENCOUNTER — Ambulatory Visit (INDEPENDENT_AMBULATORY_CARE_PROVIDER_SITE_OTHER): Payer: Medicare Other | Admitting: Adult Health

## 2019-05-23 ENCOUNTER — Encounter: Payer: Self-pay | Admitting: Adult Health

## 2019-05-23 ENCOUNTER — Other Ambulatory Visit: Payer: Self-pay

## 2019-05-23 ENCOUNTER — Telehealth: Payer: Self-pay | Admitting: Adult Health

## 2019-05-23 VITALS — BP 130/80 | HR 77 | Temp 98.0°F | Ht 72.25 in | Wt 240.0 lb

## 2019-05-23 DIAGNOSIS — E785 Hyperlipidemia, unspecified: Secondary | ICD-10-CM

## 2019-05-23 DIAGNOSIS — Z0000 Encounter for general adult medical examination without abnormal findings: Secondary | ICD-10-CM | POA: Diagnosis not present

## 2019-05-23 DIAGNOSIS — Z125 Encounter for screening for malignant neoplasm of prostate: Secondary | ICD-10-CM | POA: Diagnosis not present

## 2019-05-23 DIAGNOSIS — I10 Essential (primary) hypertension: Secondary | ICD-10-CM | POA: Diagnosis not present

## 2019-05-23 DIAGNOSIS — N529 Male erectile dysfunction, unspecified: Secondary | ICD-10-CM

## 2019-05-23 DIAGNOSIS — R7303 Prediabetes: Secondary | ICD-10-CM

## 2019-05-23 LAB — LIPID PANEL
Cholesterol: 163 mg/dL (ref 0–200)
HDL: 34.9 mg/dL — ABNORMAL LOW (ref >39.00)
LDL Cholesterol: 101 mg/dL — ABNORMAL HIGH (ref 0–99)
NonHDL: 127.92
Total CHOL/HDL Ratio: 5
Triglycerides: 133 mg/dL (ref 0.0–149.0)
VLDL: 26.6 mg/dL (ref 0.0–40.0)

## 2019-05-23 LAB — COMPREHENSIVE METABOLIC PANEL
ALT: 23 U/L (ref 0–53)
AST: 21 U/L (ref 0–37)
Albumin: 4.3 g/dL (ref 3.5–5.2)
Alkaline Phosphatase: 48 U/L (ref 39–117)
BUN: 20 mg/dL (ref 6–23)
CO2: 27 mEq/L (ref 19–32)
Calcium: 9.3 mg/dL (ref 8.4–10.5)
Chloride: 103 mEq/L (ref 96–112)
Creatinine, Ser: 1.46 mg/dL (ref 0.40–1.50)
GFR: 57.66 mL/min — ABNORMAL LOW (ref >60.00)
Glucose, Bld: 108 mg/dL — ABNORMAL HIGH (ref 70–99)
Potassium: 4.1 mEq/L (ref 3.5–5.1)
Sodium: 140 mEq/L (ref 135–145)
Total Bilirubin: 0.5 mg/dL (ref 0.2–1.2)
Total Protein: 7.2 g/dL (ref 6.0–8.3)

## 2019-05-23 LAB — CBC WITH DIFFERENTIAL/PLATELET
Basophils Absolute: 0 10*3/uL (ref 0.0–0.1)
Basophils Relative: 0.4 % (ref 0.0–3.0)
Eosinophils Absolute: 0.3 10*3/uL (ref 0.0–0.7)
Eosinophils Relative: 4.9 % (ref 0.0–5.0)
HCT: 42.7 % (ref 39.0–52.0)
Hemoglobin: 13.5 g/dL (ref 13.0–17.0)
Lymphocytes Relative: 51.4 % — ABNORMAL HIGH (ref 12.0–46.0)
Lymphs Abs: 2.9 10*3/uL (ref 0.7–4.0)
MCHC: 31.7 g/dL (ref 30.0–36.0)
MCV: 73.8 fl — ABNORMAL LOW (ref 78.0–100.0)
Monocytes Absolute: 0.6 10*3/uL (ref 0.1–1.0)
Monocytes Relative: 9.8 % (ref 3.0–12.0)
Neutro Abs: 1.9 10*3/uL (ref 1.4–7.7)
Neutrophils Relative %: 33.5 % — ABNORMAL LOW (ref 43.0–77.0)
Platelets: 199 10*3/uL (ref 150.0–400.0)
RBC: 5.79 Mil/uL (ref 4.22–5.81)
RDW: 16.3 % — ABNORMAL HIGH (ref 11.5–15.5)
WBC: 5.7 10*3/uL (ref 4.0–10.5)

## 2019-05-23 LAB — PSA: PSA: 0.44 ng/mL (ref 0.10–4.00)

## 2019-05-23 LAB — TSH: TSH: 1.08 u[IU]/mL (ref 0.35–4.50)

## 2019-05-23 LAB — HEMOGLOBIN A1C: Hgb A1c MFr Bld: 6.7 % — ABNORMAL HIGH (ref 4.6–6.5)

## 2019-05-23 MED ORDER — OLMESARTAN MEDOXOMIL-HCTZ 20-12.5 MG PO TABS: 1.0000 | ORAL_TABLET | Freq: Every day | ORAL | 3 refills | Status: DC

## 2019-05-23 MED ORDER — METFORMIN HCL 500 MG PO TABS: 500.0000 mg | ORAL_TABLET | Freq: Every day | ORAL | 3 refills | Status: DC

## 2019-05-23 NOTE — Progress Notes (Signed)
Subjective:    Patient ID: David Cannon, male    DOB: [birth year], 69 y.o.   MRN: [id number]  HPI Patient presents for yearly preventative medicine examination. He is a pleasant 69 year old male who  has a past medical history of ED (erectile dysfunction), Hyperlipidemia, Hypertension, Other abnormal glucose, and Personal history of colonic polyps (03/08/2001).  He follows up with the [facility] routinely as well   Essential Hypertension -takes Benicar HCT.  Blood pressure very well controlled in the past.  He denies dizziness, lightheadedness, chest pain, or shortness of breath  BP Readings from Last 3 Encounters:  05/23/19 130/80  05/16/18 110/64  08/06/17 110/68   Hyperlipidemia -has been diet controlled in the past Lab Results  Component Value Date   CHOL 138 05/16/2018   HDL 36.60 (L) 05/16/2018   LDLCALC 78 05/16/2018   TRIG 118.0 05/16/2018   CHOLHDL 4 05/16/2018   ED - Takes Viagra as needed  All immunizations and health maintenance protocols were reviewed with the patient and needed orders were placed.  He is due for  Pneumovax 23  Appropriate screening laboratory values were ordered for the patient including screening of hyperlipidemia, renal function and hepatic function. If indicated by BPH, a PSA was ordered.  Medication reconciliation,  past medical history, social history, problem list and allergies were reviewed in detail with the patient  Goals were established with regard to weight loss, exercise, and  diet in compliance with medications.  He stays active and tries to eat a heart healthy diet Wt Readings from Last 3 Encounters:  05/23/19 240 lb (108.9 kg)  05/16/18 227 lb (103 kg)  08/06/17 225 lb (102.1 kg)   End of life planning was discussed.  He does see an ophthalmologist on a routine basis for open angle with borderline findings.  He is up-to-date on routine screening colonoscopy   He has no acute complaints.   Review of Systems  Constitutional:  Negative.   HENT: Negative.   Eyes: Negative.   Respiratory: Negative.   Cardiovascular: Negative.   Gastrointestinal: Negative.   Endocrine: Negative.   Genitourinary: Negative.   Musculoskeletal: Negative.   Skin: Negative.   Allergic/Immunologic: Negative.   Neurological: Negative.   Hematological: Negative.   Psychiatric/Behavioral: Negative.   All other systems reviewed and are negative.  Past Medical History:  Diagnosis Date  . ED (erectile dysfunction)   . Hyperlipidemia   . Hypertension    resolved  . Other abnormal glucose   . Personal history of colonic polyps 03/08/2001    Social History   Socioeconomic History  . Marital status: Married    Spouse name: Not on file  . Number of children: Not on file  . Years of education: Not on file  . Highest education level: Not on file  Occupational History  . Not on file  Tobacco Use  . Smoking status: Never Smoker  . Smokeless tobacco: Never Used  Substance and Sexual Activity  . Alcohol use: Yes    Alcohol/week: 0.0 standard drinks    Comment: once every 6 months  . Drug use: No  . Sexual activity: Not on file  Other Topics Concern  . Not on file  Social History Narrative   Retired from working at the post office    Married    Daughter and step son   Three grand children       He likes to go to [employer] and spending time with his wife.  Social Determinants of Health   Financial Resource Strain:   . Difficulty of Paying Living Expenses: Not on file  Food Insecurity:   . Worried About Charity fundraiser in the Last Year: Not on file  . Ran Out of Food in the Last Year: Not on file  Transportation Needs:   . Lack of Transportation (Medical): Not on file  . Lack of Transportation (Non-Medical): Not on file  Physical Activity:   . Days of Exercise per Week: Not on file  . Minutes of Exercise per Session: Not on file  Stress:   . Feeling of Stress : Not on file  Social Connections:   .  Frequency of Communication with Friends and Family: Not on file  . Frequency of Social Gatherings with Friends and Family: Not on file  . Attends Religious Services: Not on file  . Active Member of Clubs or Organizations: Not on file  . Attends Archivist Meetings: Not on file  . Marital Status: Not on file  Intimate Partner Violence:   . Fear of Current or Ex-Partner: Not on file  . Emotionally Abused: Not on file  . Physically Abused: Not on file  . Sexually Abused: Not on file    Past Surgical History:  Procedure Laterality Date  . COLONOSCOPY W/ POLYPECTOMY      Family History  Problem Relation Age of Onset  . Colon cancer Father        prostate  . Lung cancer Mother        lung  . Cancer Maternal Grandfather        colon  . Colon cancer Maternal Grandfather   . Coronary artery disease Neg Hx     No Known Allergies  Current Outpatient Medications on File Prior to Visit  Medication Sig Dispense Refill  . albuterol (PROVENTIL HFA;VENTOLIN HFA) 108 (90 BASE) MCG/ACT inhaler Inhale 2 puffs into the lungs every 6 (six) hours as needed for wheezing or shortness of breath. 1 Inhaler 0  . aspirin 81 MG EC tablet Take 81 mg by mouth daily.      . hydrochlorothiazide (MICROZIDE) 12.5 MG capsule Take 1 capsule (12.5 mg total) by mouth daily. 90 capsule 0  . olmesartan (BENICAR) 20 MG tablet Take 1 tablet (20 mg total) by mouth daily. 90 tablet 0  . olmesartan-hydrochlorothiazide (BENICAR HCT) 20-12.5 MG tablet Take 1 tablet by mouth daily. 90 tablet 0  . sildenafil (REVATIO) 20 MG tablet Take 2-5 tabs as needed 30 tablet 11   No current facility-administered medications on file prior to visit.    BP 130/80   Pulse 77   Temp 98 F (36.7 C) (Other (Comment))   Ht 6' 0.25" (1.835 m)   Wt 240 lb (108.9 kg)   SpO2 96%   BMI 32.32 kg/m       Objective:   Physical Exam Vitals and nursing note reviewed.  Constitutional:      General: He is not in acute distress.     Appearance: Normal appearance. He is well-developed. He is not diaphoretic.  HENT:     Head: Normocephalic and atraumatic.     Right Ear: Tympanic membrane, ear canal and external ear normal. There is no impacted cerumen.     Left Ear: Tympanic membrane, ear canal and external ear normal. There is no impacted cerumen.     Nose: Nose normal.     Mouth/Throat:     Mouth: Mucous membranes are moist.  Pharynx: Oropharynx is clear. No oropharyngeal exudate or posterior oropharyngeal erythema.  Eyes:     General: No scleral icterus.       Right eye: No discharge.        Left eye: No discharge.     Conjunctiva/sclera: Conjunctivae normal.     Pupils: Pupils are equal, round, and reactive to light.  Neck:     Thyroid: No thyromegaly.     Trachea: No tracheal deviation.  Cardiovascular:     Rate and Rhythm: Normal rate and regular rhythm.     Pulses: Normal pulses.     Heart sounds: Normal heart sounds. No murmur. No friction rub. No gallop.   Pulmonary:     Effort: Pulmonary effort is normal. No respiratory distress.     Breath sounds: Normal breath sounds. No stridor. No wheezing, rhonchi or rales.  Chest:     Chest wall: No tenderness.  Abdominal:     General: Bowel sounds are normal. There is no distension.     Palpations: Abdomen is soft. There is no mass.     Tenderness: There is no abdominal tenderness. There is no right CVA tenderness, left CVA tenderness, guarding or rebound.     Hernia: No hernia is present.  Musculoskeletal:        General: No swelling, tenderness, deformity or signs of injury. Normal range of motion.     Cervical back: Normal range of motion and neck supple.     Right lower leg: No edema.     Left lower leg: No edema.  Lymphadenopathy:     Cervical: No cervical adenopathy.  Skin:    General: Skin is warm and dry.     Coloration: Skin is not jaundiced or pale.     Findings: No bruising, erythema, lesion or rash.  Neurological:     General: No  focal deficit present.     Mental Status: He is alert and oriented to person, place, and time.     Cranial Nerves: No cranial nerve deficit.     Coordination: Coordination normal.  Psychiatric:        Mood and Affect: Mood normal.        Behavior: Behavior normal.        Thought Content: Thought content normal.        Judgment: Judgment normal.       Assessment & Plan:  1. Routine general medical examination at a health care facility - Needs to lose weight through diet and exercise - Follow up in one year or sooner if needed - CBC with Differential/Platelet - Comprehensive metabolic panel - Lipid panel - TSH  2. Essential hypertension - Continue Benicar. Printed prescription given for year refill  - CBC with Differential/Platelet - Comprehensive metabolic panel - Lipid panel - TSH - olmesartan-hydrochlorothiazide (BENICAR HCT) 20-12.5 MG tablet; Take 1 tablet by mouth daily.  Dispense: 90 tablet; Refill: 3  3. Erectile dysfunction, unspecified erectile dysfunction type - Viagra as needed - CBC with Differential/Platelet - Comprehensive metabolic panel - Lipid panel - TSH  4. Hyperlipidemia, unspecified hyperlipidemia type - Consider statin  - CBC with Differential/Platelet - Comprehensive metabolic panel - Lipid panel - TSH  5. Prostate cancer screening  - PSA  6. Borderline diabetic - Consider metformin  - CBC with Differential/Platelet - Comprehensive metabolic panel - Lipid panel - TSH - Hemoglobin A1c   [name], NP

## 2019-05-23 NOTE — Telephone Encounter (Signed)
Patient is calling back for lab results ?

## 2019-05-23 NOTE — Patient Instructions (Signed)
I will follow up with you regarding your blood work   Please work on weight loss through diet and exercise as your weight is up 13 pounds over the last year.   Follow up in one year or sooner if needed

## 2019-05-23 NOTE — Addendum Note (Signed)
Addended by: Gwenyth Ober R on: 05/23/2019 04:43 PM   Modules accepted: Orders

## 2019-05-23 NOTE — Telephone Encounter (Signed)
This has been taking care of.

## 2019-07-28 DIAGNOSIS — H401131 Primary open-angle glaucoma, bilateral, mild stage: Secondary | ICD-10-CM | POA: Diagnosis not present

## 2019-08-15 ENCOUNTER — Other Ambulatory Visit: Payer: Self-pay | Admitting: Adult Health

## 2019-08-15 ENCOUNTER — Telehealth: Payer: Self-pay | Admitting: Adult Health

## 2019-08-15 DIAGNOSIS — N529 Male erectile dysfunction, unspecified: Secondary | ICD-10-CM

## 2019-08-15 NOTE — Telephone Encounter (Signed)
Error

## 2019-08-15 NOTE — Telephone Encounter (Signed)
Sent to the pharmacy by e-scribe. 

## 2019-08-27 ENCOUNTER — Other Ambulatory Visit: Payer: Self-pay

## 2019-08-28 ENCOUNTER — Encounter: Payer: Self-pay | Admitting: Adult Health

## 2019-08-28 ENCOUNTER — Ambulatory Visit (INDEPENDENT_AMBULATORY_CARE_PROVIDER_SITE_OTHER): Payer: Medicare Other | Admitting: Adult Health

## 2019-08-28 VITALS — BP 102/68 | Temp 98.4°F | Wt 221.0 lb

## 2019-08-28 DIAGNOSIS — E1169 Type 2 diabetes mellitus with other specified complication: Secondary | ICD-10-CM | POA: Diagnosis not present

## 2019-08-28 LAB — POCT GLYCOSYLATED HEMOGLOBIN (HGB A1C): HbA1c, POC (controlled diabetic range): 5.6 % (ref 0.0–7.0)

## 2019-08-28 NOTE — Patient Instructions (Signed)
It was great seeing you today   Your A1c dropped to 5.6   Keep up the good work and I am going to take you off Metformin   Lets follow up in August to recheck

## 2019-08-28 NOTE — Progress Notes (Signed)
Subjective:    Patient ID: David Cannon, male    DOB: Dec 26, 1950, 69 y.o.   MRN: [id number]  HPI 69 year old male who  has a past medical history of ED (erectile dysfunction), Hyperlipidemia, Hypertension, Other abnormal glucose, and Personal history of colonic polyps (03/08/2001).  He presents to the office today for follow-up regarding newly diagnosed diabetes.  For some time he did have glucose intolerance but at his physical in January 2021 his A1c was 6.7.  He was subsequently started on Metformin 500 mg by mouth daily.  Since starting this medication he denies side effects such as nausea, vomiting, or diarrhea.  He was encouraged to work on weight loss through diet and exercise  Today he reports that he has been eating a healthier diet and gives credit to his wife for this.  He has been eating a lot of lean meats such as chicken and fish.  He also joined integrative medicine program in [facility] and reports "they have me on a lot of supplements and shakes".  He has been able to lose 19 pounds over the last 3 months.  He denies any side effects from Metformin.   Wt Readings from Last 3 Encounters:  08/28/19 221 lb (100.2 kg)  05/23/19 240 lb (108.9 kg)  05/16/18 227 lb (103 kg)    Review of Systems See HPI   Past Medical History:  Diagnosis Date  . ED (erectile dysfunction)   . Hyperlipidemia   . Hypertension    resolved  . Other abnormal glucose   . Personal history of colonic polyps 03/08/2001    Social History   Socioeconomic History  . Marital status: Married    Spouse name: Not on file  . Number of children: Not on file  . Years of education: Not on file  . Highest education level: Not on file  Occupational History  . Not on file  Tobacco Use  . Smoking status: Never Smoker  . Smokeless tobacco: Never Used  Substance and Sexual Activity  . Alcohol use: Yes    Alcohol/week: 0.0 standard drinks    Comment: once every 6 months  . Drug use: No   . Sexual activity: Not on file  Other Topics Concern  . Not on file  Social History Narrative   Retired from working at the [employer]    Married    Daughter and step son   Three grand children       He likes to go to [employer] and spending time with his wife.       Social Determinants of Health   Financial Resource Strain:   . Difficulty of Paying Living Expenses:   Food Insecurity:   . Worried About Charity fundraiser in the Last Year:   . Arboriculturist in the Last Year:   Transportation Needs:   . Film/video editor (Medical):   Marland Kitchen Lack of Transportation (Non-Medical):   Physical Activity:   . Days of Exercise per Week:   . Minutes of Exercise per Session:   Stress:   . Feeling of Stress :   Social Connections:   . Frequency of Communication with Friends and Family:   . Frequency of Social Gatherings with Friends and Family:   . Attends Religious Services:   . Active Member of Clubs or Organizations:   . Attends Archivist Meetings:   Marland Kitchen Marital Status:   Intimate Partner Violence:   .  Fear of Current or Ex-Partner:   . Emotionally Abused:   Marland Kitchen Physically Abused:   . Sexually Abused:     Past Surgical History:  Procedure Laterality Date  . COLONOSCOPY W/ POLYPECTOMY      Family History  Problem Relation Age of Onset  . Colon cancer Father        prostate  . Lung cancer Mother        lung  . Cancer Maternal Grandfather        colon  . Colon cancer Maternal Grandfather   . Coronary artery disease Neg Hx     No Known Allergies  Current Outpatient Medications on File Prior to Visit  Medication Sig Dispense Refill  . albuterol (PROVENTIL HFA;VENTOLIN HFA) 108 (90 BASE) MCG/ACT inhaler Inhale 2 puffs into the lungs every 6 (six) hours as needed for wheezing or shortness of breath. 1 Inhaler 0  . aspirin 81 MG EC tablet Take 81 mg by mouth daily.      . metFORMIN (GLUCOPHAGE) 500 MG tablet Take 1 tablet (500 mg total) by mouth daily. 90  tablet 3  . olmesartan-hydrochlorothiazide (BENICAR HCT) 20-12.5 MG tablet Take 1 tablet by mouth daily. 90 tablet 3  . sildenafil (REVATIO) 20 MG tablet TAKE 2 TO 5 TABLETS BY MOUTH AS NEEDED 30 tablet 5   No current facility-administered medications on file prior to visit.    BP 102/68   Temp 98.4 F (36.9 C)   Wt 221 lb (100.2 kg)   BMI 29.77 kg/m       Objective:   Physical Exam Vitals and nursing note reviewed.  Constitutional:      Appearance: Normal appearance.  Musculoskeletal:        General: Normal range of motion.  Skin:    General: Skin is warm and dry.  Neurological:     General: No focal deficit present.     Mental Status: He is alert and oriented to person, place, and time.  Psychiatric:        Mood and Affect: Mood normal.        Behavior: Behavior normal.        Thought Content: Thought content normal.        Judgment: Judgment normal.       Assessment & Plan:  1. Type 2 diabetes mellitus with other specified complication, without long-term current use of insulin (HCC) - POCT A1C- 5.6 -has improved.  With the weight loss and heart healthy diet I am going to bring him off Metformin and have him follow-up in 3 to 4 months for recheck. - Encouraged to continue to eat healthy and exercise  [name], NP

## 2019-09-16 ENCOUNTER — Telehealth: Payer: Self-pay | Admitting: Adult Health

## 2019-09-16 NOTE — Chronic Care Management (AMB) (Signed)
  Chronic Care Management   Note  09/16/2019 Name: David Cannon MRN: [id number] DOB: December 21, 1950  David Cannon is a 68 y.o. year old male who is a primary care patient of [name], NP. I reached out to [name] by phone today in response to a referral sent by [name]'s PCP, [name], NP.   [name] was given information about Chronic Care Management services today including:  1. CCM service includes personalized support from designated clinical staff supervised by his physician, including individualized plan of care and coordination with other care providers 2. 24/7 contact phone numbers for assistance for urgent and routine care needs. 3. Service will only be billed when office clinical staff spend 20 minutes or more in a month to coordinate care. 4. Only one practitioner may furnish and bill the service in a calendar month. 5. The patient may stop CCM services at any time (effective at the end of the month) by phone call to the office staff.   Patient agreed to services and verbal consent obtained.   Follow up plan:   [place]

## 2019-10-27 ENCOUNTER — Other Ambulatory Visit: Payer: Self-pay

## 2019-10-27 ENCOUNTER — Ambulatory Visit: Payer: Medicare Other

## 2019-10-27 DIAGNOSIS — I10 Essential (primary) hypertension: Secondary | ICD-10-CM

## 2019-10-27 DIAGNOSIS — E785 Hyperlipidemia, unspecified: Secondary | ICD-10-CM

## 2019-10-27 DIAGNOSIS — E1169 Type 2 diabetes mellitus with other specified complication: Secondary | ICD-10-CM

## 2019-10-27 NOTE — Chronic Care Management (AMB) (Signed)
Chronic Care Management Pharmacy  Name: David Cannon  MRN: [id number] DOB: 01/19/1951  Initial Questions: 1. Have you seen any other providers since your last visit? NA 2. Any changes in your medicines or health? Yes - stopped metformin   Chief Complaint/ HPI  David Cannon,  69 y.o. , male presents for their Initial CCM visit with the clinical pharmacist In office.  PCP : [name], NP  Their chronic conditions include: DM, HTN, HLD, ED  Office Visits: 08/28/2019- [name], NP- Patient presented for office visit for follow up for newly diagnosed diabetes. A1c improved to 5.6%. Patient to come off metformin and continue eating healthy and exercise.   05/23/2019- Lab note: A1c: 6.7%. Patient started on Metformin 526m daily.   05/23/2019- CEffie Berkshire NP- Patient presented for office visit for yearly exam. BP: 130/80 mmHg. Patient to work on weight loss through diet and exercise and follow up in 1 year. Labs ordered: CBC, CMP, lipid panel, TSH,  A1c, PSA.   Medications: Outpatient Encounter Medications as of 10/27/2019  Medication Sig  . latanoprost (XALATAN) 0.005 % ophthalmic solution Place 1 drop into both eyes at bedtime.  .Marland Kitchenolmesartan-hydrochlorothiazide (BENICAR HCT) 20-12.5 MG tablet Take 1 tablet by mouth daily. (Patient taking differently: Take 1 tablet by mouth every other day. )  . sildenafil (REVATIO) 20 MG tablet TAKE 2 TO 5 TABLETS BY MOUTH AS NEEDED  . albuterol (PROVENTIL HFA;VENTOLIN HFA) 108 (90 BASE) MCG/ACT inhaler Inhale 2 puffs into the lungs every 6 (six) hours as needed for wheezing or shortness of breath. (Patient not taking: Reported on 10/27/2019)  . aspirin 81 MG EC tablet Take 81 mg by mouth daily.   (Patient not taking: Reported on 10/27/2019)  . metFORMIN (GLUCOPHAGE) 500 MG tablet Take 1 tablet (500 mg total) by mouth daily. (Patient not taking: Reported on 10/27/2019)   No facility-administered encounter medications on file as of 10/27/2019.     Current Diagnosis/Assessment:  Goals Addressed            This Visit's Progress   . Pharmacy Care Plan       CARE PLAN ENTRY (see longitudinal plan of care for additional care plan information)  Current Barriers:  . Chronic Disease Management support, education, and care coordination needs related to Hypertension, Hyperlipidemia, and Diabetes   Hypertension BP Readings from Last 3 Encounters:  08/28/19 102/68  05/23/19 130/80  05/16/18 110/64   . Pharmacist Clinical Goal(s): o Over the next 180 days, patient will work with PharmD and providers to maintain BP goal <130/80 . Current regimen:  o olmesartan- hydrochlorothiazide 20-12.525m 1 tablet every other day . Interventions: o We discussed diet and exercise extensively . Patient self care activities - Over the next 180 days, patient will: o Check BP 2-3x per week, document, and provide at future appointments o Ensure daily salt intake < 2300 mg/day  Hyperlipidemia Lab Results  Component Value Date/Time   LDLCALC 101 (H) 05/23/2019 10:15 AM   . Pharmacist Clinical Goal(s): o Over the next 180 days, patient will work with PharmD and providers to maintain LDL goal < 100 . Current regimen:  o Lifestyle modifications  (diet/ exercise)  . Interventions: o We discussed:  diet and exercise extensively and  statin benefits in patients with diabetes  . Patient self care activities - Over the next 180 days, patient will: o  control with diet and exercise.   Diabetes Lab Results  Component Value Date/Time   HGBA1C 5.6 08/28/2019  09:30 AM   HGBA1C 6.7 (H) 05/23/2019 10:15 AM   HGBA1C 6.5 05/16/2018 01:36 PM   . Pharmacist Clinical Goal(s): o Over the next 180 days, patient will work with PharmD and providers to maintain A1c goal <7% . Current regimen:  o Lifestyle modification (diet and exercise)  . Interventions: o We discussed: diet and exercise extensively . Patient self care activities - Over the next 180 days,  patient will: o Continue with lifestyle modifications.   Medication management . Pharmacist Clinical Goal(s): o Over the next 180 days, patient will work with PharmD and providers to maintain optimal medication adherence . Current [facility]: [facility]  . Interventions o Comprehensive medication review performed. o Continue current medication management strategy . Patient self care activities - Over the next 180 days, patient will: o Take medications as prescribed o Report any questions or concerns to PharmD and/or provider(s)  Initial goal documentation       SDOH Interventions     Most Recent Value  SDOH Interventions  Financial Strain Interventions Other (Comment)  [Obtain latanoprost 0.005% eye drops from [place] due to cost.]  Transportation Interventions Intervention Not Indicated      Diabetes  Patient reported he started going to [facility] and was able to lose weight. He reports started: 240 lbs and dropped down to 215lbs. Currently weight 205lbs.  Patient plans to continue working on weight-loss (diet/ exercise) and continue to be off metformin.  He reports he walks about 1 mile 3 to 4x/ week.   Recent Relevant Labs: Lab Results  Component Value Date/Time   HGBA1C 5.6 08/28/2019 09:30 AM   HGBA1C 6.7 (H) 05/23/2019 10:15 AM   HGBA1C 6.5 05/16/2018 01:36 PM   MICROALBUR 0.9 08/21/2013 08:47 AM   MICROALBUR 0.7 12/29/2011 09:00 AM    A1c: 5.9 (10/20/2019) (patient reported from integrative medicine clinic).   Patient was on these meds in past: metformin (d/c due to controlled A1c), pioglitazone (vision impairment), sitagliptin   Patient is currently controlled on the following medications:   Lifestyle modification (diet and exercise)   Last diabetic Eye exam:  Lab Results  Component Value Date/Time   HMDIABEYEEXA No Retinopathy 04/27/2015 12:00 AM  -  Patient reports going to eye doctor every couple of months; reports has appt tomorrow    Last diabetic  Foot exam: No results found for: HMDIABFOOTEX  -  Denies neuropathy/ numbness.   We discussed: diet and exercise extensively  Plan Continue control with diet and exercise  Patient to request updated labwork to be sent to [place] [facility] [place] (looking for updated microalbumin).    Hypertension  Denies dizziness/ lightheadedness  Hx of orthostatic hypotension (but reports this has gone away).   Patient reported he takes olmesartan/ HCTZ every other day and notes his BP tends to be a bit higher (130s) on days he does not take dose.   Office blood pressures are  BP Readings from Last 3 Encounters:  08/28/19 102/68  05/23/19 130/80  05/16/18 110/64   Patient has failed these meds in the past: none   Patient checks BP at home 1-2x per week  Patient home SBP readings are ranging: 120s   Patient is controlled on:   olmesartan- hydrochlorothiazide 20-12.77m, 1 tablet every other day  We discussed diet and exercise extensively  Plan Continue current medications  Recommend for patient to take olmesartan/ HCTZ 20/12.597m 0.5 tablet once daily and request updated prescription.     Hyperlipidemia   LDL goal < 100  Lipid Panel     Component Value Date/Time   CHOL 163 05/23/2019 1015   TRIG 133.0 05/23/2019 1015   HDL 34.90 (L) 05/23/2019 1015   LDLCALC 101 (H) 05/23/2019 1015    Hepatic Function Latest Ref Rng & Units 05/23/2019 05/16/2018 04/05/2017  Total Protein 6.0 - 8.3 g/dL 7.2 7.4 7.0  Albumin 3.5 - 5.2 g/dL 4.3 4.4 4.3  AST 0 - 37 U/L _0 ALT 0 - 53 U/L _1 Alk Phosphatase 39 - 117 U/L 48 45 51  Total Bilirubin 0.2 - 1.2 mg/dL 0.5 0.5 0.6  Bilirubin, Direct 0.0 - 0.3 mg/dL - 0.1 0.1     The 10-year ASCVD risk score Mikey Bussing DC Jr., et al., 2013) is: 24.6%   Values used to calculate the score:     Age: 66 years     Sex: Male     Is Non-Hispanic African American: Yes     Diabetic: Yes     Tobacco smoker: No     Systolic Blood Pressure: 742 mmHg      Is BP treated: Yes     HDL Cholesterol: 34.9 mg/dL     Total Cholesterol: 163 mg/dL   Patient has failed these meds in past: simvastatin (unclear why it was stopped and patient does not remember taking it)   Patient is currently controlled on the following medications:   Lifestyle modifications (diet/ exercise)   We discussed:  diet and exercise extensively and  statin benefits in patients with DM   Plan Continue control with diet and exercise  Patient would like to continue as is and continue to work on lowering lipid levels through diet and exercise; not open to adding medications at this moment.   Will reassess at follow up.   ED   Patient has failed these meds in past: none Patient is currently controlled on the following medications:  . Sildenafil 18m, 2 to 5 tablets as needed   Plan Continue current medications  Shortness of breath   Patient denies diagnosis of asthma/ COPD or other breathing condition.   Reports was prescribed due to occasional SOB. Reports using rescue inhaler rarely. (unable to define time).   Patient is currently controlled on the following medications:  . Albuterol HFA inhaler, inhale 2 puffs every six hours as needed for wheezing or shortness of breath   Plan Continue current medications  Medication Management  Patient organizes medications: patient reports using a pill box and sets it at the table.  [facility]  Adherence: no gaps in refill history (per medication dispense history from 04/30/2019 to 628/2021)   Follow up Follow up visit with PharmD in 6 months.  [place] [facility] [place]    AAnson Crofts PharmD Clinical Pharmacist [facility] [place]([phone]

## 2019-10-27 NOTE — Patient Instructions (Addendum)
Visit Information  Goals Addressed            This Visit's Progress   . Pharmacy Care Plan       CARE PLAN ENTRY (see longitudinal plan of care for additional care plan information)  Current Barriers:  . Chronic Disease Management support, education, and care coordination needs related to Hypertension, Hyperlipidemia, and Diabetes   Hypertension BP Readings from Last 3 Encounters:  08/28/19 102/68  05/23/19 130/80  05/16/18 110/64   . Pharmacist Clinical Goal(s): o Over the next 180 days, patient will work with PharmD and providers to maintain BP goal <130/80 . Current regimen:  o olmesartan- hydrochlorothiazide 20-12.5mg , 1 tablet every other day . Interventions: o We discussed diet and exercise extensively . Patient self care activities - Over the next 180 days, patient will: o Check BP 2-3x per week, document, and provide at future appointments o Ensure daily salt intake < 2300 mg/day  Hyperlipidemia Lab Results  Component Value Date/Time   LDLCALC 101 (H) 05/23/2019 10:15 AM   . Pharmacist Clinical Goal(s): o Over the next 180 days, patient will work with PharmD and providers to maintain LDL goal < 100 . Current regimen:  o Lifestyle modifications  (diet/ exercise)  . Interventions: o We discussed:  diet and exercise extensively and  statin benefits in patients with diabetes  . Patient self care activities - Over the next 180 days, patient will: o  control with diet and exercise.   Diabetes Lab Results  Component Value Date/Time   HGBA1C 5.6 08/28/2019 09:30 AM   HGBA1C 6.7 (H) 05/23/2019 10:15 AM   HGBA1C 6.5 05/16/2018 01:36 PM   . Pharmacist Clinical Goal(s): o Over the next 180 days, patient will work with PharmD and providers to maintain A1c goal <7% . Current regimen:  o Lifestyle modification (diet and exercise)  . Interventions: o We discussed: diet and exercise extensively . Patient self care activities - Over the next 180 days, patient  will: o Continue with lifestyle modifications.   Medication management . Pharmacist Clinical Goal(s): o Over the next 180 days, patient will work with PharmD and providers to maintain optimal medication adherence . Current pharmacy: [facility]  . Interventions o Comprehensive medication review performed. o Continue current medication management strategy . Patient self care activities - Over the next 180 days, patient will: o Take medications as prescribed o Report any questions or concerns to PharmD and/or provider(s)  Initial goal documentation        David Cannon was given information about Chronic Care Management services today including:  1. CCM service includes personalized support from designated clinical staff supervised by his physician, including individualized plan of care and coordination with other care providers 2. 24/7 contact phone numbers for assistance for urgent and routine care needs. 3. Standard insurance, coinsurance, copays and deductibles apply for chronic care management only during months in which we provide at least 20 minutes of these services. Most insurances cover these services at 100%, however patients may be responsible for any copay, coinsurance and/or deductible if applicable. This service may help you avoid the need for more expensive face-to-face services. 4. Only one practitioner may furnish and bill the service in a calendar month. 5. The patient may stop CCM services at any time (effective at the end of the month) by phone call to the office staff.  Patient agreed to services and verbal consent obtained.   The patient verbalized understanding of instructions provided today and agreed to receive  a mailed copy of patient instruction and/or educational materials. Telephone follow up appointment with pharmacy team member scheduled for:  04/15/2020  David Cannon, PharmD Clinical Pharmacist [facility] [place] [phone]   David Cannon stands for "Dietary Approaches to Stop Hypertension." The DASH eating plan is a healthy eating plan that has been shown to reduce high blood pressure (hypertension). It may also reduce your risk for type 2 diabetes, heart disease, and stroke. The DASH eating plan may also help with weight loss. What are tips for following this plan?  General guidelines  Avoid eating more than 2,300 mg (milligrams) of salt (sodium) a day. If you have hypertension, you may need to reduce your sodium intake to 1,500 mg a day.  Limit alcohol intake to no more than 1 drink a day for nonpregnant women and 2 drinks a day for men. One drink equals 12 oz of beer, 5 oz of wine, or 1 oz of hard liquor.  Work with your health care provider to maintain a healthy body weight or to lose weight. Ask what an ideal weight is for you.  Get at least 30 minutes of exercise that causes your heart to beat faster (aerobic exercise) most days of the week. Activities may include walking, swimming, or biking.  Work with your health care provider or diet and nutrition specialist (dietitian) to adjust your eating plan to your individual calorie needs. Reading food labels   Check food labels for the amount of sodium per serving. Choose foods with less than 5 percent of the Daily Value of sodium. Generally, foods with less than 300 mg of sodium per serving fit into this eating plan.  To find whole grains, look for the word "whole" as the first word in the ingredient list. Shopping  Buy products labeled as "low-sodium" or "no salt added."  Buy fresh foods. Avoid canned foods and premade or frozen meals. Cooking  Avoid adding salt when cooking. Use salt-free seasonings or herbs instead of table salt or sea salt. Check with your health care provider or pharmacist before using salt substitutes.  Do not fry foods. Cook foods using healthy methods such as baking, boiling, grilling, and broiling instead.  Cook with  heart-healthy oils, such as olive, canola, soybean, or sunflower oil. Meal planning  Eat a balanced diet that includes: ? 5 or more servings of fruits and vegetables each day. At each meal, try to fill half of your plate with fruits and vegetables. ? Up to 6-8 servings of whole grains each day. ? Less than 6 oz of lean meat, poultry, or fish each day. A 3-oz serving of meat is about the same size as a deck of cards. One egg equals 1 oz. ? 2 servings of low-fat dairy each day. ? A serving of nuts, seeds, or beans 5 times each week. ? Heart-healthy fats. Healthy fats called Omega-3 fatty acids are found in foods such as flaxseeds and coldwater fish, like sardines, salmon, and mackerel.  Limit how much you eat of the following: ? Canned or prepackaged foods. ? Food that is high in trans fat, such as fried foods. ? Food that is high in saturated fat, such as fatty meat. ? Sweets, desserts, sugary drinks, and other foods with added sugar. ? Full-fat dairy products.  Do not salt foods before eating.  Try to eat at least 2 vegetarian meals each week.  Eat more home-cooked food and less restaurant, buffet, and fast food.  When eating at a restaurant, ask that your food be prepared with less salt or no salt, if possible. What foods are recommended? The items listed may not be a complete list. Talk with your dietitian about what dietary choices are best for you. Grains Whole-grain or whole-wheat bread. Whole-grain or whole-wheat pasta. Brown rice. [place]. Bulgur. Whole-grain and low-sodium cereals. Pita bread. Low-fat, low-sodium crackers. Whole-wheat flour tortillas. Vegetables Fresh or frozen vegetables (raw, steamed, roasted, or grilled). Low-sodium or reduced-sodium tomato and vegetable juice. Low-sodium or reduced-sodium tomato sauce and tomato paste. Low-sodium or reduced-sodium canned vegetables. Fruits All fresh, dried, or frozen fruit. Canned fruit in natural juice (without  added sugar). Meat and other protein foods Skinless chicken or [place]. Ground chicken or [place]. Pork with fat trimmed off. Fish and seafood. Egg whites. Dried beans, peas, or lentils. Unsalted nuts, nut butters, and seeds. Unsalted canned beans. Lean cuts of beef with fat trimmed off. Low-sodium, lean deli meat. Dairy Low-fat (1%) or fat-free (skim) milk. Fat-free, low-fat, or reduced-fat cheeses. Nonfat, low-sodium ricotta or cottage cheese. Low-fat or nonfat yogurt. Low-fat, low-sodium cheese. Fats and oils Soft margarine without trans fats. Vegetable oil. Low-fat, reduced-fat, or light mayonnaise and salad dressings (reduced-sodium). Canola, safflower, olive, soybean, and sunflower oils. Avocado. Seasoning and other foods Herbs. Spices. Seasoning mixes without salt. Unsalted popcorn and pretzels. Fat-free sweets. What foods are not recommended? The items listed may not be a complete list. Talk with your dietitian about what dietary choices are best for you. Grains Baked goods made with fat, such as croissants, muffins, or some breads. Dry pasta or rice meal packs. Vegetables Creamed or fried vegetables. Vegetables in a cheese sauce. Regular canned vegetables (not low-sodium or reduced-sodium). Regular canned tomato sauce and paste (not low-sodium or reduced-sodium). Regular tomato and vegetable juice (not low-sodium or reduced-sodium). Angie Fava. Olives. Fruits Canned fruit in a light or heavy syrup. Fried fruit. Fruit in cream or butter sauce. Meat and other protein foods Fatty cuts of meat. Ribs. Fried meat. Berniece Salines. Sausage. Bologna and other processed lunch meats. Salami. Fatback. Hotdogs. Bratwurst. Salted nuts and seeds. Canned beans with added salt. Canned or smoked fish. Whole eggs or egg yolks. Chicken or [place] with skin. Dairy Whole or 2% milk, cream, and half-and-half. Whole or full-fat cream cheese. Whole-fat or sweetened yogurt. Full-fat cheese. Nondairy creamers. Whipped toppings.  Processed cheese and cheese spreads. Fats and oils Butter. Stick margarine. Lard. Shortening. Ghee. Bacon fat. Tropical oils, such as coconut, palm kernel, or palm oil. Seasoning and other foods Salted popcorn and pretzels. Onion salt, garlic salt, seasoned salt, table salt, and sea salt. Worcestershire sauce. Tartar sauce. Barbecue sauce. Teriyaki sauce. Soy sauce, including reduced-sodium. Steak sauce. Canned and packaged gravies. Fish sauce. Oyster sauce. Cocktail sauce. Horseradish that you find on the shelf. Ketchup. Mustard. Meat flavorings and tenderizers. Bouillon cubes. Hot sauce and Tabasco sauce. Premade or packaged marinades. Premade or packaged taco seasonings. Relishes. Regular salad dressings. Where to find more information:  National Heart, Lung, and [place]: https://wilson-eaton.com/  American Heart Association: www.heart.org Summary  The DASH eating plan is a healthy eating plan that has been shown to reduce high blood pressure (hypertension). It may also reduce your risk for type 2 diabetes, heart disease, and stroke.  With the DASH eating plan, you should limit salt (sodium) intake to 2,300 mg a day. If you have hypertension, you may need to reduce your sodium intake to 1,500 mg a day.  When on the DASH eating plan,  aim to eat more fresh fruits and vegetables, whole grains, lean proteins, low-fat dairy, and heart-healthy fats.  Work with your health care provider or diet and nutrition specialist (dietitian) to adjust your eating plan to your individual calorie needs. This information is not intended to replace advice given to you by your health care provider. Make sure you discuss any questions you have with your health care provider. Document Revised: 03/30/2017 Document Reviewed: 04/10/2016 Elsevier Patient Education  2020 [employer].

## 2019-10-28 ENCOUNTER — Other Ambulatory Visit: Payer: Self-pay | Admitting: Adult Health

## 2019-10-28 DIAGNOSIS — I10 Essential (primary) hypertension: Secondary | ICD-10-CM

## 2019-10-28 DIAGNOSIS — E119 Type 2 diabetes mellitus without complications: Secondary | ICD-10-CM | POA: Diagnosis not present

## 2019-10-28 DIAGNOSIS — E785 Hyperlipidemia, unspecified: Secondary | ICD-10-CM

## 2019-10-28 LAB — HM DIABETES EYE EXAM

## 2019-11-19 ENCOUNTER — Encounter: Payer: Self-pay | Admitting: Family Medicine

## 2019-12-09 ENCOUNTER — Ambulatory Visit: Payer: Medicare Other | Admitting: Adult Health

## 2020-01-07 ENCOUNTER — Ambulatory Visit (INDEPENDENT_AMBULATORY_CARE_PROVIDER_SITE_OTHER): Payer: Medicare Other | Admitting: Adult Health

## 2020-01-07 ENCOUNTER — Other Ambulatory Visit: Payer: Self-pay

## 2020-01-07 ENCOUNTER — Encounter: Payer: Self-pay | Admitting: Adult Health

## 2020-01-07 VITALS — BP 122/80 | HR 57 | Temp 98.5°F | Wt 209.2 lb

## 2020-01-07 DIAGNOSIS — N529 Male erectile dysfunction, unspecified: Secondary | ICD-10-CM

## 2020-01-07 DIAGNOSIS — E1169 Type 2 diabetes mellitus with other specified complication: Secondary | ICD-10-CM

## 2020-01-07 LAB — POCT GLYCOSYLATED HEMOGLOBIN (HGB A1C): Hemoglobin A1C: 5.5 % (ref 4.0–5.6)

## 2020-01-07 MED ORDER — SILDENAFIL CITRATE 20 MG PO TABS: ORAL_TABLET | ORAL | 1 refills | Status: DC

## 2020-01-07 NOTE — Progress Notes (Signed)
Subjective:    Patient ID: David Cannon, male    DOB: Jan 16, 1951, 69 y.o.   MRN: [id number]  HPI 69 year old male who  has a past medical history of ED (erectile dysfunction), Hyperlipidemia, Hypertension, Other abnormal glucose, and Personal history of colonic polyps (03/08/2001).  He presents to the office today for follow-up regarding diabetes mellitus.  During his last visit and April 2021 his A1c had dropped from 6.7-5.6.  At home he was eating healthier and staying more active.  He had also joined integrative medicine group and was taking a lot of supplements and getting a lot of meal replacement shakes.  He was able to lose significant weight with lifestyle modifications Wt Readings from Last 3 Encounters:  01/07/20 209 lb 3.2 oz (94.9 kg)  08/28/19 221 lb (100.2 kg)  05/23/19 240 lb (108.9 kg)    Lab Results  Component Value Date   HGBA1C 5.6 08/28/2019    Review of Systems See HPI   Past Medical History:  Diagnosis Date  . ED (erectile dysfunction)   . Hyperlipidemia   . Hypertension    resolved  . Other abnormal glucose   . Personal history of colonic polyps 03/08/2001    Social History   Socioeconomic History  . Marital status: Married    Spouse name: Not on file  . Number of children: Not on file  . Years of education: Not on file  . Highest education level: Not on file  Occupational History  . Not on file  Tobacco Use  . Smoking status: Never Smoker  . Smokeless tobacco: Never Used  Substance and Sexual Activity  . Alcohol use: Yes    Alcohol/week: 0.0 standard drinks    Comment: once every 6 months  . Drug use: No  . Sexual activity: Not on file  Other Topics Concern  . Not on file  Social History Narrative   Retired from working at the [employer]    Married    Daughter and step son   Three grand children       He likes to go to [employer] and spending time with his wife.       Social Determinants of Health   Financial Resource Strain:  Medium Risk  . Difficulty of Paying Living Expenses: Somewhat hard  Food Insecurity:   . Worried About Charity fundraiser in the Last Year: Not on file  . Ran Out of Food in the Last Year: Not on file  Transportation Needs: No Transportation Needs  . Lack of Transportation (Medical): No  . Lack of Transportation (Non-Medical): No  Physical Activity:   . Days of Exercise per Week: Not on file  . Minutes of Exercise per Session: Not on file  Stress:   . Feeling of Stress : Not on file  Social Connections:   . Frequency of Communication with Friends and Family: Not on file  . Frequency of Social Gatherings with Friends and Family: Not on file  . Attends Religious Services: Not on file  . Active Member of Clubs or Organizations: Not on file  . Attends Archivist Meetings: Not on file  . Marital Status: Not on file  Intimate Partner Violence:   . Fear of Current or Ex-Partner: Not on file  . Emotionally Abused: Not on file  . Physically Abused: Not on file  . Sexually Abused: Not on file    Past Surgical History:  Procedure Laterality Date  . COLONOSCOPY  W/ POLYPECTOMY      Family History  Problem Relation Age of Onset  . Colon cancer Father        prostate  . Lung cancer Mother        lung  . Cancer Maternal Grandfather        colon  . Colon cancer Maternal Grandfather   . Coronary artery disease Neg Hx     No Known Allergies  Current Outpatient Medications on File Prior to Visit  Medication Sig Dispense Refill  . latanoprost (XALATAN) 0.005 % ophthalmic solution Place 1 drop into both eyes at bedtime.    Marland Kitchen olmesartan-hydrochlorothiazide (BENICAR HCT) 20-12.5 MG tablet Take 1 tablet by mouth daily. (Patient taking differently: Take 1 tablet by mouth every other day. ) 90 tablet 3  . sildenafil (REVATIO) 20 MG tablet TAKE 2 TO 5 TABLETS BY MOUTH AS NEEDED 30 tablet 5  . albuterol (PROVENTIL HFA;VENTOLIN HFA) 108 (90 BASE) MCG/ACT inhaler Inhale 2 puffs into  the lungs every 6 (six) hours as needed for wheezing or shortness of breath. (Patient not taking: Reported on 10/27/2019) 1 Inhaler 0  . aspirin 81 MG EC tablet Take 81 mg by mouth daily.   (Patient not taking: Reported on 10/27/2019)    . metFORMIN (GLUCOPHAGE) 500 MG tablet Take 1 tablet (500 mg total) by mouth daily. (Patient not taking: Reported on 10/27/2019) 90 tablet 3   No current facility-administered medications on file prior to visit.    BP 122/80 (BP Location: Left Arm, Patient Position: Sitting, Cuff Size: Normal)   Pulse (!) 57   Temp 98.5 F (36.9 C) (Oral)   Wt 209 lb 3.2 oz (94.9 kg)   SpO2 97%   BMI 28.18 kg/m       Objective:   Physical Exam Vitals and nursing note reviewed.  Constitutional:      Appearance: Normal appearance.  Cardiovascular:     Rate and Rhythm: Normal rate and regular rhythm.     Pulses: Normal pulses.     Heart sounds: Normal heart sounds.  Pulmonary:     Effort: Pulmonary effort is normal.     Breath sounds: Normal breath sounds.  Musculoskeletal:        General: Normal range of motion.  Skin:    General: Skin is warm and dry.     Capillary Refill: Capillary refill takes less than 2 seconds.  Neurological:     General: No focal deficit present.     Mental Status: He is oriented to person, place, and time.  Psychiatric:        Mood and Affect: Mood normal.        Behavior: Behavior normal.        Thought Content: Thought content normal.        Judgment: Judgment normal.       Assessment & Plan:  1. Type 2 diabetes mellitus with other specified complication, without long-term current use of insulin (HCC)  - POC HgB A1c- 5.5  - No need for medication at this time   2. Erectile dysfunction, unspecified erectile dysfunction type  - sildenafil (REVATIO) 20 MG tablet; TAKE 2 TO 5 TABLETS BY MOUTH AS NEEDED  Dispense: 90 tablet; Refill: 1   [name], NP

## 2020-01-12 ENCOUNTER — Other Ambulatory Visit: Payer: Self-pay

## 2020-01-12 ENCOUNTER — Ambulatory Visit (INDEPENDENT_AMBULATORY_CARE_PROVIDER_SITE_OTHER): Payer: Medicare Other

## 2020-01-12 DIAGNOSIS — Z0000 Encounter for general adult medical examination without abnormal findings: Secondary | ICD-10-CM

## 2020-01-12 NOTE — Patient Instructions (Signed)
[name] , Thank you for taking time to come for your Medicare Wellness Visit. I appreciate your ongoing commitment to your health goals. Please review the following plan we discussed and let me know if I can assist you in the future.   Screening recommendations/referrals: Colonoscopy: Up to date, next due 06/27/2020 Recommended yearly ophthalmology/optometry visit for glaucoma screening and checkup Recommended yearly dental visit for hygiene and checkup  Vaccinations: Influenza vaccine: Currently due for fall 2021 flu vaccine, you may at our office or at your pharmacy. Pneumococcal vaccine: Completed series Tdap vaccine: Currently due, please contact your insurance company to discuss cost. Shingles vaccine: Currently due, please contact your pharmacy to discuss cost and to receive vaccines.    Advanced directives: Advance directive discussed with you today. Even though you declined this today please call our office should you change your mind and we can give you the proper paperwork for you to fill out.   Conditions/risks identified: None   Next appointment: 04/15/2020 @ 9:00 am with Pharmacist via telephone   Preventive Care 65 Years and Older, Male Preventive care refers to lifestyle choices and visits with your health care provider that can promote health and wellness. What does preventive care include?  A yearly physical exam. This is also called an annual well check.  Dental exams once or twice a year.  Routine eye exams. Ask your health care provider how often you should have your eyes checked.  Personal lifestyle choices, including:  Daily care of your teeth and gums.  Regular physical activity.  Eating a healthy diet.  Avoiding tobacco and drug use.  Limiting alcohol use.  Practicing safe sex.  Taking low doses of aspirin every day.  Taking vitamin and mineral supplements as recommended by your health care provider. What happens during an annual well  check? The services and screenings done by your health care provider during your annual well check will depend on your age, overall health, lifestyle risk factors, and family history of disease. Counseling  Your health care provider may ask you questions about your:  Alcohol use.  Tobacco use.  Drug use.  Emotional well-being.  Home and relationship well-being.  Sexual activity.  Eating habits.  History of falls.  Memory and ability to understand (cognition).  Work and work Statistician. Screening  You may have the following tests or measurements:  Height, weight, and BMI.  Blood pressure.  Lipid and cholesterol levels. These may be checked every 5 years, or more frequently if you are over 60 years old.  Skin check.  Lung cancer screening. You may have this screening every year starting at age 37 if you have a 30-pack-year history of smoking and currently smoke or have quit within the past 15 years.  Fecal occult blood test (FOBT) of the stool. You may have this test every year starting at age 58.  Flexible sigmoidoscopy or colonoscopy. You may have a sigmoidoscopy every 5 years or a colonoscopy every 10 years starting at age 77.  Prostate cancer screening. Recommendations will vary depending on your family history and other risks.  Hepatitis C blood test.  Hepatitis B blood test.  Sexually transmitted disease (STD) testing.  Diabetes screening. This is done by checking your blood sugar (glucose) after you have not eaten for a while (fasting). You may have this done every 1-3 years.  Abdominal aortic aneurysm (AAA) screening. You may need this if you are a current or former smoker.  Osteoporosis. You may be screened starting  at age 25 if you are at high risk. Talk with your health care provider about your test results, treatment options, and if necessary, the need for more tests. Vaccines  Your health care provider may recommend certain vaccines, such  as:  Influenza vaccine. This is recommended every year.  Tetanus, diphtheria, and acellular pertussis (Tdap, Td) vaccine. You may need a Td booster every 10 years.  Zoster vaccine. You may need this after age 29.  Pneumococcal 13-valent conjugate (PCV13) vaccine. One dose is recommended after age 54.  Pneumococcal polysaccharide (PPSV23) vaccine. One dose is recommended after age 58. Talk to your health care provider about which screenings and vaccines you need and how often you need them. This information is not intended to replace advice given to you by your health care provider. Make sure you discuss any questions you have with your health care provider. Document Released: 05/14/2015 Document Revised: 01/05/2016 Document Reviewed: 02/16/2015 Elsevier Interactive Patient Education  2017 [place] Prevention in the Home Falls can cause injuries. They can happen to people of all ages. There are many things you can do to make your home safe and to help prevent falls. What can I do on the outside of my home?  Regularly fix the edges of walkways and driveways and fix any cracks.  Remove anything that might make you trip as you walk through a door, such as a raised step or threshold.  Trim any bushes or trees on the path to your home.  Use bright outdoor lighting.  Clear any walking paths of anything that might make someone trip, such as rocks or tools.  Regularly check to see if handrails are loose or broken. Make sure that both sides of any steps have handrails.  Any raised decks and porches should have guardrails on the edges.  Have any leaves, snow, or ice cleared regularly.  Use sand or salt on walking paths during winter.  Clean up any spills in your garage right away. This includes oil or grease spills. What can I do in the bathroom?  Use night lights.  Install grab bars by the toilet and in the tub and shower. Do not use towel bars as grab bars.  Use  non-skid mats or decals in the tub or shower.  If you need to sit down in the shower, use a plastic, non-slip stool.  Keep the floor dry. Clean up any water that spills on the floor as soon as it happens.  Remove soap buildup in the tub or shower regularly.  Attach bath mats securely with double-sided non-slip rug tape.  Do not have throw rugs and other things on the floor that can make you trip. What can I do in the bedroom?  Use night lights.  Make sure that you have a light by your bed that is easy to reach.  Do not use any sheets or blankets that are too big for your bed. They should not hang down onto the floor.  Have a firm chair that has side arms. You can use this for support while you get dressed.  Do not have throw rugs and other things on the floor that can make you trip. What can I do in the kitchen?  Clean up any spills right away.  Avoid walking on wet floors.  Keep items that you use a lot in easy-to-reach places.  If you need to reach something above you, use a strong step stool that has a grab bar.  Keep electrical cords out of the way.  Do not use floor polish or wax that makes floors slippery. If you must use wax, use non-skid floor wax.  Do not have throw rugs and other things on the floor that can make you trip. What can I do with my stairs?  Do not leave any items on the stairs.  Make sure that there are handrails on both sides of the stairs and use them. Fix handrails that are broken or loose. Make sure that handrails are as long as the stairways.  Check any carpeting to make sure that it is firmly attached to the stairs. Fix any carpet that is loose or worn.  Avoid having throw rugs at the top or bottom of the stairs. If you do have throw rugs, attach them to the floor with carpet tape.  Make sure that you have a light switch at the top of the stairs and the bottom of the stairs. If you do not have them, ask someone to add them for you. What  else can I do to help prevent falls?  Wear shoes that:  Do not have high heels.  Have rubber bottoms.  Are comfortable and fit you well.  Are closed at the toe. Do not wear sandals.  If you use a stepladder:  Make sure that it is fully opened. Do not climb a closed stepladder.  Make sure that both sides of the stepladder are locked into place.  Ask someone to hold it for you, if possible.  Clearly mark and make sure that you can see:  Any grab bars or handrails.  First and last steps.  Where the edge of each step is.  Use tools that help you move around (mobility aids) if they are needed. These include:  Canes.  Walkers.  Scooters.  Crutches.  Turn on the lights when you go into a dark area. Replace any light bulbs as soon as they burn out.  Set up your furniture so you have a clear path. Avoid moving your furniture around.  If any of your floors are uneven, fix them.  If there are any pets around you, be aware of where they are.  Review your medicines with your doctor. Some medicines can make you feel dizzy. This can increase your chance of falling. Ask your doctor what other things that you can do to help prevent falls. This information is not intended to replace advice given to you by your health care provider. Make sure you discuss any questions you have with your health care provider. Document Released: 02/11/2009 Document Revised: 09/23/2015 Document Reviewed: 05/22/2014 Elsevier Interactive Patient Education  2017 [employer].

## 2020-01-12 NOTE — Progress Notes (Signed)
Subjective:   David Cannon is a 69 y.o. male who presents for Medicare Annual/Subsequent preventive examination.  I connected with David Cannon  today by telephone and verified that I am speaking with the correct person using two identifiers. Location patient: home Location provider: work Persons participating in the virtual visit: patient, provider.   I discussed the limitations, risks, security and privacy concerns of performing an evaluation and management service by telephone and the availability of in person appointments. I also discussed with the patient that there may be a patient responsible charge related to this service. The patient expressed understanding and verbally consented to this telephonic visit.    Interactive audio and video telecommunications were attempted between this provider and patient, however failed, due to patient having technical difficulties OR patient did not have access to video capability.  We continued and completed visit with audio only.      Review of Systems    N/A Cardiac Risk Factors include: advanced age (>65men, >47 women);diabetes mellitus;dyslipidemia;hypertension;male gender     Objective:    Today's Vitals   There is no height or weight on file to calculate BMI.  Advanced Directives 01/12/2020 06/28/2015 06/14/2015  Does Patient Have a Medical Advance Directive? No No No  Would patient like information on creating a medical advance directive? No - Patient declined - No - patient declined information    Current Medications (verified) Outpatient Encounter Medications as of 01/12/2020  Medication Sig  . latanoprost (XALATAN) 0.005 % ophthalmic solution Place 1 drop into both eyes at bedtime.  Marland Kitchen olmesartan-hydrochlorothiazide (BENICAR HCT) 20-12.5 MG tablet Take 1 tablet by mouth daily. (Patient taking differently: Take 1 tablet by mouth every other day. )  . sildenafil (REVATIO) 20 MG tablet TAKE 2 TO 5 TABLETS BY MOUTH AS NEEDED    No facility-administered encounter medications on file as of 01/12/2020.    Allergies (verified) Patient has no known allergies.   History: Past Medical History:  Diagnosis Date  . ED (erectile dysfunction)   . Hyperlipidemia   . Hypertension    resolved  . Other abnormal glucose   . Personal history of colonic polyps 03/08/2001   Past Surgical History:  Procedure Laterality Date  . COLONOSCOPY W/ POLYPECTOMY     Family History  Problem Relation Age of Onset  . Colon cancer Father        prostate  . Lung cancer Mother        lung  . Cancer Maternal Grandfather        colon  . Colon cancer Maternal Grandfather   . Coronary artery disease Neg Hx    Social History   Socioeconomic History  . Marital status: Married    Spouse name: Not on file  . Number of children: Not on file  . Years of education: Not on file  . Highest education level: Not on file  Occupational History  . Not on file  Tobacco Use  . Smoking status: Never Smoker  . Smokeless tobacco: Never Used  Substance and Sexual Activity  . Alcohol use: Yes    Alcohol/week: 0.0 standard drinks    Comment: once every 6 months  . Drug use: No  . Sexual activity: Not on file  Other Topics Concern  . Not on file  Social History Narrative   Retired from working at the [employer]    Married    Daughter and step son   Three grand children  He likes to go to the beach and spending time with his wife.       Social Determinants of Health   Financial Resource Strain: Low Risk   . Difficulty of Paying Living Expenses: Not hard at all  Food Insecurity: No Food Insecurity  . Worried About Charity fundraiser in the Last Year: Never true  . Ran Out of Food in the Last Year: Never true  Transportation Needs: No Transportation Needs  . Lack of Transportation (Medical): No  . Lack of Transportation (Non-Medical): No  Physical Activity: Inactive  . Days of Exercise per Week: 0 days  . Minutes of  Exercise per Session: 0 min  Stress: No Stress Concern Present  . Feeling of Stress : Not at all  Social Connections: Moderately Integrated  . Frequency of Communication with Friends and Family: Three times a week  . Frequency of Social Gatherings with Friends and Family: Three times a week  . Attends Religious Services: More than 4 times per year  . Active Member of Clubs or Organizations: No  . Attends Archivist Meetings: Never  . Marital Status: Married    Tobacco Counseling Counseling given: Not Answered   Clinical Intake:  Pre-visit preparation completed: Yes  Pain : No/denies pain     Nutritional Risks: None Diabetes: No CBG done?: No Did pt. bring in CBG monitor from home?: No  How often do you need to have someone help you when you read instructions, pamphlets, or other written materials from your doctor or pharmacy?: 1 - Never What is the last grade level you completed in school?: Bachelors Degree  Diabetic?No  Interpreter Needed?: No  Information entered by :: [place] of Daily Living In your present state of health, do you have any difficulty performing the following activities: 01/12/2020  Hearing? N  Vision? N  Difficulty concentrating or making decisions? N  Walking or climbing stairs? N  Dressing or bathing? N  Doing errands, shopping? N  Preparing Food and eating ? N  Using the Toilet? N  In the past six months, have you accidently leaked urine? N  Do you have problems with loss of bowel control? N  Managing your Medications? N  Managing your Finances? N  Housekeeping or managing your Housekeeping? N  Some recent data might be hidden    Patient Care Team: [name], NP as PCP - General (Family Medicine) Earnie Larsson, [facility] as Pharmacist (Pharmacist)  Indicate any recent Medical Services you may have received from other than [facility] providers in the past year (date may be approximate).     Assessment:    This is a routine wellness examination for David Cannon.  Hearing/Vision screen  Hearing Screening   125Hz  250Hz  500Hz  1000Hz  2000Hz  3000Hz  4000Hz  6000Hz  8000Hz   Right ear:           Left ear:           Vision Screening Comments: Patient states gets eyes checked every 3-4 months   Dietary issues and exercise activities discussed: Current Exercise Habits: The patient does not participate in regular exercise at present  Goals    . Exercise 150 min/wk Moderate Activity    . Patient Stated     I would like to start weight training     . Pharmacy Care Plan     CARE PLAN ENTRY (see longitudinal plan of care for additional care plan information)  Current Barriers:  . Chronic Disease Management support, education,  and care coordination needs related to Hypertension, Hyperlipidemia, and Diabetes   Hypertension BP Readings from Last 3 Encounters:  08/28/19 102/68  05/23/19 130/80  05/16/18 110/64   . Pharmacist Clinical Goal(s): o Over the next 180 days, patient will work with PharmD and providers to maintain BP goal <130/80 . Current regimen:  o olmesartan- hydrochlorothiazide 20-12.5mg , 1 tablet every other day . Interventions: o We discussed diet and exercise extensively . Patient self care activities - Over the next 180 days, patient will: o Check BP 2-3x per week, document, and provide at future appointments o Ensure daily salt intake < 2300 mg/day  Hyperlipidemia Lab Results  Component Value Date/Time   LDLCALC 101 (H) 05/23/2019 10:15 AM   . Pharmacist Clinical Goal(s): o Over the next 180 days, patient will work with PharmD and providers to maintain LDL goal < 100 . Current regimen:  o Lifestyle modifications  (diet/ exercise)  . Interventions: o We discussed:  diet and exercise extensively and  statin benefits in patients with diabetes  . Patient self care activities - Over the next 180 days, patient will: o  control with diet and exercise.   Diabetes Lab Results   Component Value Date/Time   HGBA1C 5.6 08/28/2019 09:30 AM   HGBA1C 6.7 (H) 05/23/2019 10:15 AM   HGBA1C 6.5 05/16/2018 01:36 PM   . Pharmacist Clinical Goal(s): o Over the next 180 days, patient will work with PharmD and providers to maintain A1c goal <7% . Current regimen:  o Lifestyle modification (diet and exercise)  . Interventions: o We discussed: diet and exercise extensively . Patient self care activities - Over the next 180 days, patient will: o Continue with lifestyle modifications.   Medication management . Pharmacist Clinical Goal(s): o Over the next 180 days, patient will work with PharmD and providers to maintain optimal medication adherence . Current pharmacy: [facility]  . Interventions o Comprehensive medication review performed. o Continue current medication management strategy . Patient self care activities - Over the next 180 days, patient will: o Take medications as prescribed o Report any questions or concerns to PharmD and/or provider(s)  Initial goal documentation       Depression Screen PHQ 2/9 Scores 01/12/2020 01/07/2020 05/16/2018 03/01/2016 04/30/2015 11/22/2014 09/26/2014  PHQ - 2 Score 0 0 0 0 0 0 0  PHQ- 9 Score 0 - - - - - -    Fall Risk Fall Risk  01/12/2020 01/07/2020 05/16/2018 03/19/2018 03/01/2016  Falls in the past year? 0 0 0 0 No  Comment - - - Emmi Telephone Survey: data to providers prior to load -  Number falls in past yr: 0 0 - - -  Injury with Fall? 0 0 - - -  Risk for fall due to : No Fall Risks - - - -  Follow up Falls evaluation completed;Falls prevention discussed Falls evaluation completed - - -    Any stairs in or around the home? No  If so, are there any without handrails? No  Home free of loose throw rugs in walkways, pet beds, electrical cords, etc? Yes  Adequate lighting in your home to reduce risk of falls? Yes   ASSISTIVE DEVICES UTILIZED TO PREVENT FALLS:  Life alert? No  Use of a cane, walker or w/c? No  Grab bars in the  bathroom? No  Shower chair or bench in shower? No  Elevated toilet seat or a handicapped toilet? No    Cognitive Function:     6CIT Screen  01/12/2020  What Year? 0 points  What month? 0 points  What time? 0 points  Count back from 20 0 points  Months in reverse 0 points  Repeat phrase 4 points  Total Score 4    Immunizations Immunization History  Administered Date(s) Administered  . Influenza Split 03/02/2011  . Influenza Whole 03/18/2007, 05/07/2009  . Influenza, High Dose Seasonal PF 01/17/2018  . Influenza,inj,Quad PF,6+ Mos 01/12/2016, 12/31/2018  . Influenza-Unspecified 02/22/2014  . PFIZER SARS-COV-2 Vaccination 05/20/2019, 06/10/2019  . Pneumococcal Conjugate-13 01/12/2016  . Pneumococcal Polysaccharide-23 01/13/2013  . Td 12/19/2007  . Zoster 03/16/2016    TDAP status: Due, Education has been provided regarding the importance of this vaccine. Advised may receive this vaccine at local pharmacy or Health Dept. Aware to provide a copy of the vaccination record if obtained from local pharmacy or Health Dept. Verbalized acceptance and understanding. Flu Vaccine status: Up to date Pneumococcal vaccine status: Up to date Covid-19 vaccine status: Completed vaccines  Qualifies for Shingles Vaccine? Yes   Zostavax completed Yes   Shingrix Completed?: No.    Education has been provided regarding the importance of this vaccine. Patient has been advised to call insurance company to determine out of pocket expense if they have not yet received this vaccine. Advised may also receive vaccine at local pharmacy or Health Dept. Verbalized acceptance and understanding.  Screening Tests Health Maintenance  Topic Date Due  . TETANUS/TDAP  12/18/2017  . PNA vac Low Risk Adult (2 of 2 - PPSV23) 01/13/2018  . INFLUENZA VACCINE  11/30/2019  . COLONOSCOPY  06/27/2020  . COVID-19 Vaccine  Completed  . Hepatitis C Screening  Completed  . FOOT EXAM  Discontinued  . HEMOGLOBIN A1C   Discontinued  . OPHTHALMOLOGY EXAM  Discontinued    Health Maintenance  Health Maintenance Due  Topic Date Due  . TETANUS/TDAP  12/18/2017  . PNA vac Low Risk Adult (2 of 2 - PPSV23) 01/13/2018  . INFLUENZA VACCINE  11/30/2019    Colorectal cancer screening: Completed 06/28/2015. Repeat every 5 years  Lung Cancer Screening: (Low Dose CT Chest recommended if Age 77-80 years, 30 pack-year currently smoking OR have quit w/in 15years.) does not qualify.   Lung Cancer Screening Referral: N/A  Additional Screening:  Hepatitis C Screening: does qualify; Completed 03/16/2016  Vision Screening: Recommended annual ophthalmology exams for early detection of glaucoma and other disorders of the eye. Is the patient up to date with their annual eye exam?  Yes  Who is the provider or what is the name of the office in which the patient attends annual eye exams? Dr. Heather [place]  If pt is not established with a provider, would they like to be referred to a provider to establish care? No .   Dental Screening: Recommended annual dental exams for proper oral hygiene  Community Resource Referral / Chronic Care Management: CRR required this visit?  No   CCM required this visit?  No      Plan:     I have personally reviewed and noted the following in the patient's chart:   . Medical and social history . Use of alcohol, tobacco or illicit drugs  . Current medications and supplements . Functional ability and status . Nutritional status . Physical activity . Advanced directives . List of other physicians . Hospitalizations, surgeries, and ER visits in previous 12 months . Vitals . Screenings to include cognitive, depression, and falls . Referrals and appointments  In addition, I have reviewed  and discussed with patient certain preventive protocols, quality metrics, and best practice recommendations. A written personalized care plan for preventive services as well as general preventive  health recommendations were provided to patient.     [name], LPN   [id number]   Nurse Notes: None

## 2020-02-02 DIAGNOSIS — H401131 Primary open-angle glaucoma, bilateral, mild stage: Secondary | ICD-10-CM | POA: Diagnosis not present

## 2020-04-14 ENCOUNTER — Telehealth: Payer: Self-pay | Admitting: Pharmacist

## 2020-04-14 NOTE — Chronic Care Management (AMB) (Signed)
I left the patient a message about his upcoming appointment on 04-15-2020 @ 9:00 am with the clinical pharmacist. He was asked to please have all medication on hand to review the pharmacist.   Neita Goodnight) Mare Ferrari, [place] Assistant [phone]

## 2020-04-15 ENCOUNTER — Ambulatory Visit: Payer: Medicare Other | Admitting: Pharmacist

## 2020-04-15 DIAGNOSIS — E1169 Type 2 diabetes mellitus with other specified complication: Secondary | ICD-10-CM

## 2020-04-15 DIAGNOSIS — I10 Essential (primary) hypertension: Secondary | ICD-10-CM

## 2020-04-15 NOTE — Chronic Care Management (AMB) (Signed)
Chronic Care Management Pharmacy  Name: David Cannon  MRN: [id number] DOB: 1950-07-08  Initial Questions: 1. Have you seen any other providers since your last visit? NA 2. Any changes in your medicines or health? Yes - stopped metformin   Chief Complaint/ HPI  David Cannon,  69 y.o. , male presents for their Follow-Up CCM visit with the clinical pharmacist via telephone due to COVID-19 Pandemic.  PCP : [name], NP  Their chronic conditions include: DM, HTN, HLD, ED  Office Visits: 01/12/20 [name], LPN: Patient presented for medicare annual wellness exam.  01/07/20 [name], NP: Patient presented for DM follow up. A1c improved to 5.5%. No medication changes.  08/28/2019- [name], NP- Patient presented for office visit for follow up for newly diagnosed diabetes. A1c improved to 5.6%. Patient to come off metformin and continue eating healthy and exercise.   05/23/2019- Lab note: A1c: 6.7%. Patient started on Metformin 585m daily.   05/23/2019- CEffie Berkshire NP- Patient presented for office visit for yearly exam. BP: 130/80 mmHg. Patient to work on weight loss through diet and exercise and follow up in 1 year. Labs ordered: CBC, CMP, lipid panel, TSH,  A1c, PSA.   Consult Visits: [name](optometry): Patient presented for eye exam. Unable to access notes.  Medications: Outpatient Encounter Medications as of 04/15/2020  Medication Sig  . latanoprost (XALATAN) 0.005 % ophthalmic solution Place 1 drop into both eyes at bedtime.  .Marland Kitchenolmesartan-hydrochlorothiazide (BENICAR HCT) 20-12.5 MG tablet Take 1 tablet by mouth daily. (Patient taking differently: Take 1 tablet by mouth every other day. )  . sildenafil (REVATIO) 20 MG tablet TAKE 2 TO 5 TABLETS BY MOUTH AS NEEDED   No facility-administered encounter medications on file as of 04/15/2020.   [place]  Current Diagnosis/Assessment:  Goals Addressed            This  Visit's Progress   . Pharmacy Care Plan       CARE PLAN ENTRY (see longitudinal plan of care for additional care plan information)  Current Barriers:  . Chronic Disease Management support, education, and care coordination needs related to Hypertension, Hyperlipidemia, and Diabetes   Hypertension BP Readings from Last 3 Encounters:  01/07/20 122/80  08/28/19 102/68  05/23/19 130/80   . Pharmacist Clinical Goal(s): o Over the next 14 days, patient will work with PharmD and providers to achieve BP goal <130/80 . Current regimen:  o olmesartan- hydrochlorothiazide 20-12.563m 1 tablet every other day . Interventions: o We discussed diet and exercise extensively . Patient self care activities - Over the next 14 days, patient will: o Check blood pressure daily, document, and send in mychart message  o Ensure daily salt intake < 2300 mg/day o Split olmesartan-HCTZ in half and take daily  Hyperlipidemia Lab Results  Component Value Date/Time   LDLCALC 101 (H) 05/23/2019 10:15 AM   . Pharmacist Clinical Goal(s): o Over the next 90 days, patient will work with PharmD and providers to achieve LDL goal < 100 . Current regimen:  o Lifestyle modifications  (diet/ exercise)  . Interventions: o We discussed:  diet and exercise extensively and  statin benefits in patients with diabetes  . Patient self care activities - Over the next 90 days, patient will: o  control with diet and exercise.   Diabetes Lab Results  Component Value Date/Time   HGBA1C 5.5 01/07/2020 08:37 AM   HGBA1C 5.6 08/28/2019 09:30 AM   HGBA1C 6.7 (  H) 05/23/2019 10:15 AM   HGBA1C 6.5 05/16/2018 01:36 PM   . Pharmacist Clinical Goal(s): o Over the next 90 days, patient will work with PharmD and providers to maintain A1c goal <7% . Current regimen:  o Lifestyle modification (diet and exercise)  . Interventions: o We discussed: diet and exercise extensively . Patient self care activities - Over the next 180 days,  patient will: o Continue with lifestyle modifications.   Medication management . Pharmacist Clinical Goal(s): o Over the next 90 days, patient will work with PharmD and providers to maintain optimal medication adherence . [facility] . Interventions o Comprehensive medication review performed. o Continue current medication management strategy . Patient self care activities - Over the next 90 days, patient will: o Take medications as prescribed o Report any questions or concerns to PharmD and/or provider(s)  Please see past updates related to this goal by clicking on the "Past Updates" button in the selected goal          Diabetes  Patient reported he stopped going to the [facility] and was able to lose weight.   Patient plans to continue working on weight-loss (diet/ exercise) and continue to be off metformin.  Ideal weight goal was 210 lbs (went down to 202 lbs) 205-210  He reports he walks about 1 mile 3 to 4x/ week.   Recent Relevant Labs: Lab Results  Component Value Date/Time   HGBA1C 5.5 01/07/2020 08:37 AM   HGBA1C 5.6 08/28/2019 09:30 AM   HGBA1C 6.7 (H) 05/23/2019 10:15 AM   HGBA1C 6.5 05/16/2018 01:36 PM   MICROALBUR 0.9 08/21/2013 08:47 AM   MICROALBUR 0.7 12/29/2011 09:00 AM    A1c: 5.9 (10/20/2019) (patient reported from integrative medicine clinic).  Patient checks BGs: 3 times a week FBG: 90s (typically), highest 109  Patient was on these meds in past: metformin (d/c due to controlled A1c), pioglitazone (vision impairment), sitagliptin   Patient is currently controlled on the following medications:   Lifestyle modification (diet and exercise)   Last diabetic Eye exam:  Lab Results  Component Value Date/Time   HMDIABEYEEXA No Retinopathy 10/28/2019 12:00 AM  -  Patient reports going to eye doctor every couple of months  Last diabetic Foot exam: No results found for: HMDIABFOOTEX  -  Denies  neuropathy/ numbness.   We discussed: diet and exercise extensively  -Patient received DM testing supplies from the [place] -Patient is still exercising consistently and maintaining weight  Plan Continue control with diet and exercise   Hypertension  Denies dizziness/ lightheadedness  Office blood pressures are  BP Readings from Last 3 Encounters:  01/07/20 122/80  08/28/19 102/68  05/23/19 130/80   Patient has failed these meds in the past: none   Patient checks BP at home 1-2x per week  Patient home SBP readings are ranging: could not provide previous readings; BP today before medicine was 149/83 HR 74  Patient is controlled on:   olmesartan-hydrochlorothiazide 20-12.50m, 1 tablet every other day  We discussed diet and exercise extensively  -Benefits of taking blood pressure medication consistently every day vs every other day  Plan Continue current medications  Recommended for patient to take olmesartan/HCTZ 20/12.516m 0.5 tablet once daily. Patient will check BP once daily after taking medication and provide readings in 2 weeks.   Hyperlipidemia   LDL goal < 100  Lipid Panel     Component Value Date/Time   CHOL 163 05/23/2019 1015   TRIG  133.0 05/23/2019 1015   HDL 34.90 (L) 05/23/2019 1015   LDLCALC 101 (H) 05/23/2019 1015    Hepatic Function Latest Ref Rng & Units 05/23/2019 05/16/2018 04/05/2017  Total Protein 6.0 - 8.3 g/dL 7.2 7.4 7.0  Albumin 3.5 - 5.2 g/dL 4.3 4.4 4.3  AST 0 - 37 U/L '21 24 22  ' ALT 0 - 53 U/L '23 26 25  ' Alk Phosphatase 39 - 117 U/L 48 45 51  Total Bilirubin 0.2 - 1.2 mg/dL 0.5 0.5 0.6  Bilirubin, Direct 0.0 - 0.3 mg/dL - 0.1 0.1     The ASCVD Risk score ([place]., et al., 2013) failed to calculate for the following reasons:   The systolic blood pressure is missing   Patient has failed these meds in past: simvastatin (unclear why it was stopped and patient does not remember taking it)   Patient is currently controlled on the following  medications:   Lifestyle modifications (diet/ exercise)   We discussed:  diet and exercise extensively and  statin benefits in patients with DM   Plan Continue control with diet and exercise  Will reassess at follow up.   ED   Patient has failed these meds in past: none Patient is currently controlled on the following medications:  . Sildenafil 72m, 2 to 5 tablets as needed   Plan Continue current medications  Shortness of breath   Patient denies diagnosis of asthma/ COPD or other breathing condition.   Reports was prescribed due to occasional SOB. Reports using rescue inhaler rarely. (unable to define time).   Patient is currently controlled on the following medications:  . Albuterol HFA inhaler, inhale 2 puffs every six hours as needed for wheezing or shortness of breath   Plan Continue current medications   Vaccines   Reviewed and discussed patient's vaccination history.    Immunization History  Administered Date(s) Administered  . Influenza Split 03/02/2011  . Influenza Whole 03/18/2007, 05/07/2009  . Influenza, High Dose Seasonal PF 01/17/2018  . Influenza,inj,Quad PF,6+ Mos 01/12/2016, 12/31/2018  . Influenza-Unspecified 02/22/2014  . PFIZER SARS-COV-2 Vaccination 05/20/2019, 06/10/2019  . Pneumococcal Conjugate-13 01/12/2016  . Pneumococcal Polysaccharide-23 01/13/2013  . Td 12/19/2007  . Zoster 03/16/2016    Plan  Recommended patient receive Pneumovax and tetanus vaccine in office/at pharmacy.   Medication Management   Patient's preferred pharmacy is:  [facility] [id number] [place] [place]Phone: [phone]: [address]Phone: [phone]: [phone] Uses pill box? Yes Pt endorses 100% compliance  We discussed: Current pharmacy is preferred with  insurance plan and patient is satisfied with pharmacy services  Plan  Continue current medication management strategy    Follow up: 3 month phone visit 2 week BP assessment  MJeni Salles PharmD [place]Pharmacist [phone]

## 2020-04-15 NOTE — Patient Instructions (Signed)
Hi Karlos,  It was lovely to get to speak with you over the phone! Below is a more detailed version of our plan. Go ahead and split your Benicar in half and take daily as well as record your blood pressure daily for 2 weeks. Please send me a message in 2 weeks so we can figure out the best plan for your blood pressure medication.  Best, Maddie  [name], PharmD [facility] Clinical Pharmacist [place] [facility] [place]   Visit Information  Goals Addressed            This [place] (see longitudinal plan of care for additional care plan information)  Current Barriers:   Chronic Disease Management support, education, and care coordination needs related to Hypertension, Hyperlipidemia, and Diabetes   Hypertension BP Readings from Last 3 Encounters:  01/07/20 122/80  08/28/19 102/68  05/23/19 130/80    Pharmacist Clinical Goal(s): o Over the next 14 days, patient will work with PharmD and providers to achieve BP goal <130/80  Current regimen:  o olmesartan- hydrochlorothiazide 20-12.5mg , 1 tablet every other day  Interventions: o We discussed diet and exercise extensively  Patient self care activities - Over the next 14 days, patient will: o Check blood pressure daily, document, and send in mychart message  o Ensure daily salt intake < 2300 mg/day o Split olmesartan-HCTZ in half and take daily  Hyperlipidemia Lab Results  Component Value Date/Time   LDLCALC 101 (H) 05/23/2019 10:15 AM    Pharmacist Clinical Goal(s): o Over the next 90 days, patient will work with PharmD and providers to achieve LDL goal < 100  Current regimen:  o Lifestyle modifications  (diet/ exercise)   Interventions: o We discussed:  diet and exercise extensively and  statin benefits in patients with diabetes   Patient self care activities - Over the next 90 days, patient will: o  control with diet and exercise.    Diabetes Lab Results  Component Value Date/Time   HGBA1C 5.5 01/07/2020 08:37 AM   HGBA1C 5.6 08/28/2019 09:30 AM   HGBA1C 6.7 (H) 05/23/2019 10:15 AM   HGBA1C 6.5 05/16/2018 01:36 PM    Pharmacist Clinical Goal(s): o Over the next 90 days, patient will work with PharmD and providers to maintain A1c goal <7%  Current regimen:  o Lifestyle modification (diet and exercise)   Interventions: o We discussed: diet and exercise extensively  Patient self care activities - Over the next 180 days, patient will: o Continue with lifestyle modifications.   Medication management  Pharmacist Clinical Goal(s): o Over the next 90 days, patient will work with PharmD and providers to maintain optimal medication adherence  [facility]  Interventions o Comprehensive medication review performed. o Continue current medication management strategy  Patient self care activities - Over the next 90 days, patient will: o Take medications as prescribed o Report any questions or concerns to PharmD and/or provider(s)  Please see past updates related to this goal by clicking on the "Past Updates" button in the selected goal         The patient verbalized understanding of instructions, educational materials, and care plan provided today and declined offer to receive copy of patient instructions, educational materials, and care plan.   The pharmacy team will reach out to the patient again over the next 14 days.   Viona Gilmore, [facility]

## 2020-05-13 ENCOUNTER — Other Ambulatory Visit: Payer: Self-pay | Admitting: Adult Health

## 2020-05-13 DIAGNOSIS — I10 Essential (primary) hypertension: Secondary | ICD-10-CM

## 2020-05-13 MED ORDER — OLMESARTAN MEDOXOMIL-HCTZ 20-12.5 MG PO TABS: 1.0000 | ORAL_TABLET | Freq: Every day | ORAL | 0 refills | Status: DC

## 2020-05-26 DIAGNOSIS — H401131 Primary open-angle glaucoma, bilateral, mild stage: Secondary | ICD-10-CM | POA: Diagnosis not present

## 2020-06-02 ENCOUNTER — Other Ambulatory Visit: Payer: Medicare Other

## 2020-06-02 ENCOUNTER — Other Ambulatory Visit: Payer: Self-pay

## 2020-06-02 DIAGNOSIS — Z20822 Contact with and (suspected) exposure to covid-19: Secondary | ICD-10-CM | POA: Diagnosis not present

## 2020-06-04 ENCOUNTER — Telehealth (INDEPENDENT_AMBULATORY_CARE_PROVIDER_SITE_OTHER): Payer: Medicare Other | Admitting: Adult Health

## 2020-06-04 ENCOUNTER — Encounter: Payer: Self-pay | Admitting: Adult Health

## 2020-06-04 VITALS — BP 166/92 | HR 77 | Temp 98.2°F | Wt 213.0 lb

## 2020-06-04 DIAGNOSIS — U071 COVID-19: Secondary | ICD-10-CM

## 2020-06-04 LAB — SARS-COV-2, NAA 2 DAY TAT

## 2020-06-04 LAB — NOVEL CORONAVIRUS, NAA

## 2020-06-04 MED ORDER — BENZONATATE 200 MG PO CAPS: 200.0000 mg | ORAL_CAPSULE | Freq: Two times a day (BID) | ORAL | 1 refills | Status: DC | PRN

## 2020-06-04 NOTE — Progress Notes (Signed)
Virtual Visit via Video Note  I connected with David Cannon on 06/04/20 at 11:00 AM EST by a video enabled telemedicine application and verified that I am speaking with the correct person using two identifiers.  Location patient: home Location provider:work or home office Persons participating in the virtual visit: patient, provider  I discussed the limitations of evaluation and management by telemedicine and the availability of in person appointments. The patient expressed understanding and agreed to proceed.   HPI: 70 year old male who  has a past medical history of ED (erectile dysfunction), Hyperlipidemia, Hypertension, Other abnormal glucose, and Personal history of colonic polyps (03/08/2001).  He is being evaluated today for an acute issue.  His symptoms started 5 days ago and have progressively started to resolve.  Symptoms started with a sore throat that is improving and nearly resolved.  The only other symptom he has is a dry cough.  He denies fevers, chills, fatigue, shortness of breath, or wheezing.  He did have a positive home Covid test but his PCR test was inconclusive.   ROS: See pertinent positives and negatives per HPI.  Past Medical History:  Diagnosis Date  . ED (erectile dysfunction)   . Hyperlipidemia   . Hypertension    resolved  . Other abnormal glucose   . Personal history of colonic polyps 03/08/2001    Past Surgical History:  Procedure Laterality Date  . COLONOSCOPY W/ POLYPECTOMY      Family History  Problem Relation Age of Onset  . Colon cancer Father        prostate  . Lung cancer Mother        lung  . Cancer Maternal Grandfather        colon  . Colon cancer Maternal Grandfather   . Coronary artery disease Neg Hx        Current Outpatient Medications:  .  latanoprost (XALATAN) 0.005 % ophthalmic solution, Place 1 drop into both eyes at bedtime., Disp: , Rfl:  .  olmesartan-hydrochlorothiazide (BENICAR HCT) 20-12.5 MG tablet, Take 1  tablet by mouth daily., Disp: 90 tablet, Rfl: 0 .  sildenafil (REVATIO) 20 MG tablet, TAKE 2 TO 5 TABLETS BY MOUTH AS NEEDED (Patient not taking: Reported on 06/04/2020), Disp: 90 tablet, Rfl: 1  EXAM:  VITALS per patient if applicable:  GENERAL: alert, oriented, appears well and in no acute distress  HEENT: atraumatic, conjunttiva clear, no obvious abnormalities on inspection of external nose and ears  NECK: normal movements of the head and neck  LUNGS: on inspection no signs of respiratory distress, breathing rate appears normal, no obvious gross SOB, gasping or wheezing  CV: no obvious cyanosis  MS: moves all visible extremities without noticeable abnormality  PSYCH/NEURO: pleasant and cooperative, no obvious depression or anxiety, speech and thought processing grossly intact  ASSESSMENT AND PLAN:  Discussed the following assessment and plan:  1. COVID-19 virus infection  - benzonatate (TESSALON) 200 MG capsule; Take 1 capsule (200 mg total) by mouth 2 (two) times daily as needed for cough.  Dispense: 20 capsule; Refill: 1 -Thankfully symptoms have improved significantly over the last week.  Advised to stay quarantined for 1 week total.     I discussed the assessment and treatment plan with the patient. The patient was provided an opportunity to ask questions and all were answered. The patient agreed with the plan and demonstrated an understanding of the instructions.   The patient was advised to call back or seek an in-person evaluation if  the symptoms worsen or if the condition fails to improve as anticipated.   [name], NP

## 2020-07-30 ENCOUNTER — Telehealth: Payer: Self-pay | Admitting: Pharmacist

## 2020-07-30 NOTE — Chronic Care Management (AMB) (Signed)
Chronic Care Management Pharmacy Assistant   Name: David Cannon  MRN: [id number] DOB: 21-Nov-1950  Reason for Encounter: Disease State-Hypertension Adherence Call   Conditions to be addressed/monitored: HTN  Recent office visits:  02.04.2022 [name], NP Family Medicine  [place] Benzonatate 200 mg twice daily PRN Recent consult visits:  None  Hospital visits:  None in previous 6 months  Medications: Outpatient Encounter Medications as of 07/30/2020  Medication Sig  . benzonatate (TESSALON) 200 MG capsule Take 1 capsule (200 mg total) by mouth 2 (two) times daily as needed for cough.  . latanoprost (XALATAN) 0.005 % ophthalmic solution Place 1 drop into both eyes at bedtime.  Marland Kitchen olmesartan-hydrochlorothiazide (BENICAR HCT) 20-12.5 MG tablet Take 1 tablet by mouth daily.  . sildenafil (REVATIO) 20 MG tablet TAKE 2 TO 5 TABLETS BY MOUTH AS NEEDED (Patient not taking: Reported on 06/04/2020)   No facility-administered encounter medications on file as of 07/30/2020.   Reviewed chart prior to disease state call. Spoke with patient regarding BP  Recent Office Vitals: BP Readings from Last 3 Encounters:  06/04/20 (!) 166/92  01/07/20 122/80  08/28/19 102/68   Pulse Readings from Last 3 Encounters:  06/04/20 77  01/07/20 (!) 57  05/23/19 77    Wt Readings from Last 3 Encounters:  06/04/20 213 lb (96.6 kg)  01/07/20 209 lb 3.2 oz (94.9 kg)  08/28/19 221 lb (100.2 kg)     Kidney Function Lab Results  Component Value Date/Time   CREATININE 1.46 05/23/2019 10:15 AM   CREATININE 1.51 (H) 05/16/2018 01:36 PM   CREATININE 1.54 (H) 12/11/2014 04:20 PM   GFR 57.66 (L) 05/23/2019 10:15 AM   GFRNONAA 69.07 12/10/2009 09:42 AM   GFRAA 67 12/26/2007 09:37 AM    BMP Latest Ref Rng & Units 05/23/2019 05/16/2018 04/05/2017  Glucose 70 - 99 mg/dL 108(H) 81 99  BUN 6 - 23 mg/dL 20 17 17   Creatinine 0.40 - 1.50 mg/dL 1.46 1.51(H) 1.44  Sodium 135 - 145 mEq/L 140 141 140  Potassium  3.5 - 5.1 mEq/L 4.1 4.3 4.1  Chloride 96 - 112 mEq/L 103 105 105  CO2 19 - 32 mEq/L 27 30 28   Calcium 8.4 - 10.5 mg/dL 9.3 9.7 9.1   . Current antihypertensive regimen:  ? olmesartan- hydrochlorothiazide 20-12.5 mg, 1 tablet every other day . How often are you checking your Blood Pressure? 3-5x per week . Current home BP readings: o 03.27 127/76 o 03.28 118/78 o 03.29 136/87 o 03.30 128/82 . What recent interventions/DTPs have been made by any provider to improve Blood Pressure control since last CPP Visit: None . Any recent hospitalizations or ED visits since last visit with CPP? No . What diet changes have been made to improve Blood Pressure Control?  o Decreased sugar intake . What exercise is being done to improve your Blood Pressure Control?  o Takes over 5,000 steps daily  Adherence Review: Is the patient currently on ACE/ARB medication? Yes Does the patient have >5 day gap between last estimated fill dates? No   I spoke with the patient and discussed medication that adherence. There are no current issues with his medication. He states that he continues to decrease his sugar intake. Currently, he is walking about 5000 steps daily and he states he is increasing slowly. There are no current issues with this medication. He denies any side effects. He denies any ED visits since his last CPP or PCP follow-up. There are no issues with this  current pharmacy.  Star Rating Drugs:  Dispensed Quantity Pharmacy  Olmesartan-HCTZ 20-12.5 mg 12.30.2021 [address] [place], [place] [phone]

## 2020-08-04 ENCOUNTER — Telehealth: Payer: Self-pay | Admitting: Pharmacist

## 2020-08-04 NOTE — Chronic Care Management (AMB) (Signed)
    Chronic Care Management Pharmacy Assistant   Name: David Cannon  MRN: [id number] DOB: 1951/04/07  I spoke with the patient and rescheduled his appointment from August 05, 2020, to May 16th at 12:00 PM. He understood.   Medications: Outpatient Encounter Medications as of 08/04/2020  Medication Sig  . benzonatate (TESSALON) 200 MG capsule Take 1 capsule (200 mg total) by mouth 2 (two) times daily as needed for cough.  . latanoprost (XALATAN) 0.005 % ophthalmic solution Place 1 drop into both eyes at bedtime.  Marland Kitchen olmesartan-hydrochlorothiazide (BENICAR HCT) 20-12.5 MG tablet Take 1 tablet by mouth daily.  . sildenafil (REVATIO) 20 MG tablet TAKE 2 TO 5 TABLETS BY MOUTH AS NEEDED (Patient not taking: Reported on 06/04/2020)   No facility-administered encounter medications on file as of 08/04/2020.    Maia Breslow, [place] [phone]

## 2020-08-05 ENCOUNTER — Telehealth: Payer: Medicare Other

## 2020-08-31 DIAGNOSIS — H401131 Primary open-angle glaucoma, bilateral, mild stage: Secondary | ICD-10-CM | POA: Diagnosis not present

## 2020-09-10 ENCOUNTER — Telehealth: Payer: Self-pay | Admitting: Pharmacist

## 2020-09-10 NOTE — Chronic Care Management (AMB) (Signed)
I spoke with the patient about his upcoming appointment on 09/13/2020 @ 12:00 pm with the clinical pharmacist. He was asked to please have all medication on hand to review with the pharmacist. He confirmed the appointment.   Maia Breslow, [place] [phone]

## 2020-09-13 ENCOUNTER — Ambulatory Visit (INDEPENDENT_AMBULATORY_CARE_PROVIDER_SITE_OTHER): Payer: Medicare Other | Admitting: Pharmacist

## 2020-09-13 DIAGNOSIS — I10 Essential (primary) hypertension: Secondary | ICD-10-CM | POA: Diagnosis not present

## 2020-09-13 DIAGNOSIS — E1169 Type 2 diabetes mellitus with other specified complication: Secondary | ICD-10-CM | POA: Diagnosis not present

## 2020-09-13 NOTE — Progress Notes (Signed)
Chronic Care Management Pharmacy Note  09/28/2020 Name:  David Cannon MRN:  [id number] DOB:  1950-10-08  Subjective: David Cannon is an 70 y.o. year old male who is a primary patient of Nafziger, Tommi Rumps, NP.  The CCM team was consulted for assistance with disease management and care coordination needs.    Engaged with patient by telephone for follow up visit in response to provider referral for pharmacy case management and/or care coordination services.   Consent to Services:  The patient was given information about Chronic Care Management services, agreed to services, and gave verbal consent prior to initiation of services.  Please see initial visit note for detailed documentation.   Patient Care Team: [name], NP as PCP - General (Family Medicine) Viona Gilmore, [facility] as Pharmacist (Pharmacist)  Recent office visits: 06/04/20 [name], NP: Patient presented for video visit for possible COVID infection.   Recent consult visits: 05/26/20 [place] (optometry): Unable to access notes.  Hospital visits: None in previous 6 months  Objective:  Lab Results  Component Value Date   CREATININE 1.46 05/23/2019   BUN 20 05/23/2019   GFR 57.66 (L) 05/23/2019   GFRNONAA 69.07 12/10/2009   GFRAA 67 12/26/2007   NA 140 05/23/2019   K 4.1 05/23/2019   CALCIUM 9.3 05/23/2019   CO2 27 05/23/2019   GLUCOSE 108 (H) 05/23/2019    Lab Results  Component Value Date/Time   HGBA1C 5.5 01/07/2020 08:37 AM   HGBA1C 5.6 08/28/2019 09:30 AM   HGBA1C 6.7 (H) 05/23/2019 10:15 AM   HGBA1C 6.5 05/16/2018 01:36 PM   GFR 57.66 (L) 05/23/2019 10:15 AM   GFR 55.63 (L) 05/16/2018 01:36 PM   MICROALBUR 0.9 08/21/2013 08:47 AM   MICROALBUR 0.7 12/29/2011 09:00 AM    Last diabetic Eye exam:  Lab Results  Component Value Date/Time   HMDIABEYEEXA No Retinopathy 10/28/2019 12:00 AM    Last diabetic Foot exam: No results found for: HMDIABFOOTEX   Lab Results  Component Value Date    CHOL 163 05/23/2019   HDL 34.90 (L) 05/23/2019   LDLCALC 101 (H) 05/23/2019   TRIG 133.0 05/23/2019   CHOLHDL 5 05/23/2019    Hepatic Function Latest Ref Rng & Units 05/23/2019 05/16/2018 04/05/2017  Total Protein 6.0 - 8.3 g/dL 7.2 7.4 7.0  Albumin 3.5 - 5.2 g/dL 4.3 4.4 4.3  AST 0 - 37 U/L '21 24 22  ' ALT 0 - 53 U/L '23 26 25  ' Alk Phosphatase 39 - 117 U/L 48 45 51  Total Bilirubin 0.2 - 1.2 mg/dL 0.5 0.5 0.6  Bilirubin, Direct 0.0 - 0.3 mg/dL - 0.1 0.1    Lab Results  Component Value Date/Time   TSH 1.08 05/23/2019 10:15 AM   TSH 1.54 05/16/2018 01:36 PM    CBC Latest Ref Rng & Units 05/23/2019 05/16/2018 04/05/2017  WBC 4.0 - 10.5 K/uL 5.7 5.7 5.6  Hemoglobin 13.0 - 17.0 g/dL 13.5 13.4 13.0  Hematocrit 39.0 - 52.0 % 42.7 41.2 41.2  Platelets 150.0 - 400.0 K/uL 199.0 224.0 230.0    No results found for: VD25OH  Clinical ASCVD: No  The 10-year ASCVD risk score Mikey Bussing DC Jr., et al., 2013) is: 71.2%   Values used to calculate the score:     Age: 79 years     Sex: Male     Is Non-Hispanic African American: Yes     Diabetic: Yes     Tobacco smoker: Yes     Systolic Blood Pressure:  166 mmHg     Is BP treated: Yes     HDL Cholesterol: 34.9 mg/dL     Total Cholesterol: 163 mg/dL    Depression screen [facility] 2/9 01/12/2020 01/07/2020 05/16/2018  Decreased Interest 0 0 0  Down, Depressed, Hopeless 0 0 0  PHQ - 2 Score 0 0 0  Altered sleeping 0 - -  Tired, decreased energy 0 - -  Change in appetite 0 - -  Feeling bad or failure about yourself  0 - -  Trouble concentrating 0 - -  Moving slowly or fidgety/restless 0 - -  Suicidal thoughts 0 - -  PHQ-9 Score 0 - -  Difficult doing work/chores Not difficult at all - -     Social History   Tobacco Use  Smoking Status Never Smoker  Smokeless Tobacco Never Used   BP Readings from Last 3 Encounters:  06/04/20 (!) 166/92  01/07/20 122/80  08/28/19 102/68   Pulse Readings from Last 3 Encounters:  06/04/20 77  01/07/20 (!) 57   05/23/19 77   Wt Readings from Last 3 Encounters:  06/04/20 213 lb (96.6 kg)  01/07/20 209 lb 3.2 oz (94.9 kg)  08/28/19 221 lb (100.2 kg)   BMI Readings from Last 3 Encounters:  06/04/20 28.69 kg/m  01/07/20 28.18 kg/m  08/28/19 29.77 kg/m    Assessment/Interventions: Review of patient past medical history, allergies, medications, health status, including review of consultants reports, laboratory and other test data, was performed as part of comprehensive evaluation and provision of chronic care management services.   SDOH:  (Social Determinants of Health) assessments and interventions performed: No  SDOH Screenings   Alcohol Screen: Low Risk   . Last Alcohol Screening Score (AUDIT): 1  Depression (PHQ2-9): Low Risk   . PHQ-2 Score: 0  Financial Resource Strain: Low Risk   . Difficulty of Paying Living Expenses: Not hard at all  Food Insecurity: No Food Insecurity  . Worried About Charity fundraiser in the Last Year: Never true  . Ran Out of Food in the Last Year: Never true  Housing: Low Risk   . Last Housing Risk Score: 0  Physical Activity: Inactive  . Days of Exercise per Week: 0 days  . Minutes of Exercise per Session: 0 min  Social Connections: Moderately Integrated  . Frequency of Communication with Friends and Family: Three times a week  . Frequency of Social Gatherings with Friends and Family: Three times a week  . Attends Religious Services: More than 4 times per year  . Active Member of Clubs or Organizations: No  . Attends Archivist Meetings: Never  . Marital Status: Married  Stress: No Stress Concern Present  . Feeling of Stress : Not at all  Tobacco Use: Low Risk   . Smoking Tobacco Use: Never Smoker  . Smokeless Tobacco Use: Never Used  Transportation Needs: No Transportation Needs  . Lack of Transportation (Medical): No  . Lack of Transportation (Non-Medical): No    CCM Care Plan  No Known Allergies  Medications Reviewed Today     Reviewed by Hulda Humphrey, CMA (Certified Medical Assistant) on 06/04/20 at 92  Med List Status: <None>  Medication Order Taking? Sig Documenting Provider Last Dose Status Informant  latanoprost (XALATAN) 0.005 % ophthalmic solution [id number] Yes Place 1 drop into both eyes at bedtime. [provider] Taking Active Self  olmesartan-hydrochlorothiazide (BENICAR HCT) 20-12.5 MG tablet [id number] Yes Take 1 tablet by mouth daily. Nafziger, [place],  NP Taking Active   sildenafil (REVATIO) 20 MG tablet [id number] No TAKE 2 TO 5 TABLETS BY MOUTH AS NEEDED  Patient not taking: Reported on 06/04/2020   [name], NP Not Taking Active           Patient Active Problem List   Diagnosis Date Noted  . Chronic pain of right knee 08/06/2017  . Preventative health care 08/27/2013  . Erectile dysfunction 03/03/2011  . Accessory skin tags 11/27/2010  . Hyperlipidemia 06/18/2009  . Abnormal glucose 12/19/2007  . Essential hypertension 12/19/2007  . Personal history of colonic polyps 03/08/2001    Immunization History  Administered Date(s) Administered  . Influenza Split 03/02/2011  . Influenza Whole 03/18/2007, 05/07/2009  . Influenza, High Dose Seasonal PF 01/17/2018, 01/30/2020  . Influenza,inj,Quad PF,6+ Mos 01/12/2016, 12/31/2018  . Influenza-Unspecified 02/22/2014  . PFIZER(Purple Top)SARS-COV-2 Vaccination 05/20/2019, 06/10/2019  . Pneumococcal Conjugate-13 01/12/2016  . Pneumococcal Polysaccharide-23 01/13/2013, 07/15/2020  . Td 12/19/2007  . Zoster, Live 03/16/2016    Conditions to be addressed/monitored:  Hypertension, Hyperlipidemia, Diabetes and ED  Care Plan : CCM Pharmacy Care Plan  Updates made by Viona Gilmore, [place] since 09/28/2020 12:00 AM    Problem: Problem: Hypertension, Hyperlipidemia, Diabetes and ED     Long-Range Goal: Patient-Specific Goal   Start Date: 09/13/2020  Expected End Date: 09/13/2021  This Visit's Progress: On track  Priority: High   Note:   Current Barriers:  . Unable to independently monitor therapeutic efficacy  Pharmacist Clinical Goal(s):  Marland Kitchen Patient will achieve adherence to monitoring guidelines and medication adherence to achieve therapeutic efficacy through collaboration with PharmD and provider.   Interventions: . 1:1 collaboration with [name], NP regarding development and update of comprehensive plan of care as evidenced by provider attestation and co-signature . Inter-disciplinary care team collaboration (see longitudinal plan of care) . Comprehensive medication review performed; medication list updated in electronic medical record  Hypertension (BP goal <140/90) -Not ideally controlled -Current treatment: . olmesartan-hydrochlorothiazide 20-12.30m, 1 tablet daily  -Medications previously tried: none  -Current home readings: 123/86 HR 72, 120s/70s (checking a couple times a week) -Current dietary habits: did not discuss -Current exercise habits: did not discuss -Denies hypotensive/hypertensive symptoms -Educated on Exercise goal of 150 minutes per week; Importance of home blood pressure monitoring; Proper BP monitoring technique; -Counseled to monitor BP at home weekly, document, and provide log at future appointments -Counseled on diet and exercise extensively Recommended to continue current medication  Hyperlipidemia: (LDL goal < 100) -Uncontrolled -Current treatment: . No medications -Medications previously tried: simvastatin (unclear why it was stopped and patient does not remember taking it)  -Current dietary patterns: did not discuss -Current exercise habits: did not discuss -Educated on Cholesterol goals;  Benefits of statin for ASCVD risk reduction; Importance of limiting foods high in cholesterol; Exercise goal of 150 minutes per week; -Counseled on diet and exercise extensively Recommended statin therapy based on ASCVD risk  Diabetes (A1c goal <7%) -Controlled -Current  medications: . No medications -Medications previously tried: metformin (d/c due to controlled A1c), pioglitazone (vision impairment), sitagliptin   -Current home glucose readings . fasting glucose: does not check . post prandial glucose: does not check -Denies hypoglycemic/hyperglycemic symptoms -Current meal patterns:  . breakfast: did not discuss  . lunch: did not discuss  . dinner: did not discuss . snacks: did not discuss . drinks: did not discuss -Current exercise: did not discuss -Educated on A1c and blood sugar goals; Exercise goal of 150 minutes per week; Carbohydrate  counting and/or plate method -Counseled to check feet daily and get yearly eye exams -Counseled on diet and exercise extensively  ED (Goal: minimize symptoms) -Controlled -Current treatment  . Sildenafil 37m, 2 to 5 tablets as needed -Medications previously tried: none  -Recommended to continue current medication  Health Maintenance -Vaccine gaps: shingrix, tetanus -Current therapy:  . No medications -Educated on Cost vs benefit of each product must be carefully weighed by individual consumer -Patient is satisfied with current therapy and denies issues -Recommended to continue current medication  Patient Goals/Self-Care Activities . Patient will:  - take medications as prescribed check blood pressure weekly, document, and provide at future appointments target a minimum of 150 minutes of moderate intensity exercise weekly engage in dietary modifications by adding more fiber to his diet to lower cholesterol  Follow Up Plan: Telephone follow up appointment with care management team member scheduled for: 6 months       Medication Assistance: None required.  Patient affirms current coverage meets needs.  [facility] [address] [place] [place]Phone: [phone]: [address]Phone: [phone]: [phone] Uses pill box? No - has own system Pt endorses 100% compliance  We discussed: Current pharmacy is preferred with insurance plan and patient is satisfied with pharmacy services Patient decided to: Utilize [facility] for medication synchronization, packaging and delivery  Care Plan and Follow Up Patient Decision:  Patient agrees to Care Plan and Follow-up.  Plan: Telephone follow up appointment with care management team member scheduled for:  6 months  MJeni Salles PharmD BLeaf RiverPharmacist [place]call[place]at BNorthwest [phone]

## 2020-09-28 NOTE — Patient Instructions (Addendum)
Hi David Cannon,  It was great to speak with you again! Below is a summary of some of the topics we discussed.   Don't forget to look into getting your shingles shot (shingrix) as well!  Please reach out to me if you have any questions or need anything before our follow up!  Best, Maddie  [name], PharmD, [place] [facility]  Visit Information  Goals Addressed   None    Patient Care Plan: CCM Pharmacy Care Plan    Problem Identified: Problem: Hypertension, Hyperlipidemia, Diabetes and ED     Long-Range Goal: Patient-Specific Goal   Start Date: 09/13/2020  Expected End Date: 09/13/2021  This Visit's Progress: On track  Priority: High  Note:   Current Barriers:  . Unable to independently monitor therapeutic efficacy  Pharmacist Clinical Goal(s):  Marland Kitchen Patient will achieve adherence to monitoring guidelines and medication adherence to achieve therapeutic efficacy through collaboration with PharmD and provider.   Interventions: . 1:1 collaboration with [name], NP regarding development and update of comprehensive plan of care as evidenced by provider attestation and co-signature . Inter-disciplinary care team collaboration (see longitudinal plan of care) . Comprehensive medication review performed; medication list updated in electronic medical record  Hypertension (BP goal <140/90) -Not ideally controlled -Current treatment: . olmesartan-hydrochlorothiazide 20-12.5mg , 1 tablet daily  -Medications previously tried: none  -Current home readings: 123/86 HR 72, 120s/70s (checking a couple times a week) -Current dietary habits: did not discuss -Current exercise habits: did not discuss -Denies hypotensive/hypertensive symptoms -Educated on Exercise goal of 150 minutes per week; Importance of home blood pressure monitoring; Proper BP monitoring technique; -Counseled to monitor BP at home weekly, document, and provide log  at future appointments -Counseled on diet and exercise extensively Recommended to continue current medication  Hyperlipidemia: (LDL goal < 100) -Uncontrolled -Current treatment: . No medications -Medications previously tried: simvastatin (unclear why it was stopped and patient does not remember taking it)  -Current dietary patterns: did not discuss -Current exercise habits: did not discuss -Educated on Cholesterol goals;  Benefits of statin for ASCVD risk reduction; Importance of limiting foods high in cholesterol; Exercise goal of 150 minutes per week; -Counseled on diet and exercise extensively Recommended statin therapy based on ASCVD risk  Diabetes (A1c goal <7%) -Controlled -Current medications: . No medications -Medications previously tried: metformin (d/c due to controlled A1c), pioglitazone (vision impairment), sitagliptin   -Current home glucose readings . fasting glucose: does not check . post prandial glucose: does not check -Denies hypoglycemic/hyperglycemic symptoms -Current meal patterns:  . breakfast: did not discuss  . lunch: did not discuss  . dinner: did not discuss . snacks: did not discuss . drinks: did not discuss -Current exercise: did not discuss -Educated on A1c and blood sugar goals; Exercise goal of 150 minutes per week; Carbohydrate counting and/or plate method -Counseled to check feet daily and get yearly eye exams -Counseled on diet and exercise extensively  ED (Goal: minimize symptoms) -Controlled -Current treatment  . Sildenafil 20mg , 2 to 5 tablets as needed -Medications previously tried: none  -Recommended to continue current medication  Health Maintenance -Vaccine gaps: shingrix, tetanus -Current therapy:  . No medications -Educated on Cost vs benefit of each product must be carefully weighed by individual consumer -Patient is satisfied with current therapy and denies issues -Recommended to continue current medication  Patient  Goals/Self-Care Activities . Patient will:  - take medications as prescribed check blood pressure weekly, document, and provide at future appointments  target a minimum of 150 minutes of moderate intensity exercise weekly engage in dietary modifications by adding more fiber to his diet to lower cholesterol  Follow Up Plan: Telephone follow up appointment with care management team member scheduled for: 6 months       Patient verbalizes understanding of instructions provided today and agrees to view in [place].  Telephone follow up appointment with pharmacy team member scheduled for: 6 months  Viona Gilmore, [facility]

## 2020-09-30 ENCOUNTER — Encounter: Payer: Self-pay | Admitting: Internal Medicine

## 2020-10-11 ENCOUNTER — Encounter: Payer: Self-pay | Admitting: Internal Medicine

## 2020-10-13 ENCOUNTER — Telehealth: Payer: Self-pay | Admitting: Pharmacist

## 2020-10-13 NOTE — Chronic Care Management (AMB) (Signed)
Chronic Care Management Pharmacy Assistant   Name: David Cannon  MRN: [id number] DOB: [birth year]  Reason for Encounter: Disease State   Conditions to be addressed/monitored: HTN  Recent office visits:  None  Recent consult visits:  None  Hospital visits:  None in previous 6 months  Medications: Outpatient Encounter Medications as of 10/13/2020  Medication Sig   benzonatate (TESSALON) 200 MG capsule Take 1 capsule (200 mg total) by mouth 2 (two) times daily as needed for cough.   latanoprost (XALATAN) 0.005 % ophthalmic solution Place 1 drop into both eyes at bedtime.   olmesartan-hydrochlorothiazide (BENICAR HCT) 20-12.5 MG tablet Take 1 tablet by mouth daily.   sildenafil (REVATIO) 20 MG tablet TAKE 2 TO 5 TABLETS BY MOUTH AS NEEDED (Patient not taking: Reported on 06/04/2020)   No facility-administered encounter medications on file as of 10/13/2020.    Reviewed chart prior to disease state call. Spoke with patient regarding BP  Recent Office Vitals: BP Readings from Last 3 Encounters:  06/04/20 (!) 166/92  01/07/20 122/80  08/28/19 102/68   Pulse Readings from Last 3 Encounters:  06/04/20 77  01/07/20 (!) 57  05/23/19 77    Wt Readings from Last 3 Encounters:  06/04/20 213 lb (96.6 kg)  01/07/20 209 lb 3.2 oz (94.9 kg)  08/28/19 221 lb (100.2 kg)     Kidney Function Lab Results  Component Value Date/Time   CREATININE 1.46 05/23/2019 10:15 AM   CREATININE 1.51 (H) 05/16/2018 01:36 PM   CREATININE 1.54 (H) 12/11/2014 04:20 PM   GFR 57.66 (L) 05/23/2019 10:15 AM   GFRNONAA 69.07 12/10/2009 09:42 AM   GFRAA 67 12/26/2007 09:37 AM    BMP Latest Ref Rng & Units 05/23/2019 05/16/2018 04/05/2017  Glucose 70 - 99 mg/dL 108(H) 81 99  BUN 6 - 23 mg/dL 20 17 17   Creatinine 0.40 - 1.50 mg/dL 1.46 1.51(H) 1.44  Sodium 135 - 145 mEq/L 140 141 140  Potassium 3.5 - 5.1 mEq/L 4.1 4.3 4.1  Chloride 96 - 112 mEq/L 103 105 105  CO2 19 - 32 mEq/L 27 30 28   Calcium 8.4 -  10.5 mg/dL 9.3 9.7 9.1    Current antihypertensive regimen:  Olmesartan-hydrochlorothiazide 20-12.5 mg, 1 tablet daily How often are you checking your Blood Pressure? infrequently Current home BP readings: None What recent interventions/DTPs have been made by any provider to improve Blood Pressure control since last CPP Visit: None Any recent hospitalizations or ED visits since last visit with CPP? No What diet changes have been made to improve Blood Pressure Control?  No Change What exercise is being done to improve your Blood Pressure Control?  No Change  Adherence Review: Is the patient currently on ACE/ARB medication? Yes Does the patient have >5 day gap between last estimated fill dates? No  I spoke with the patient and discuss medication adherence he states that he has been taking his blood pressure infrequently. He did not have any current numbers. He states that he has been doing well. There have been no recent changes to his medications. He states that he is not experiencing any side effects from his medications. There have been no hospital visits or urgent care visits since its last CPP or PCP visit. He was encouraged to buy a notebook to record his blood pressure readings and keep better track of them. He understood. He is scheduled to see his primary care doctor in July 2022 and CCM in November. He does not have any issues  with his current pharmacy.  Star Rating Drugs: Medication Dispensed  Quantity Pharmacy  Olmesartan-hydrochlorothiazide 20-12.5 mg 04.14.2022 90 [facility]    Amilia (Revonda Standard, [place] Pharmacist Assistant [phone]

## 2020-11-02 ENCOUNTER — Other Ambulatory Visit: Payer: Self-pay

## 2020-11-03 ENCOUNTER — Encounter: Payer: Self-pay | Admitting: Adult Health

## 2020-11-03 ENCOUNTER — Ambulatory Visit (INDEPENDENT_AMBULATORY_CARE_PROVIDER_SITE_OTHER): Payer: Medicare Other | Admitting: Adult Health

## 2020-11-03 VITALS — BP 120/80 | HR 93 | Temp 97.5°F | Ht 72.5 in | Wt 215.0 lb

## 2020-11-03 DIAGNOSIS — R7303 Prediabetes: Secondary | ICD-10-CM | POA: Diagnosis not present

## 2020-11-03 DIAGNOSIS — Z125 Encounter for screening for malignant neoplasm of prostate: Secondary | ICD-10-CM | POA: Diagnosis not present

## 2020-11-03 DIAGNOSIS — Z0000 Encounter for general adult medical examination without abnormal findings: Secondary | ICD-10-CM

## 2020-11-03 DIAGNOSIS — E785 Hyperlipidemia, unspecified: Secondary | ICD-10-CM

## 2020-11-03 DIAGNOSIS — I10 Essential (primary) hypertension: Secondary | ICD-10-CM | POA: Diagnosis not present

## 2020-11-03 LAB — CBC WITH DIFFERENTIAL/PLATELET
Basophils Absolute: 0 10*3/uL (ref 0.0–0.1)
Basophils Relative: 0.4 % (ref 0.0–3.0)
Eosinophils Absolute: 0.2 10*3/uL (ref 0.0–0.7)
Eosinophils Relative: 3 % (ref 0.0–5.0)
HCT: 41.5 % (ref 39.0–52.0)
Hemoglobin: 13.5 g/dL (ref 13.0–17.0)
Lymphocytes Relative: 51.8 % — ABNORMAL HIGH (ref 12.0–46.0)
Lymphs Abs: 2.6 10*3/uL (ref 0.7–4.0)
MCHC: 32.6 g/dL (ref 30.0–36.0)
MCV: 72.6 fl — ABNORMAL LOW (ref 78.0–100.0)
Monocytes Absolute: 0.4 10*3/uL (ref 0.1–1.0)
Monocytes Relative: 7.1 % (ref 3.0–12.0)
Neutro Abs: 1.9 10*3/uL (ref 1.4–7.7)
Neutrophils Relative %: 37.7 % — ABNORMAL LOW (ref 43.0–77.0)
Platelets: 192 10*3/uL (ref 150.0–400.0)
RBC: 5.72 Mil/uL (ref 4.22–5.81)
RDW: 16.6 % — ABNORMAL HIGH (ref 11.5–15.5)
WBC: 5 10*3/uL (ref 4.0–10.5)

## 2020-11-03 LAB — COMPREHENSIVE METABOLIC PANEL
ALT: 18 U/L (ref 0–53)
AST: 15 U/L (ref 0–37)
Albumin: 4.3 g/dL (ref 3.5–5.2)
Alkaline Phosphatase: 43 U/L (ref 39–117)
BUN: 22 mg/dL (ref 6–23)
CO2: 31 mEq/L (ref 19–32)
Calcium: 9.3 mg/dL (ref 8.4–10.5)
Chloride: 103 mEq/L (ref 96–112)
Creatinine, Ser: 1.43 mg/dL (ref 0.40–1.50)
GFR: 49.11 mL/min — ABNORMAL LOW (ref >60.00)
Glucose, Bld: 91 mg/dL (ref 70–99)
Potassium: 4.5 mEq/L (ref 3.5–5.1)
Sodium: 139 mEq/L (ref 135–145)
Total Bilirubin: 0.5 mg/dL (ref 0.2–1.2)
Total Protein: 7.2 g/dL (ref 6.0–8.3)

## 2020-11-03 LAB — LIPID PANEL
Cholesterol: 167 mg/dL (ref 0–200)
HDL: 39.1 mg/dL (ref >39.00)
LDL Cholesterol: 105 mg/dL — ABNORMAL HIGH (ref 0–99)
NonHDL: 127.77
Total CHOL/HDL Ratio: 4
Triglycerides: 115 mg/dL (ref 0.0–149.0)
VLDL: 23 mg/dL (ref 0.0–40.0)

## 2020-11-03 LAB — TSH: TSH: 1.28 u[IU]/mL (ref 0.35–5.50)

## 2020-11-03 LAB — HEMOGLOBIN A1C: Hgb A1c MFr Bld: 6.4 % (ref 4.6–6.5)

## 2020-11-03 LAB — PSA: PSA: 0.59 ng/mL (ref 0.10–4.00)

## 2020-11-03 NOTE — Progress Notes (Signed)
Subjective:    Patient ID: David Cannon, male    DOB: 01/21/1951, 70 y.o.   MRN: [id number]  HPI Patient presents for yearly preventative medicine examination. 70 year old male who  has a past medical history of Hyperlipidemia and Personal history of colonic polyps (03/08/2001).  He continues to follow up at the [facility] routinely as well   Essential Hypertension -takes Benicar HCT.  His blood pressures have been very well controlled in the past.  He denies dizziness, lightheadedness, chest pain, or shortness of breath  BP Readings from Last 3 Encounters:  11/03/20 120/80  06/04/20 (!) 166/92  01/07/20 122/80   Hyperlipidemia - diet controlled in the past   Lab Results  Component Value Date   CHOL 163 05/23/2019   HDL 34.90 (L) 05/23/2019   LDLCALC 101 (H) 05/23/2019   TRIG 133.0 05/23/2019   CHOLHDL 5 05/23/2019   Prediabetes -  His A1c had reached 6.7.  He was able to lose significant weight through lifestyle modification and his A1c had dropped to 5.5 back in September 2021. He reports that about a month ago he restarted his Metformin that was prescribed but he never really took. He was getting blood sugar readings in the upwards of 120's at times. He is taking Metformin 500 3-4 times a week.   All immunizations and health maintenance protocols were reviewed with the patient and needed orders were placed. He will be getting his shingle vaccination at the [facility]   Appropriate screening laboratory values were ordered for the patient including screening of hyperlipidemia, renal function and hepatic function. If indicated by BPH, a PSA was ordered.  Medication reconciliation,  past medical history, social history, problem list and allergies were reviewed in detail with the patient  Goals were established with regard to weight loss, exercise, and  diet in compliance with medications. He stay very active and eat healthy.  Wt Readings from Last 3 Encounters:  11/03/20 215 lb (97.5 kg)   06/04/20 213 lb (96.6 kg)  01/07/20 209 lb 3.2 oz (94.9 kg)   His colonoscopy is scheduled for September 2022.   Review of Systems  Constitutional: Negative.   HENT: Negative.    Eyes: Negative.   Respiratory: Negative.    Cardiovascular: Negative.   Gastrointestinal: Negative.   Endocrine: Negative.   Genitourinary: Negative.   Musculoskeletal: Negative.   Skin: Negative.   Allergic/Immunologic: Negative.   Neurological: Negative.   Hematological: Negative.   Psychiatric/Behavioral: Negative.    All other systems reviewed and are negative.  Past Medical History:  Diagnosis Date   Hyperlipidemia    Personal history of colonic polyps 03/08/2001    Social History   Socioeconomic History   Marital status: Married    Spouse name: Not on file   Number of children: Not on file   Years of education: Not on file   Highest education level: Not on file  Occupational History   Not on file  Tobacco Use   Smoking status: Never   Smokeless tobacco: Never  Substance and Sexual Activity   Alcohol use: Yes    Alcohol/week: 0.0 standard drinks    Comment: once every 6 months   Drug use: No   Sexual activity: Not on file  Other Topics Concern   Not on file  Social History Narrative   Retired from working at the [employer]    Married    Daughter and step son   Three grand children  He likes to go to the beach and spending time with his wife.       Social Determinants of Health   Financial Resource Strain: Low Risk    Difficulty of Paying Living Expenses: Not hard at all  Food Insecurity: No Food Insecurity   Worried About Charity fundraiser in the Last Year: Never true   [place] in the Last Year: Never true  Transportation Needs: No Transportation Needs   Lack of Transportation (Medical): No   Lack of Transportation (Non-Medical): No  Physical Activity: Inactive   Days of Exercise per Week: 0 days   Minutes of Exercise per Session: 0 min  Stress: No  Stress Concern Present   Feeling of Stress : Not at all  Social Connections: Moderately Integrated   Frequency of Communication with Friends and Family: Three times a week   Frequency of Social Gatherings with Friends and Family: Three times a week   Attends Religious Services: More than 4 times per year   Active Member of Clubs or Organizations: No   Attends Archivist Meetings: Never   Marital Status: Married  Human resources officer Violence: Not At Risk   Fear of Current or Ex-Partner: No   Emotionally Abused: No   Physically Abused: No   Sexually Abused: No    Past Surgical History:  Procedure Laterality Date   COLONOSCOPY W/ POLYPECTOMY      Family History  Problem Relation Age of Onset   Colon cancer Father        prostate   Lung cancer Mother        lung   Cancer Maternal Grandfather        colon   Colon cancer Maternal Grandfather    Coronary artery disease Neg Hx     No Known Allergies  Current Outpatient Medications on File Prior to Visit  Medication Sig Dispense Refill   latanoprost (XALATAN) 0.005 % ophthalmic solution Place 1 drop into both eyes at bedtime.     latanoprost (XALATAN) 0.005 % ophthalmic solution Apply to eye.     olmesartan-hydrochlorothiazide (BENICAR HCT) 20-12.5 MG tablet Take 1 tablet by mouth daily. 90 tablet 0   omeprazole (PRILOSEC) 20 MG capsule Take 1 capsule by mouth daily as needed.     sildenafil (REVATIO) 20 MG tablet TAKE 2 TO 5 TABLETS BY MOUTH AS NEEDED 90 tablet 1   No current facility-administered medications on file prior to visit.    BP 120/80   Pulse 93   Temp (!) 97.5 F (36.4 C) (Oral)   Ht 6' 0.5" (1.842 m)   Wt 215 lb (97.5 kg)   SpO2 99%   BMI 28.76 kg/m       Objective:   Physical Exam Vitals and nursing note reviewed.  Constitutional:      General: He is not in acute distress.    Appearance: Normal appearance. He is well-developed and normal weight.  HENT:     Head: Normocephalic and  atraumatic.     Right Ear: Tympanic membrane, ear canal and external ear normal. There is no impacted cerumen.     Left Ear: Tympanic membrane, ear canal and external ear normal. There is no impacted cerumen.     Nose: Nose normal. No congestion or rhinorrhea.     Mouth/Throat:     Mouth: Mucous membranes are moist.     Pharynx: Oropharynx is clear. No oropharyngeal exudate or posterior oropharyngeal erythema.  Eyes:  General:        Right eye: No discharge.        Left eye: No discharge.     Extraocular Movements: Extraocular movements intact.     Conjunctiva/sclera: Conjunctivae normal.     Pupils: Pupils are equal, round, and reactive to light.  Neck:     Vascular: No carotid bruit.     Trachea: No tracheal deviation.  Cardiovascular:     Rate and Rhythm: Normal rate and regular rhythm.     Pulses: Normal pulses.     Heart sounds: Normal heart sounds. No murmur heard.   No friction rub. No gallop.  Pulmonary:     Effort: Pulmonary effort is normal. No respiratory distress.     Breath sounds: Normal breath sounds. No stridor. No wheezing, rhonchi or rales.  Chest:     Chest wall: No tenderness.  Abdominal:     General: Bowel sounds are normal. There is no distension.     Palpations: Abdomen is soft. There is no mass.     Tenderness: There is no abdominal tenderness. There is no right CVA tenderness, left CVA tenderness, guarding or rebound.     Hernia: No hernia is present.  Musculoskeletal:        General: No swelling, tenderness, deformity or signs of injury. Normal range of motion.     Right lower leg: No edema.     Left lower leg: No edema.  Lymphadenopathy:     Cervical: No cervical adenopathy.  Skin:    General: Skin is warm and dry.     Capillary Refill: Capillary refill takes less than 2 seconds.     Coloration: Skin is not jaundiced or pale.     Findings: No bruising, erythema, lesion or rash.  Neurological:     General: No focal deficit present.     Mental  Status: He is alert and oriented to person, place, and time.     Cranial Nerves: No cranial nerve deficit.     Sensory: No sensory deficit.     Motor: No weakness.     Coordination: Coordination normal.     Gait: Gait normal.     Deep Tendon Reflexes: Reflexes normal.  Psychiatric:        Mood and Affect: Mood normal.        Behavior: Behavior normal.        Thought Content: Thought content normal.        Judgment: Judgment normal.      Assessment & Plan:   1. Routine general medical examination at a health care facility - Continue to stay active and eat healthy  - Follow up in one year or sooner if needed - CBC with Differential/Platelet; Future - Comprehensive metabolic panel; Future - Hemoglobin A1c; Future - Lipid panel; Future - TSH; Future  2. Essential hypertension - Well controlled. No change in medication - CBC with Differential/Platelet; Future - Comprehensive metabolic panel; Future - Hemoglobin A1c; Future - Lipid panel; Future - TSH; Future  3. Hyperlipidemia, unspecified hyperlipidemia type - Consider statin  - CBC with Differential/Platelet; Future - Comprehensive metabolic panel; Future - Hemoglobin A1c; Future - Lipid panel; Future - TSH; Future  4. Prostate cancer screening  - PSA; Future  5. Borderline diabetic  - likely will not need Metformin  - CBC with Differential/Platelet; Future - Comprehensive metabolic panel; Future - Hemoglobin A1c; Future - Lipid panel; Future - TSH; Future   [name], NP

## 2020-11-03 NOTE — Addendum Note (Signed)
Addended by: Amanda Cockayne on: 11/03/2020 10:04 AM   Modules accepted: Orders

## 2020-11-03 NOTE — Patient Instructions (Addendum)
It was great seeing you today!   Keep staying active and eating healthy   We will follow up with you regarding your labs   I will see you back in one year or sooner if needed

## 2020-11-04 ENCOUNTER — Other Ambulatory Visit: Payer: Self-pay | Admitting: Adult Health

## 2020-11-04 DIAGNOSIS — N289 Disorder of kidney and ureter, unspecified: Secondary | ICD-10-CM

## 2020-11-22 ENCOUNTER — Other Ambulatory Visit: Payer: Self-pay | Admitting: Family Medicine

## 2020-11-22 DIAGNOSIS — I10 Essential (primary) hypertension: Secondary | ICD-10-CM

## 2020-12-07 DIAGNOSIS — E119 Type 2 diabetes mellitus without complications: Secondary | ICD-10-CM | POA: Diagnosis not present

## 2020-12-13 ENCOUNTER — Telehealth: Payer: Self-pay | Admitting: Pharmacist

## 2020-12-13 NOTE — Chronic Care Management (AMB) (Signed)
Chronic Care Management Pharmacy Assistant   Name: David Cannon  MRN: [id number] DOB: [birth year]  Reason for Encounter: Disease State/ Hypertension Assessment Call   Conditions to be addressed/monitored: HTN  Recent office visits:  11-03-2020 David Peng, NP - Patient presented for routine general exam and other concerns. Stopped Benzonatate 200 mg.   Recent consult visits:  12-13-2020 [facility]- Patient presented for immunizations. No medication changes.   Hospital visits:  None in previous 6 months  Medications: Outpatient Encounter Medications as of 12/13/2020  Medication Sig   latanoprost (XALATAN) 0.005 % ophthalmic solution Place 1 drop into both eyes at bedtime.   latanoprost (XALATAN) 0.005 % ophthalmic solution Apply to eye.   olmesartan-hydrochlorothiazide (BENICAR HCT) 20-12.5 MG tablet Take 1 tablet by mouth once daily   omeprazole (PRILOSEC) 20 MG capsule Take 1 capsule by mouth daily as needed.   sildenafil (REVATIO) 20 MG tablet TAKE 2 TO 5 TABLETS BY MOUTH AS NEEDED   No facility-administered encounter medications on file as of 12/13/2020.  Reviewed chart prior to disease state call. Spoke with patient regarding BP  Recent Office Vitals: BP Readings from Last 3 Encounters:  11/03/20 120/80  06/04/20 (!) 166/92  01/07/20 122/80   Pulse Readings from Last 3 Encounters:  11/03/20 93  06/04/20 77  01/07/20 (!) 57    Wt Readings from Last 3 Encounters:  11/03/20 215 lb (97.5 kg)  06/04/20 213 lb (96.6 kg)  01/07/20 209 lb 3.2 oz (94.9 kg)     Kidney Function Lab Results  Component Value Date/Time   CREATININE 1.43 11/03/2020 10:04 AM   CREATININE 1.46 05/23/2019 10:15 AM   CREATININE 1.54 (H) 12/11/2014 04:20 PM   GFR 49.11 (L) 11/03/2020 10:04 AM   GFRNONAA 69.07 12/10/2009 09:42 AM   GFRAA 67 12/26/2007 09:37 AM    BMP Latest Ref Rng & Units 11/03/2020 05/23/2019 05/16/2018  Glucose 70 - 99 mg/dL 91 108(H) 81  BUN 6 - 23 mg/dL '22  20 17  '$ Creatinine 0.40 - 1.50 mg/dL 1.43 1.46 1.51(H)  Sodium 135 - 145 mEq/L 139 140 141  Potassium 3.5 - 5.1 mEq/L 4.5 4.1 4.3  Chloride 96 - 112 mEq/L 103 103 105  CO2 19 - 32 mEq/L '31 27 30  '$ Calcium 8.4 - 10.5 mg/dL 9.3 9.3 9.7    Current antihypertensive regimen:  olmesartan-hydrochlorothiazide 20-12.'5mg'$ , 1 tablet daily  How often are you checking your Blood Pressure? Patient reports he is checking daily and has been since August 1st 2022 Current home BP readings: Month of August 8-1 (112/72) p 79 8-2 (100/58) p 76 8-3 (95/56) p 74 8-4 ( 106/61) p 72 8-5 ( 112/58) p 79 8-6 (104/58) p 74 8-7 (108/63) p 75 8-8 (113/68) p 74 8-9 (120/68) p 75 8-10 (108/63) p 66 8-11 (97/64) p 81 8-12 (102/65) p 83 8-13 ( 107/63) p 75 8-14 (115/68) p 70 8-15 (108/63) p 74 8-16 (114/67) p 68 What recent interventions/DTPs have been made by any provider to improve Blood Pressure control since last CPP Visit: Patient reports none Any recent hospitalizations or ED visits since last visit with CPP? None What diet changes have been made to improve Blood Pressure Control?  Patient reports his wife is doing all of the cooking and he has cut back on adding salt to his foods and she does not use any when cooking. He reports he has a good appetite and for breakfast is having eggs [place] sausage and toast. For lunches  and dinners he is is staying away from starches and fried foods, he is getting in some vegetables and eats a lot of chicken in his meals. He reports he is drinking water and enjoys [employer] ice drinks. What exercise is being done to improve your Blood Pressure Control?  Patient reports he is getting around well without the use of an assisted device and is very active, he was currently out running errands.  Adherence Review: Is the patient currently on ACE/ARB medication?Yes  Does the patient have >5 day gap between last estimated fill dates? No  Notes:  Patient confirmed that  he is taking the above medication for his HTN, he reports he is not currently in need of any fills on any medications, that his pharmacy will usually call him when he is almost out. He will email me a copy of all his pressures for the month and is not having any other concerns or issues at this time.  Care Gaps: AWV - Scheduled for 12-24-2020 CCM F/U Call - Scheduled for 03-14-21 12 pm Zoster Vaccine - Overdue TDAP - Overdue Colonoscopy - Overdue Flu Vaccine - Overdue  Star Rating Drugs: Olmesartan- HCTZ (Benicar HCT) 20-12.5 mg- Last filled 11-22-2020 90 DS at [place] Pharmacist Assistant [phone]

## 2020-12-24 ENCOUNTER — Telehealth: Payer: Self-pay | Admitting: Pharmacist

## 2020-12-24 ENCOUNTER — Other Ambulatory Visit: Payer: Self-pay

## 2020-12-24 ENCOUNTER — Ambulatory Visit (INDEPENDENT_AMBULATORY_CARE_PROVIDER_SITE_OTHER): Payer: Medicare Other

## 2020-12-24 VITALS — BP 132/72 | HR 74 | Temp 98.0°F | Ht 72.0 in | Wt 214.0 lb

## 2020-12-24 DIAGNOSIS — Z0000 Encounter for general adult medical examination without abnormal findings: Secondary | ICD-10-CM

## 2020-12-24 NOTE — Patient Instructions (Signed)
David Cannon , Thank you for taking time to come for your Medicare Wellness Visit. I appreciate your ongoing commitment to your health goals. Please review the following plan we discussed and let me know if I can assist you in the future.   Screening recommendations/referrals: Colonoscopy: Scheduled 01/13/2021  [facility] Recommended yearly ophthalmology/optometry visit for glaucoma screening and checkup Recommended yearly dental visit for hygiene and checkup  Vaccinations: Influenza vaccine: due fall 2022  Pneumococcal vaccine: completed series  Tdap vaccine: 11/09/2020 Shingles vaccine: Will obtain at the [facility]     Advanced directives: none   Conditions/risks identified: none   Next appointment: none   Preventive Care 65 Years and Older, Male Preventive care refers to lifestyle choices and visits with your health care provider that can promote health and wellness. What does preventive care include? A yearly physical exam. This is also called an annual well check. Dental exams once or twice a year. Routine eye exams. Ask your health care provider how often you should have your eyes checked. Personal lifestyle choices, including: Daily care of your teeth and gums. Regular physical activity. Eating a healthy diet. Avoiding tobacco and drug use. Limiting alcohol use. Practicing safe sex. Taking low doses of aspirin every day. Taking vitamin and mineral supplements as recommended by your health care provider. What happens during an annual well check? The services and screenings done by your health care provider during your annual well check will depend on your age, overall health, lifestyle risk factors, and family history of disease. Counseling  Your health care provider may ask you questions about your: Alcohol use. Tobacco use. Drug use. Emotional well-being. Home and relationship well-being. Sexual activity. Eating habits. History of falls. Memory and ability to understand  (cognition). Work and work Statistician. Screening  You may have the following tests or measurements: Height, weight, and BMI. Blood pressure. Lipid and cholesterol levels. These may be checked every 5 years, or more frequently if you are over 25 years old. Skin check. Lung cancer screening. You may have this screening every year starting at age 15 if you have a 30-pack-year history of smoking and currently smoke or have quit within the past 15 years. Fecal occult blood test (FOBT) of the stool. You may have this test every year starting at age 47. Flexible sigmoidoscopy or colonoscopy. You may have a sigmoidoscopy every 5 years or a colonoscopy every 10 years starting at age 66. Prostate cancer screening. Recommendations will vary depending on your family history and other risks. Hepatitis C blood test. Hepatitis B blood test. Sexually transmitted disease (STD) testing. Diabetes screening. This is done by checking your blood sugar (glucose) after you have not eaten for a while (fasting). You may have this done every 1-3 years. Abdominal aortic aneurysm (AAA) screening. You may need this if you are a current or former smoker. Osteoporosis. You may be screened starting at age 59 if you are at high risk. Talk with your health care provider about your test results, treatment options, and if necessary, the need for more tests. Vaccines  Your health care provider may recommend certain vaccines, such as: Influenza vaccine. This is recommended every year. Tetanus, diphtheria, and acellular pertussis (Tdap, Td) vaccine. You may need a Td booster every 10 years. Zoster vaccine. You may need this after age 81. Pneumococcal 13-valent conjugate (PCV13) vaccine. One dose is recommended after age 37. Pneumococcal polysaccharide (PPSV23) vaccine. One dose is recommended after age 25. Talk to your health care provider about which  screenings and vaccines you need and how often you need them. This  information is not intended to replace advice given to you by your health care provider. Make sure you discuss any questions you have with your health care provider. Document Released: 05/14/2015 Document Revised: 01/05/2016 Document Reviewed: 02/16/2015 Elsevier Interactive Patient Education  2017 [place] Prevention in the Home Falls can cause injuries. They can happen to people of all ages. There are many things you can do to make your home safe and to help prevent falls. What can I do on the outside of my home? Regularly fix the edges of walkways and driveways and fix any cracks. Remove anything that might make you trip as you walk through a door, such as a raised step or threshold. Trim any bushes or trees on the path to your home. Use bright outdoor lighting. Clear any walking paths of anything that might make someone trip, such as rocks or tools. Regularly check to see if handrails are loose or broken. Make sure that both sides of any steps have handrails. Any raised decks and porches should have guardrails on the edges. Have any leaves, snow, or ice cleared regularly. Use sand or salt on walking paths during winter. Clean up any spills in your garage right away. This includes oil or grease spills. What can I do in the bathroom? Use night lights. Install grab bars by the toilet and in the tub and shower. Do not use towel bars as grab bars. Use non-skid mats or decals in the tub or shower. If you need to sit down in the shower, use a plastic, non-slip stool. Keep the floor dry. Clean up any water that spills on the floor as soon as it happens. Remove soap buildup in the tub or shower regularly. Attach bath mats securely with double-sided non-slip rug tape. Do not have throw rugs and other things on the floor that can make you trip. What can I do in the bedroom? Use night lights. Make sure that you have a light by your bed that is easy to reach. Do not use any sheets or  blankets that are too big for your bed. They should not hang down onto the floor. Have a firm chair that has side arms. You can use this for support while you get dressed. Do not have throw rugs and other things on the floor that can make you trip. What can I do in the kitchen? Clean up any spills right away. Avoid walking on wet floors. Keep items that you use a lot in easy-to-reach places. If you need to reach something above you, use a strong step stool that has a grab bar. Keep electrical cords out of the way. Do not use floor polish or wax that makes floors slippery. If you must use wax, use non-skid floor wax. Do not have throw rugs and other things on the floor that can make you trip. What can I do with my stairs? Do not leave any items on the stairs. Make sure that there are handrails on both sides of the stairs and use them. Fix handrails that are broken or loose. Make sure that handrails are as long as the stairways. Check any carpeting to make sure that it is firmly attached to the stairs. Fix any carpet that is loose or worn. Avoid having throw rugs at the top or bottom of the stairs. If you do have throw rugs, attach them to the floor with carpet  tape. Make sure that you have a light switch at the top of the stairs and the bottom of the stairs. If you do not have them, ask someone to add them for you. What else can I do to help prevent falls? Wear shoes that: Do not have high heels. Have rubber bottoms. Are comfortable and fit you well. Are closed at the toe. Do not wear sandals. If you use a stepladder: Make sure that it is fully opened. Do not climb a closed stepladder. Make sure that both sides of the stepladder are locked into place. Ask someone to hold it for you, if possible. Clearly mark and make sure that you can see: Any grab bars or handrails. First and last steps. Where the edge of each step is. Use tools that help you move around (mobility aids) if they are  needed. These include: Canes. Walkers. Scooters. Crutches. Turn on the lights when you go into a dark area. Replace any light bulbs as soon as they burn out. Set up your furniture so you have a clear path. Avoid moving your furniture around. If any of your floors are uneven, fix them. If there are any pets around you, be aware of where they are. Review your medicines with your doctor. Some medicines can make you feel dizzy. This can increase your chance of falling. Ask your doctor what other things that you can do to help prevent falls. This information is not intended to replace advice given to you by your health care provider. Make sure you discuss any questions you have with your health care provider. Document Released: 02/11/2009 Document Revised: 09/23/2015 Document Reviewed: 05/22/2014 Elsevier Interactive Patient Education  2017 [employer].

## 2020-12-24 NOTE — Progress Notes (Signed)
Subjective:   David Cannon is a 70 y.o. male who presents for an Subsequent Medicare Annual Wellness Visit.  Review of Systems    N/a       Objective:    There were no vitals filed for this visit. There is no height or weight on file to calculate BMI.  Advanced Directives 01/12/2020 06/28/2015 06/14/2015  Does Patient Have a Medical Advance Directive? No No No  Would patient like information on creating a medical advance directive? No - Patient declined - No - patient declined information    Current Medications (verified) Outpatient Encounter Medications as of 12/24/2020  Medication Sig   latanoprost (XALATAN) 0.005 % ophthalmic solution Place 1 drop into both eyes at bedtime.   latanoprost (XALATAN) 0.005 % ophthalmic solution Apply to eye.   olmesartan-hydrochlorothiazide (BENICAR HCT) 20-12.5 MG tablet Take 1 tablet by mouth once daily   omeprazole (PRILOSEC) 20 MG capsule Take 1 capsule by mouth daily as needed.   sildenafil (REVATIO) 20 MG tablet TAKE 2 TO 5 TABLETS BY MOUTH AS NEEDED   No facility-administered encounter medications on file as of 12/24/2020.    Allergies (verified) Patient has no known allergies.   History: Past Medical History:  Diagnosis Date   Hyperlipidemia    Personal history of colonic polyps 03/08/2001   Past Surgical History:  Procedure Laterality Date   COLONOSCOPY W/ POLYPECTOMY     Family History  Problem Relation Age of Onset   Colon cancer Father        prostate   Lung cancer Mother        lung   Cancer Maternal Grandfather        colon   Colon cancer Maternal Grandfather    Coronary artery disease Neg Hx    Social History   Socioeconomic History   Marital status: Married    Spouse name: Not on file   Number of children: Not on file   Years of education: Not on file   Highest education level: Not on file  Occupational History   Not on file  Tobacco Use   Smoking status: Never   Smokeless tobacco: Never  Substance  and Sexual Activity   Alcohol use: Yes    Alcohol/week: 0.0 standard drinks    Comment: once every 6 months   Drug use: No   Sexual activity: Not on file  Other Topics Concern   Not on file  Social History Narrative   Retired from working at the post office    Married    Daughter and step son   Three grand children       He likes to go to [employer] and spending time with his wife.       Social Determinants of Health   Financial Resource Strain: Low Risk    Difficulty of Paying Living Expenses: Not hard at all  Food Insecurity: No Food Insecurity   Worried About Charity fundraiser in the Last Year: Never true   [place] in the Last Year: Never true  Transportation Needs: No Transportation Needs   Lack of Transportation (Medical): No   Lack of Transportation (Non-Medical): No  Physical Activity: Inactive   Days of Exercise per Week: 0 days   Minutes of Exercise per Session: 0 min  Stress: No Stress Concern Present   Feeling of Stress : Not at all  Social Connections: Moderately Integrated   Frequency of Communication with Friends and Family: Three times  a week   Frequency of Social Gatherings with Friends and Family: Three times a week   Attends Religious Services: More than 4 times per year   Active Member of Clubs or Organizations: No   Attends Archivist Meetings: Never   Marital Status: Married    Tobacco Counseling Counseling given: Not Answered   Clinical Intake:                 Diabetic?no         Activities of Daily Living In your present state of health, do you have any difficulty performing the following activities: 01/12/2020  Hearing? N  Vision? N  Difficulty concentrating or making decisions? N  Walking or climbing stairs? N  Dressing or bathing? N  Doing errands, shopping? N  Preparing Food and eating ? N  Using the Toilet? N  In the past six months, have you accidently leaked urine? N  Do you have problems with  loss of bowel control? N  Managing your Medications? N  Managing your Finances? N  Housekeeping or managing your Housekeeping? N  Some recent data might be hidden    Patient Care Team: [name], NP as PCP - General (Family Medicine) Viona Gilmore, [facility] as Pharmacist (Pharmacist)  Indicate any recent Medical Services you may have received from other than [facility] providers in the past year (date may be approximate).     Assessment:   This is a routine wellness examination for [name].  Hearing/Vision screen No results found.  Dietary issues and exercise activities discussed:     Goals Addressed   None    Depression Screen PHQ 2/9 Scores 01/12/2020 01/07/2020 05/16/2018 03/01/2016 04/30/2015 11/22/2014 09/26/2014  PHQ - 2 Score 0 0 0 0 0 0 0  PHQ- 9 Score 0 - - - - - -    Fall Risk Fall Risk  01/12/2020 01/07/2020 05/16/2018 03/19/2018 03/01/2016  Falls in the past year? 0 0 0 0 No  Comment - - - [place] Telephone Survey: data to providers prior to load -  Number falls in past yr: 0 0 - - -  Injury with Fall? 0 0 - - -  Risk for fall due to : No Fall Risks - - - -  Follow up Falls evaluation completed;Falls prevention discussed Falls evaluation completed - - -    FALL RISK PREVENTION PERTAINING TO THE HOME:  Any stairs in or around the home? No  If so, are there any without handrails? No  Home free of loose throw rugs in walkways, pet beds, electrical cords, etc? Yes  Adequate lighting in your home to reduce risk of falls? Yes   ASSISTIVE DEVICES UTILIZED TO PREVENT FALLS:  Life alert? No  Use of a cane, walker or w/c? No  Grab bars in the bathroom? No  Shower chair or bench in shower? No  Elevated toilet seat or a handicapped toilet? No   TIMED UP AND GO:  Was the test performed? Yes .  Length of time to ambulate 10 feet: 10 sec.   Gait steady and fast without use of assistive device  Cognitive Function:  Normal cognitive status assessed by direct observation by  this Nurse Health Advisor. No abnormalities found.     6CIT Screen 01/12/2020  What Year? 0 points  What month? 0 points  What time? 0 points  Count back from 20 0 points  Months in reverse 0 points  Repeat phrase 4 points  Total Score  4    Immunizations Immunization History  Administered Date(s) Administered   DTP 10/30/2002   Influenza Split 03/02/2011   Influenza Whole 03/18/2007, 05/07/2009   Influenza, High Dose Seasonal PF 01/17/2018, 01/30/2020   Influenza,inj,Quad PF,6+ Mos 01/12/2016, 12/31/2018   Influenza-Unspecified 02/22/2014   PFIZER(Purple Top)SARS-COV-2 Vaccination 05/20/2019, 06/10/2019   Pneumococcal Conjugate-13 01/12/2016   Pneumococcal Polysaccharide-23 01/13/2013, 07/15/2020   Td 12/19/2007   Tdap 12/19/2007   Zoster, Live 03/16/2016    TDAP status: Up to date  Flu Vaccine status: Up to date  Pneumococcal vaccine status: Up to date  Covid-19 vaccine status: Completed vaccines  Qualifies for Shingles Vaccine? Yes   Zostavax completed No   Shingrix Completed?: No.    Education has been provided regarding the importance of this vaccine. Patient has been advised to call insurance company to determine out of pocket expense if they have not yet received this vaccine. Advised may also receive vaccine at local pharmacy or Health Dept. Verbalized acceptance and understanding.  Screening Tests Health Maintenance  Topic Date Due   Zoster Vaccines- Shingrix (1 of 2) Never done   TETANUS/TDAP  12/18/2017   COLONOSCOPY (Pts 45-81yr Insurance coverage will need to be confirmed)  06/27/2020   COVID-19 Vaccine (4 - Booster for Pfizer series) 12/24/2020   INFLUENZA VACCINE  11/29/2020   Hepatitis C Screening  Completed   PNA vac Low Risk Adult  Completed   HPV VACCINES  Aged Out   FOOT EXAM  Discontinued   HEMOGLOBIN A1C  Discontinued   OPHTHALMOLOGY EXAM  Discontinued    Health Maintenance  Health Maintenance Due  Topic Date Due   Zoster Vaccines-  Shingrix (1 of 2) Never done   TETANUS/TDAP  12/18/2017   COLONOSCOPY (Pts 45-469yrInsurance coverage will need to be confirmed)  06/27/2020   COVID-19 Vaccine (4 - Booster for PfAltoonaeries) 12/24/2020   INFLUENZA VACCINE  11/29/2020    Colorectal cancer screening: Type of screening: Colonoscopy. Completed scheduled 01/13/2021 [facility]. Repeat every 0 years  Lung Cancer Screening: (Low Dose CT Chest recommended if Age 469-80ears, 30 pack-year currently smoking OR have quit w/in 15years.) does not qualify.   Lung Cancer Screening Referral: n/a  Additional Screening:  Hepatitis C Screening: does not qualify; Completed 11-16/2017  Vision Screening: Recommended annual ophthalmology exams for early detection of glaucoma and other disorders of the eye. Is the patient up to date with their annual eye exam?  Yes  Who is the provider or what is the name of the office in which the patient attends annual eye exams? Dr.Omen If pt is not established with a provider, would they like to be referred to a provider to establish care? No .   Dental Screening: Recommended annual dental exams for proper oral hygiene  Community Resource Referral / Chronic Care Management: CRR required this visit?  No   CCM required this visit?  No      Plan:     I have personally reviewed and noted the following in the patient's chart:   Medical and social history Use of alcohol, tobacco or illicit drugs  Current medications and supplements including opioid prescriptions. Patient is not currently taking opioid prescriptions. Functional ability and status Nutritional status Physical activity Advanced directives List of other physicians Hospitalizations, surgeries, and ER visits in previous 12 months Vitals Screenings to include cognitive, depression, and falls Referrals and appointments  In addition, I have reviewed and discussed with patient certain preventive protocols, quality metrics, and best practice  recommendations. A written personalized care plan for preventive services as well as general preventive health recommendations were provided to patient.     Randel Pigg, LPN   [id number]   Nurse Notes: none

## 2020-12-24 NOTE — Chronic Care Management (AMB) (Signed)
    Chronic Care Management Pharmacy Assistant   Name: Kingjames Loadholt  MRN: [id number] DOB: [birth year]  Reason for Encounter: Patient submitted BP readings via e-mail.  12-15-20 93/52 (75 p) 12-16-20 115/68 (70 p) 12-17-20 107/63 (74 p) 12-18-20 116/63 (75 p) 12-19-20 106/57 (73 p) 12-20-20 106/62 (72 p) 12-21-20 116/61 (62 p) 12-22-20 108/62 (72 p) 12-23-20 108/63 (76 p)   Medications: Outpatient Encounter Medications as of 12/24/2020  Medication Sig   latanoprost (XALATAN) 0.005 % ophthalmic solution Place 1 drop into both eyes at bedtime.   latanoprost (XALATAN) 0.005 % ophthalmic solution Apply to eye.   olmesartan-hydrochlorothiazide (BENICAR HCT) 20-12.5 MG tablet Take 1 tablet by mouth once daily   omeprazole (PRILOSEC) 20 MG capsule Take 1 capsule by mouth daily as needed.   sildenafil (REVATIO) 20 MG tablet TAKE 2 TO 5 TABLETS BY MOUTH AS NEEDED   No facility-administered encounter medications on file as of 12/24/2020.    Care Gaps: AWV - Scheduled for 12-24-2020 (on today) CCM F/U Call - Scheduled for 03-14-21 12 pm Zoster Vaccine - Overdue TDAP - Overdue Colonoscopy - Overdue Flu Vaccine - Overdue  Star Rating Drugs: Olmesartan- HCTZ (Benicar HCT) 20-12.5 mg- Last filled 11-22-2020 90 DS at [facility] Pharmacist Assistant [phone]

## 2020-12-27 ENCOUNTER — Telehealth: Payer: Self-pay | Admitting: Pharmacist

## 2020-12-27 NOTE — Progress Notes (Signed)
This encounter was created in error - please disregard.

## 2020-12-29 NOTE — Progress Notes (Signed)
Patient left me a voicemail on today, stated he has also noticed that his pressures are lower than the office readings, he reports that he only has issues if he is out in the sun and overexerts himself then he might feel lightheaded, but this does not happen often and he feels fine otherwise, no dizziness or issues .

## 2020-12-30 ENCOUNTER — Ambulatory Visit (AMBULATORY_SURGERY_CENTER): Payer: Self-pay

## 2020-12-30 ENCOUNTER — Other Ambulatory Visit: Payer: Self-pay

## 2020-12-30 VITALS — Ht 72.5 in | Wt 215.6 lb

## 2020-12-30 DIAGNOSIS — Z86010 Personal history of colonic polyps: Secondary | ICD-10-CM

## 2020-12-30 NOTE — Progress Notes (Signed)
No egg or soy allergy known to patient  No issues with past sedation with any surgeries or procedures Patient denies ever being told they had issues or difficulty with intubation  No FH of Malignant Hyperthermia No diet pills per patient No home 02 use per patient  No blood thinners per patient  Pt denies issues with constipation  No A fib or A flutter  EMMI video to pt or via [employer] 19 guidelines implemented in [place] today with Pt and RN   Pt is fully vaccinated  for Covid   Miralax prep used  Due to the COVID-19 pandemic we are asking patients to follow certain guidelines.  Pt aware of COVID protocols and [facility] guidelines

## 2021-01-06 ENCOUNTER — Encounter: Payer: Self-pay | Admitting: Internal Medicine

## 2021-01-13 ENCOUNTER — Ambulatory Visit (AMBULATORY_SURGERY_CENTER): Payer: Medicare Other | Admitting: Internal Medicine

## 2021-01-13 ENCOUNTER — Encounter: Payer: Self-pay | Admitting: Internal Medicine

## 2021-01-13 ENCOUNTER — Other Ambulatory Visit: Payer: Self-pay

## 2021-01-13 VITALS — BP 120/64 | HR 54 | Temp 97.8°F | Resp 14 | Ht 72.5 in | Wt 215.6 lb

## 2021-01-13 DIAGNOSIS — D122 Benign neoplasm of ascending colon: Secondary | ICD-10-CM

## 2021-01-13 DIAGNOSIS — D125 Benign neoplasm of sigmoid colon: Secondary | ICD-10-CM

## 2021-01-13 DIAGNOSIS — D128 Benign neoplasm of rectum: Secondary | ICD-10-CM | POA: Diagnosis not present

## 2021-01-13 DIAGNOSIS — I10 Essential (primary) hypertension: Secondary | ICD-10-CM | POA: Diagnosis not present

## 2021-01-13 DIAGNOSIS — D123 Benign neoplasm of transverse colon: Secondary | ICD-10-CM | POA: Diagnosis not present

## 2021-01-13 DIAGNOSIS — Z86010 Personal history of colonic polyps: Secondary | ICD-10-CM | POA: Diagnosis not present

## 2021-01-13 DIAGNOSIS — D127 Benign neoplasm of rectosigmoid junction: Secondary | ICD-10-CM | POA: Diagnosis not present

## 2021-01-13 MED ORDER — SODIUM CHLORIDE 0.9 % IV SOLN
500.0000 mL | Freq: Once | INTRAVENOUS | Status: DC
Start: 2021-01-13 — End: 2021-01-13

## 2021-01-13 NOTE — Patient Instructions (Addendum)
I found and removed 5 tiny polyps today.  I will let you know pathology results and when to have another routine colonoscopy by mail and/or My Chart.  I appreciate the opportunity to care for you. Gatha Mayer, MD, [facility]   Handout on polyps and diverticulosis given to patient.  Await pathology results.  Repeat colonoscopy for surveillance will be determined based off of pathology results.   YOU HAD AN ENDOSCOPIC PROCEDURE TODAY AT [facility]:   Refer to the procedure report that was given to you for any specific questions about what was found during the examination.  If the procedure report does not answer your questions, please call your gastroenterologist to clarify.  If you requested that your care partner not be given the details of your procedure findings, then the procedure report has been included in a sealed envelope for you to review at your convenience later.  YOU SHOULD EXPECT: Some feelings of bloating in the abdomen. Passage of more gas than usual.  Walking can help get rid of the air that was put into your GI tract during the procedure and reduce the bloating. If you had a lower endoscopy (such as a colonoscopy or flexible sigmoidoscopy) you may notice spotting of blood in your stool or on the toilet paper. If you underwent a bowel prep for your procedure, you may not have a normal bowel movement for a few days.  Please Note:  You might notice some irritation and congestion in your nose or some drainage.  This is from the oxygen used during your procedure.  There is no need for concern and it should clear up in a day or so.  SYMPTOMS TO REPORT IMMEDIATELY:  Following lower endoscopy (colonoscopy or flexible sigmoidoscopy):  Excessive amounts of blood in the stool  Significant tenderness or worsening of abdominal pains  Swelling of the abdomen that is new, acute  Fever of 100F or higher   For urgent or emergent issues, a gastroenterologist can be reached  at any hour by calling [phone]. Do not use MyChart messaging for urgent concerns.    DIET:  We do recommend a small meal at first, but then you may proceed to your regular diet.  Drink plenty of fluids but you should avoid alcoholic beverages for 24 hours.  ACTIVITY:  You should plan to take it easy for the rest of today and you should NOT DRIVE or use heavy machinery until tomorrow (because of the sedation medicines used during the test).    FOLLOW UP: Our staff will call the number listed on your records 48-72 hours following your procedure to check on you and address any questions or concerns that you may have regarding the information given to you following your procedure. If we do not reach you, we will leave a message.  We will attempt to reach you two times.  During this call, we will ask if you have developed any symptoms of COVID 19. If you develop any symptoms (ie: fever, flu-like symptoms, shortness of breath, cough etc.) before then, please call [phone].  If you test positive for Covid 19 in the 2 weeks post procedure, please call and report this information to [place].    If any biopsies were taken you will be contacted by phone or by letter within the next 1-3 weeks.  Please call us at [phone] if you have not heard about the biopsies in 3 weeks.    SIGNATURES/CONFIDENTIALITY: You and/or your care  partner have signed paperwork which will be entered into your electronic medical record.  These signatures attest to the fact that that the information above on your After Visit Summary has been reviewed and is understood.  Full responsibility of the confidentiality of this discharge information lies with you and/or your care-partner.  

## 2021-01-13 NOTE — Progress Notes (Signed)
VS by CW  Pt's states no medical or surgical changes since previsit or office visit.  

## 2021-01-13 NOTE — Progress Notes (Signed)
PT taken to PACU. Monitors in place. VSS. Report given to RN. 

## 2021-01-13 NOTE — Progress Notes (Signed)
Vital signs manually entered due to a system malfunction. No values are transferring at this time. Reported to charge nurse.

## 2021-01-13 NOTE — Progress Notes (Signed)
[facility] History and Physical   Primary Care Physician:  [name], NP   Reason for Procedure:   Hx colon polyps  Plan:    colonoscopy     HPI: David Cannon is a 70 y.o. male here for surveillance colonoscopy.  2002 5 cm pedunculated adenoma 2006 no adenomas 2011 1 diminutive adenoma 06/28/2015 2 small transverse polyps removed adenomas Past Medical History:  Diagnosis Date   Diabetes mellitus without complication ([place])    Hyperlipidemia    Hypertension    Personal history of colonic polyps 03/08/2001    Past Surgical History:  Procedure Laterality Date   COLONOSCOPY W/ POLYPECTOMY      Prior to Admission medications   Medication Sig Start Date End Date Taking? Authorizing Provider  latanoprost (XALATAN) 0.005 % ophthalmic solution Apply to eye. 11/03/19  Yes [provider]  metFORMIN (GLUCOPHAGE) 500 MG tablet Take by mouth 2 (two) times daily with a meal.   Yes [provider]  olmesartan-hydrochlorothiazide (BENICAR HCT) 20-12.5 MG tablet Take 1 tablet by mouth once daily 11/22/20  Yes Nafziger, Tommi Rumps, NP  omeprazole (PRILOSEC) 20 MG capsule Take 1 capsule by mouth daily as needed. 12/24/14  Yes [provider]  sildenafil (REVATIO) 20 MG tablet TAKE 2 TO 5 TABLETS BY MOUTH AS NEEDED 01/07/20  Yes Nafziger, Tommi Rumps, NP    Current Outpatient Medications  Medication Sig Dispense Refill   latanoprost (XALATAN) 0.005 % ophthalmic solution Apply to eye.     metFORMIN (GLUCOPHAGE) 500 MG tablet Take by mouth 2 (two) times daily with a meal.     olmesartan-hydrochlorothiazide (BENICAR HCT) 20-12.5 MG tablet Take 1 tablet by mouth once daily 90 tablet 0   omeprazole (PRILOSEC) 20 MG capsule Take 1 capsule by mouth daily as needed.     sildenafil (REVATIO) 20 MG tablet TAKE 2 TO 5 TABLETS BY MOUTH AS NEEDED 90 tablet 1   Current Facility-Administered Medications  Medication Dose Route Frequency Provider Last Rate Last Admin   0.9 %   sodium chloride infusion  500 mL Intravenous Once Gatha Mayer, MD        Allergies as of 01/13/2021   (No Known Allergies)    Family History  Problem Relation Age of Onset   Lung cancer Mother        lung   Prostate cancer Father    Cancer Maternal Grandfather        colon   Colon cancer Maternal Grandfather    Coronary artery disease Neg Hx    Colon polyps Neg Hx    Rectal cancer Neg Hx    Stomach cancer Neg Hx    Esophageal cancer Neg Hx     Social History   Socioeconomic History   Marital status: Married    Spouse name: Not on file   Number of children: Not on file   Years of education: Not on file   Highest education level: Not on file  Occupational History   Not on file  Tobacco Use   Smoking status: Never    Passive exposure: Past (mother smoked in home when a child)   Smokeless tobacco: Never  Vaping Use   Vaping Use: Never used  Substance and Sexual Activity   Alcohol use: Yes    Comment: once every 3 months   Drug use: No   Sexual activity: Not on file  Other Topics Concern   Not on file  Social History Narrative   Retired from working at  the post office    Married    Daughter and step son   Three grand children       He likes to go to [employer] and spending time with his wife.       Social Determinants of Health   Financial Resource Strain: Low Risk    Difficulty of Paying Living Expenses: Not hard at all  Food Insecurity: No Food Insecurity   Worried About Charity fundraiser in the Last Year: Never true   [place] in the Last Year: Never true  Transportation Needs: No Transportation Needs   Lack of Transportation (Medical): No   Lack of Transportation (Non-Medical): No  Physical Activity: Insufficiently Active   Days of Exercise per Week: 3 days   Minutes of Exercise per Session: 30 min  Stress: No Stress Concern Present   Feeling of Stress : Not at all  Social Connections: Moderately Integrated   Frequency of Communication  with Friends and Family: Three times a week   Frequency of Social Gatherings with Friends and Family: Three times a week   Attends Religious Services: More than 4 times per year   Active Member of Clubs or Organizations: No   Attends Archivist Meetings: Never   Marital Status: Married  Human resources officer Violence: Not At Risk   Fear of Current or Ex-Partner: No   Emotionally Abused: No   Physically Abused: No   Sexually Abused: No    Review of Systems:  other review of systems negative except as mentioned in the HPI.  Physical Exam: Vital signs BP (!) 144/76   Pulse 60   Temp 97.8 F (36.6 C)   Ht 6' 0.5" (1.842 m)   Wt 215 lb 9.6 oz (97.8 kg)   SpO2 99%   BMI 28.84 kg/m   General:   Alert,  Well-developed, well-nourished, pleasant and cooperative in NAD Lungs:  Clear throughout to auscultation.   Heart:  Regular rate and rhythm; no murmurs, clicks, rubs,  or gallops. Abdomen:  Soft, nontender and nondistended. Normal bowel sounds.   Neuro/Psych:  Alert and cooperative. Normal mood and affect. A and O x 3   '@Esmond Hinch'$  Simonne Maffucci, MD, [facility] [phone] (pager) 01/13/2021 9:41 AM@

## 2021-01-13 NOTE — Op Note (Signed)
[place] Patient Name: David Cannon Procedure Date: 01/13/2021 9:32 AM MRN: [id number] Endoscopist: Gatha Mayer , MD Age: 70 Referring MD:  Date of Birth: 1950/05/21 Gender: Male Account #: [id number] Procedure:                Colonoscopy Indications:              Surveillance: Personal history of adenomatous                            polyps on last colonoscopy 5 years ago Medicines:                Propofol per Anesthesia, Monitored Anesthesia Care Procedure:                Pre-Anesthesia Assessment:                           - Prior to the procedure, a History and Physical                            was performed, and patient medications and                            allergies were reviewed. The patient's tolerance of                            previous anesthesia was also reviewed. The risks                            and benefits of the procedure and the sedation                            options and risks were discussed with the patient.                            All questions were answered, and informed consent                            was obtained. Prior Anticoagulants: The patient has                            taken no previous anticoagulant or antiplatelet                            agents. ASA Grade Assessment: II - A patient with                            mild systemic disease. After reviewing the risks                            and benefits, the patient was deemed in                            satisfactory condition to undergo the procedure.  After obtaining informed consent, the colonoscope                            was passed under direct vision. Throughout the                            procedure, the patient's blood pressure, pulse, and                            oxygen saturations were monitored continuously. The                            CF [id number] was introduced through the anus                             and advanced to the the cecum, identified by                            appendiceal orifice and ileocecal valve. The                            colonoscopy was performed without difficulty. The                            patient tolerated the procedure well. The quality                            of the bowel preparation was good. The bowel                            preparation used was Miralax via split dose                            instruction. The ileocecal valve, appendiceal                            orifice, and rectum were photographed. Scope In: 9:54:51 AM Scope Out: 10:14:24 AM Scope Withdrawal Time: 0 hours 17 minutes 9 seconds  Total Procedure Duration: 0 hours 19 minutes 33 seconds  Findings:                 The perianal and digital rectal examinations were                            normal. Pertinent negatives include normal prostate                            (size, shape, and consistency).                           Five sessile polyps were found in the rectum,                            sigmoid colon, transverse colon and ascending  colon. The polyps were diminutive in size. These                            polyps were removed with a cold snare. Resection                            and retrieval were complete. Verification of                            patient identification for the specimen was done.                            Estimated blood loss was minimal.                           Multiple small-mouthed diverticula were found in                            the sigmoid colon.                           The exam was otherwise without abnormality on                            direct and retroflexion views. Complications:            No immediate complications. Estimated Blood Loss:     Estimated blood loss was minimal. Impression:               - Five diminutive polyps in the rectum, in the                            sigmoid colon, in the  transverse colon and in the                            ascending colon, removed with a cold snare.                            Resected and retrieved.                           - Diverticulosis in the sigmoid colon.                           - The examination was otherwise normal on direct                            and retroflexion views.                           - Personal history of colonic polyps.                           2002 5 cm pedunculated adenoma  2006 no adenomas                           2011 1 diminutive adenoma                           06/28/2015 2 small transverse polyps removed adenomas Recommendation:           - Patient has a contact number available for                            emergencies. The signs and symptoms of potential                            delayed complications were discussed with the                            patient. Return to normal activities tomorrow.                            Written discharge instructions were provided to the                            patient.                           - Resume previous diet.                           - Continue present medications.                           - Repeat colonoscopy is recommended for                            surveillance. The colonoscopy date will be                            determined after pathology results from today's                            exam become available for review. Gatha Mayer, MD 01/13/2021 10:23:08 AM This report has been signed electronically.

## 2021-01-13 NOTE — Progress Notes (Signed)
Called to room to assist during endoscopic procedure.  Patient ID and intended procedure confirmed with present staff. Received instructions for my participation in the procedure from the performing physician.  

## 2021-01-17 ENCOUNTER — Telehealth: Payer: Self-pay | Admitting: *Deleted

## 2021-01-17 NOTE — Telephone Encounter (Signed)
  Follow up Call-  Call back number 01/13/2021  Post procedure Call Back phone  # [phone]  Permission to leave phone message Yes  Some recent data might be hidden     Patient questions:  Message left to call us if  necessary.

## 2021-01-17 NOTE — Telephone Encounter (Signed)
Attempted to completed post-procedure follow-up call. No answer. Left voicemail.

## 2021-01-21 ENCOUNTER — Encounter: Payer: Self-pay | Admitting: Internal Medicine

## 2021-02-02 ENCOUNTER — Other Ambulatory Visit: Payer: Self-pay | Admitting: Adult Health

## 2021-02-02 DIAGNOSIS — N529 Male erectile dysfunction, unspecified: Secondary | ICD-10-CM

## 2021-02-15 ENCOUNTER — Other Ambulatory Visit: Payer: Self-pay

## 2021-02-15 ENCOUNTER — Telehealth: Payer: Self-pay | Admitting: Adult Health

## 2021-02-15 DIAGNOSIS — N529 Male erectile dysfunction, unspecified: Secondary | ICD-10-CM

## 2021-02-15 MED ORDER — SILDENAFIL CITRATE 20 MG PO TABS: ORAL_TABLET | ORAL | 0 refills | Status: DC

## 2021-02-15 NOTE — Telephone Encounter (Signed)
Pt call and stated he want Tommi Rumps to give him a call  back and want a refill on sildenafil (REVATIO) 20 MG tablet  sent to  [facility], [place] Phone:  [phone]  Fax:  [phone]

## 2021-02-15 NOTE — Telephone Encounter (Signed)
Pt wanted the Rx changed from [facility] to [facility]. Rx has been switched over and filled for 90days as requested. Pt verbalized understanding.

## 2021-02-18 ENCOUNTER — Other Ambulatory Visit: Payer: Self-pay | Admitting: Adult Health

## 2021-02-18 DIAGNOSIS — I10 Essential (primary) hypertension: Secondary | ICD-10-CM

## 2021-02-22 ENCOUNTER — Other Ambulatory Visit: Payer: Self-pay | Admitting: Adult Health

## 2021-02-22 DIAGNOSIS — I10 Essential (primary) hypertension: Secondary | ICD-10-CM

## 2021-02-25 ENCOUNTER — Other Ambulatory Visit (INDEPENDENT_AMBULATORY_CARE_PROVIDER_SITE_OTHER): Payer: Medicare Other

## 2021-02-25 DIAGNOSIS — N289 Disorder of kidney and ureter, unspecified: Secondary | ICD-10-CM

## 2021-02-25 LAB — BASIC METABOLIC PANEL
BUN: 21 mg/dL (ref 6–23)
CO2: 29 mEq/L (ref 19–32)
Calcium: 9.4 mg/dL (ref 8.4–10.5)
Chloride: 105 mEq/L (ref 96–112)
Creatinine, Ser: 1.39 mg/dL (ref 0.40–1.50)
GFR: 50.7 mL/min — ABNORMAL LOW (ref >60.00)
Glucose, Bld: 105 mg/dL — ABNORMAL HIGH (ref 70–99)
Potassium: 4.2 mEq/L (ref 3.5–5.1)
Sodium: 141 mEq/L (ref 135–145)

## 2021-03-01 ENCOUNTER — Other Ambulatory Visit: Payer: Medicare Other

## 2021-03-08 ENCOUNTER — Telehealth: Payer: Self-pay | Admitting: Adult Health

## 2021-03-08 NOTE — Telephone Encounter (Signed)
Patient called to get new prescription for metFORMIN (GLUCOPHAGE) 500 MG tablet Patient states [facility] no longer has prescription on file and wants new prescription to be sent to  [place], [place] Phone:  [phone]  Fax:  [phone]     Patient would also like to know if he needs an appointment to get new prescription for because if so he stated he would find other alternatives   Good callback number is [phone]  Please advise

## 2021-03-09 ENCOUNTER — Other Ambulatory Visit: Payer: Self-pay

## 2021-03-09 MED ORDER — METFORMIN HCL 500 MG PO TABS: 500.0000 mg | ORAL_TABLET | Freq: Two times a day (BID) | ORAL | 0 refills | Status: DC

## 2021-03-09 NOTE — Telephone Encounter (Signed)
Rx sent electronically to [facility]. Patient notified of update  and verbalized understanding.

## 2021-03-11 ENCOUNTER — Telehealth: Payer: Self-pay | Admitting: Pharmacist

## 2021-03-11 NOTE — Chronic Care Management (AMB) (Signed)
    Chronic Care Management Pharmacy Assistant   Name: David Cannon  MRN: [id number] DOB: [birth year]  03/11/21 APPOINTMENT REMINDER   Called Patient No answer, left message of appointment on 11/14 at 12 via telephone visit with [name], Pharm D.   Notified to have all medications, supplements, blood pressure and/or blood sugar logs available during appointment and to return call if need to reschedule   Care Gaps: AWV - 8/22 Zoster Vaccines - Overdue COVID Booster #4 Pfizer- Overdue Flu Vaccine - Overdue BP- 120/80 (11/03/20) Lab Results  Component Value Date   HGBA1C 6.4 11/03/2020    Star Rating Drug: Olmesartan - HCTZ (Benicar HCT) 20-12.5 mg - Last filled 02/22/21 90 DS ar [facility] Metformin (Glucophage) 500 mg - Last filled 03/09/21 45 DS at [facility]  Any gaps in medications fill history? None   SIG:   Medications: Outpatient Encounter Medications as of 03/11/2021  Medication Sig   latanoprost (XALATAN) 0.005 % ophthalmic solution Apply to eye.   metFORMIN (GLUCOPHAGE) 500 MG tablet Take 1 tablet (500 mg total) by mouth 2 (two) times daily with a meal.   olmesartan-hydrochlorothiazide (BENICAR HCT) 20-12.5 MG tablet Take 1 tablet by mouth once daily   omeprazole (PRILOSEC) 20 MG capsule Take 1 capsule by mouth daily as needed.   sildenafil (REVATIO) 20 MG tablet Take 2 to 5 tablets as needed   No facility-administered encounter medications on file as of 03/11/2021.    [place] Clinical Pharmacist Assistant [phone]

## 2021-03-14 ENCOUNTER — Ambulatory Visit (INDEPENDENT_AMBULATORY_CARE_PROVIDER_SITE_OTHER): Payer: Medicare Other | Admitting: Pharmacist

## 2021-03-14 DIAGNOSIS — I10 Essential (primary) hypertension: Secondary | ICD-10-CM

## 2021-03-14 DIAGNOSIS — H401131 Primary open-angle glaucoma, bilateral, mild stage: Secondary | ICD-10-CM | POA: Diagnosis not present

## 2021-03-14 DIAGNOSIS — E785 Hyperlipidemia, unspecified: Secondary | ICD-10-CM

## 2021-03-14 NOTE — Patient Instructions (Signed)
Hi Clay,  It was great to catch up with you again! I will let you know what Tommi Rumps says once I have a chance to talk to him. Keep on checking your blood pressure and blood sugars at home and don't forget to bring your blood pressure cuff to your next office visit to make sure it's accurate.  Please reach out to me if you have any questions or need anything!  Best, Maddie  [name], PharmD, [place] Pharmacist [facility]   Visit Information   Goals Addressed   None    Patient Care Plan: CCM Pharmacy Care Plan     Problem Identified: Problem: Hypertension, Hyperlipidemia, Diabetes and ED      Long-Range Goal: Patient-Specific Goal   Start Date: 09/13/2020  Expected End Date: 09/13/2021  Recent Progress: On track  Priority: High  Note:   Current Barriers:  Unable to independently monitor therapeutic efficacy  Pharmacist Clinical Goal(s):  Patient will achieve adherence to monitoring guidelines and medication adherence to achieve therapeutic efficacy through collaboration with PharmD and provider.   Interventions: 1:1 collaboration with [name], NP regarding development and update of comprehensive plan of care as evidenced by provider attestation and co-signature Inter-disciplinary care team collaboration (see longitudinal plan of care) Comprehensive medication review performed; medication list updated in electronic medical record  Hypertension (BP goal <140/90) -Not ideally controlled -Current treatment: olmesartan-hydrochlorothiazide 20-12.5mg , 1 tablet daily  -Medications previously tried: none  -Current home readings: 115-120s (checking a couple times a week - just got a BP cuff from the [place]; arm cuff) -Current dietary habits: did not discuss -Current exercise habits: did not discuss -Denies hypotensive/hypertensive symptoms -Educated on Exercise goal of 150 minutes per week; Importance of home blood pressure  monitoring; Proper BP monitoring technique; -Counseled to monitor BP at home weekly, document, and provide log at future appointments -Counseled on diet and exercise extensively Recommended to continue current medication  Hyperlipidemia: (LDL goal < 70) -Uncontrolled -Current treatment: No medications -Medications previously tried: simvastatin (unclear why it was stopped and patient does not remember taking it)  -Current dietary patterns: did not discuss -Current exercise habits: did not discuss -Educated on Cholesterol goals;  Benefits of statin for ASCVD risk reduction; Importance of limiting foods high in cholesterol; Exercise goal of 150 minutes per week; -Counseled on diet and exercise extensively Recommended statin therapy based on ASCVD risk  Diabetes (A1c goal <7%) -Controlled -Current medications: Metformin 500 mg 1 tablet daily -Medications previously tried: pioglitazone (vision impairment), sitagliptin   -Current home glucose readings fasting glucose: 101, 100, 105, 106, 102, 100, 99 post prandial glucose: does not check -Denies hypoglycemic/hyperglycemic symptoms -Current meal patterns:  breakfast: did not discuss  lunch: did not discuss  dinner: did not discuss snacks: been trying to avoid sweets drinks: did not discuss -Current exercise: been doing "too much" yardwork; walking with wife 2-3 times a week -Educated on A1c and blood sugar goals; Exercise goal of 150 minutes per week; Carbohydrate counting and/or plate method -Counseled to check feet daily and get yearly eye exams -Counseled on diet and exercise extensively  ED (Goal: minimize symptoms) -Controlled -Current treatment  Sildenafil 20mg , 2 to 5 tablets as needed -Medications previously tried: none  -Recommended to continue current medication  Health Maintenance -Vaccine gaps: shingrix, tetanus -Current therapy:  No medications -Educated on Cost vs benefit of each product must be carefully  weighed by individual consumer -Patient is satisfied with current therapy and denies issues -Recommended to  continue current medication  Patient Goals/Self-Care Activities Patient will:  - take medications as prescribed check blood pressure weekly, document, and provide at future appointments target a minimum of 150 minutes of moderate intensity exercise weekly engage in dietary modifications by adding more fiber to his diet to lower cholesterol  Follow Up Plan: Telephone follow up appointment with care management team member scheduled for: 6 months       Patient verbalizes understanding of instructions provided today and agrees to view in [place].  The pharmacy team will reach out to the patient again over the next 30 days.   Viona Gilmore, [facility]

## 2021-03-14 NOTE — Progress Notes (Signed)
Chronic Care Management Pharmacy Note  03/14/2021 Name:  David Cannon MRN:  [id number] DOB:  02-28-1951  Summary: BP at goal < 140/90 A1c at goal < 7%  Recommendations/Changes made from today's visit: -Recommend statin therapy based on ASCVD risk > 20% -Clarified with PCP about metformin directions as most recent rx was sent in as twice daily -Recommended bringing BP cuff to next office visit to ensure accuracy  Plan: Follow up BP assessment in 3-4 months  Mailed healthy plate handout  Subjective: David Cannon is an 70 y.o. year old male who is a primary patient of [name], NP.  The CCM team was consulted for assistance with disease management and care coordination needs.    Engaged with patient by telephone for follow up visit in response to provider referral for pharmacy case management and/or care coordination services.   Consent to Services:  The patient was given information about Chronic Care Management services, agreed to services, and gave verbal consent prior to initiation of services.  Please see initial visit note for detailed documentation.   Patient Care Team: [name], NP as PCP - General (Family Medicine) Viona Gilmore, [facility] as Pharmacist (Pharmacist)  Recent office visits: 12/24/20 Randel Pigg, LPN: Patient presented for AWV.  11/03/20 [name], NP - Patient presented for routine general exam and other concerns. Stopped Benzonatate 200 mg. Recommended metformin daily.  Recent consult visits: 01/31/21 Brion Aliment (optometry with the [facility]): Patient presented for eye exam.  01/13/21 Patient presented for colonoscopy.  12/13/20 [facility]- Patient presented for immunizations. No medication changes.   Hospital visits: None in previous 6 months  Objective:  Lab Results  Component Value Date   CREATININE 1.39 02/25/2021   BUN 21 02/25/2021   GFR 50.70 (L) 02/25/2021   GFRNONAA 69.07 12/10/2009   GFRAA 67 12/26/2007   NA 141  02/25/2021   K 4.2 02/25/2021   CALCIUM 9.4 02/25/2021   CO2 29 02/25/2021   GLUCOSE 105 (H) 02/25/2021    Lab Results  Component Value Date/Time   HGBA1C 6.4 11/03/2020 10:04 AM   HGBA1C 5.5 01/07/2020 08:37 AM   HGBA1C 5.6 08/28/2019 09:30 AM   HGBA1C 6.7 (H) 05/23/2019 10:15 AM   GFR 50.70 (L) 02/25/2021 07:47 AM   GFR 49.11 (L) 11/03/2020 10:04 AM   MICROALBUR 0.9 08/21/2013 08:47 AM   MICROALBUR 0.7 12/29/2011 09:00 AM    Last diabetic Eye exam:  Lab Results  Component Value Date/Time   HMDIABEYEEXA No Retinopathy 10/28/2019 12:00 AM    Last diabetic Foot exam: No results found for: HMDIABFOOTEX   Lab Results  Component Value Date   CHOL 167 11/03/2020   HDL 39.10 11/03/2020   LDLCALC 105 (H) 11/03/2020   TRIG 115.0 11/03/2020   CHOLHDL 4 11/03/2020    Hepatic Function Latest Ref Rng & Units 11/03/2020 05/23/2019 05/16/2018  Total Protein 6.0 - 8.3 g/dL 7.2 7.2 7.4  Albumin 3.5 - 5.2 g/dL 4.3 4.3 4.4  AST 0 - 37 U/L '15 21 24  ' ALT 0 - 53 U/L '18 23 26  ' Alk Phosphatase 39 - 117 U/L 43 48 45  Total Bilirubin 0.2 - 1.2 mg/dL 0.5 0.5 0.5  Bilirubin, Direct 0.0 - 0.3 mg/dL - - 0.1    Lab Results  Component Value Date/Time   TSH 1.28 11/03/2020 10:04 AM   TSH 1.08 05/23/2019 10:15 AM    CBC Latest Ref Rng & Units 11/03/2020 05/23/2019 05/16/2018  WBC 4.0 - 10.5 K/uL  5.0 5.7 5.7  Hemoglobin 13.0 - 17.0 g/dL 13.5 13.5 13.4  Hematocrit 39.0 - 52.0 % 41.5 42.7 41.2  Platelets 150.0 - 400.0 K/uL 192.0 199.0 224.0    No results found for: VD25OH  Clinical ASCVD: No  The 10-year ASCVD risk score (Arnett DK, et al., 2019) is: 49%   Values used to calculate the score:     Age: 70 years     Sex: Male     Is Non-Hispanic African American: Yes     Diabetic: Yes     Tobacco smoker: Yes     Systolic Blood Pressure: 883 mmHg     Is BP treated: Yes     HDL Cholesterol: 39.1 mg/dL     Total Cholesterol: 167 mg/dL    Depression screen [facility] 2/9 12/24/2020 01/12/2020 01/07/2020   Decreased Interest 0 0 0  Down, Depressed, Hopeless 0 0 0  PHQ - 2 Score 0 0 0  Altered sleeping - 0 -  Tired, decreased energy - 0 -  Change in appetite - 0 -  Feeling bad or failure about yourself  - 0 -  Trouble concentrating - 0 -  Moving slowly or fidgety/restless - 0 -  Suicidal thoughts - 0 -  PHQ-9 Score - 0 -  Difficult doing work/chores - Not difficult at all -     Social History   Tobacco Use  Smoking Status Never   Passive exposure: Past (mother smoked in home when a child)  Smokeless Tobacco Never   BP Readings from Last 3 Encounters:  01/13/21 120/64  12/24/20 132/72  11/03/20 120/80   Pulse Readings from Last 3 Encounters:  01/13/21 (!) 54  12/24/20 74  11/03/20 93   Wt Readings from Last 3 Encounters:  01/13/21 215 lb 9.6 oz (97.8 kg)  12/30/20 215 lb 9.6 oz (97.8 kg)  12/24/20 214 lb (97.1 kg)   BMI Readings from Last 3 Encounters:  01/13/21 28.84 kg/m  12/30/20 28.84 kg/m  12/24/20 29.02 kg/m    Assessment/Interventions: Review of patient past medical history, allergies, medications, health status, including review of consultants reports, laboratory and other test data, was performed as part of comprehensive evaluation and provision of chronic care management services.   SDOH:  (Social Determinants of Health) assessments and interventions performed: No  SDOH Screenings   Alcohol Screen: Low Risk    Last Alcohol Screening Score (AUDIT): 0  Depression (PHQ2-9): Low Risk    PHQ-2 Score: 0  Financial Resource Strain: Low Risk    Difficulty of Paying Living Expenses: Not hard at all  Food Insecurity: No Food Insecurity   Worried About Charity fundraiser in the Last Year: Never true   Ran Out of Food in the Last Year: Never true  Housing: Low Risk    Last Housing Risk Score: 0  Physical Activity: Insufficiently Active   Days of Exercise per Week: 3 days   Minutes of Exercise per Session: 30 min  Social Connections: Moderately Integrated    Frequency of Communication with Friends and Family: Three times a week   Frequency of Social Gatherings with Friends and Family: Three times a week   Attends Religious Services: More than 4 times per year   Active Member of Clubs or Organizations: No   Attends Archivist Meetings: Never   Marital Status: Married  Stress: No Stress Concern Present   Feeling of Stress : Not at all  Tobacco Use: Low Risk    Smoking  Tobacco Use: Never   Smokeless Tobacco Use: Never   Passive Exposure: Past  Transportation Needs: No Transportation Needs   Lack of Transportation (Medical): No   Lack of Transportation (Non-Medical): No    CCM Care Plan  No Known Allergies  Medications Reviewed Today     Reviewed by Gatha Mayer, MD (Physician) on 01/13/21 at 0941  Med List Status: <None>   Medication Order Taking? Sig Documenting Provider Last Dose Status Informant  0.9 %  sodium chloride infusion [id number]   Gatha Mayer, MD  Active   latanoprost (XALATAN) 0.005 % ophthalmic solution [id number] Yes Apply to eye. [provider] 01/12/2021 Active   metFORMIN (GLUCOPHAGE) 500 MG tablet [id number] Yes Take by mouth 2 (two) times daily with a meal. [provider] 01/12/2021 Active   olmesartan-hydrochlorothiazide (BENICAR HCT) 20-12.5 MG tablet [id number] Yes Take 1 tablet by mouth once daily [name], NP 01/12/2021 Active   omeprazole (PRILOSEC) 20 MG capsule [id number] Yes Take 1 capsule by mouth daily as needed. [provider] 01/12/2021 Active   sildenafil (REVATIO) 20 MG tablet [id number] Yes TAKE 2 TO 5 TABLETS BY MOUTH AS NEEDED [name], NP 01/12/2021 Active             Patient Active Problem List   Diagnosis Date Noted   Borderline diabetic 11/03/2020   Preventative health care 08/27/2013   Erectile dysfunction 03/03/2011   Hyperlipidemia 06/18/2009   Essential hypertension 12/19/2007   Personal history of colonic polyps 03/08/2001     Immunization History  Administered Date(s) Administered   DTP 10/30/2002   Influenza Split 03/02/2011   Influenza Whole 03/18/2007, 05/07/2009   Influenza, High Dose Seasonal PF 01/17/2018, 01/30/2020, 01/05/2021   Influenza,inj,Quad PF,6+ Mos 01/12/2016, 12/31/2018   Influenza-Unspecified 02/22/2014   PFIZER(Purple Top)SARS-COV-2 Vaccination 05/20/2019, 06/10/2019, 08/24/2020   Pneumococcal Conjugate-13 01/12/2016   Pneumococcal Polysaccharide-23 01/13/2013, 07/15/2020   Td 12/19/2007   Tdap 12/19/2007, 11/09/2020   Zoster, Live 03/16/2016    Conditions to be addressed/monitored:  Hypertension, Hyperlipidemia, Diabetes and ED  Conditions addressed this visit: Hypertension, diabetes, hyperlipidemia  Care Plan : [place]  Updates made by Viona Gilmore, [place] since 03/14/2021 12:00 AM     Problem: Problem: Hypertension, Hyperlipidemia, Diabetes and ED      Long-Range Goal: Patient-Specific Goal   Start Date: 09/13/2020  Expected End Date: 09/13/2021  Recent Progress: On track  Priority: High  Note:   Current Barriers:  Unable to independently monitor therapeutic efficacy  Pharmacist Clinical Goal(s):  Patient will achieve adherence to monitoring guidelines and medication adherence to achieve therapeutic efficacy through collaboration with PharmD and provider.   Interventions: 1:1 collaboration with [name], NP regarding development and update of comprehensive plan of care as evidenced by provider attestation and co-signature Inter-disciplinary care team collaboration (see longitudinal plan of care) Comprehensive medication review performed; medication list updated in electronic medical record  Hypertension (BP goal <140/90) -Not ideally controlled -Current treatment: olmesartan-hydrochlorothiazide 20-12.63m, 1 tablet daily  -Medications previously tried: none  -Current home readings: 115-120s (checking a couple times a week - [place]uff [place] -Current dietary habits: did not discuss -Current exercise habits: did not discuss -Denies hypotensive/hypertensive symptoms -Educated on Exercise goal of 150 minutes per week; Importance of home blood pressure monitoring; Proper BP monitoring technique; -Counseled to monitor BP at home weekly, document, and provide log at future appointments -Counseled on diet and exercise extensively Recommended to continue  current medication  Hyperlipidemia: (LDL goal < 70) -Uncontrolled -Current treatment: No medications -Medications previously tried: simvastatin (unclear why it was stopped and patient does not remember taking it)  -Current dietary patterns: did not discuss -Current exercise habits: did not discuss -Educated on Cholesterol goals;  Benefits of statin for ASCVD risk reduction; Importance of limiting foods high in cholesterol; Exercise goal of 150 minutes per week; -Counseled on diet and exercise extensively Recommended statin therapy based on ASCVD risk  Diabetes (A1c goal <7%) -Controlled -Current medications: Metformin 500 mg 1 tablet daily -Medications previously tried: pioglitazone (vision impairment), sitagliptin   -Current home glucose readings fasting glucose: 101, 100, 105, 106, 102, 100, 99 post prandial glucose: does not check -Denies hypoglycemic/hyperglycemic symptoms -Current meal patterns:  breakfast: did not discuss  lunch: did not discuss  dinner: did not discuss snacks: been trying to avoid sweets drinks: did not discuss -Current exercise: been doing "too much" yardwork; walking with wife 2-3 times a week -Educated on A1c and blood sugar goals; Exercise goal of 150 minutes per week; Carbohydrate counting and/or plate method -Counseled to check feet daily and get yearly eye exams -Counseled on diet and exercise extensively  ED (Goal: minimize symptoms) -Controlled -Current treatment  Sildenafil 71m, 2 to 5 tablets as  needed -Medications previously tried: none  -Recommended to continue current medication  Health Maintenance -Vaccine gaps: shingrix, tetanus -Current therapy:  No medications -Educated on Cost vs benefit of each product must be carefully weighed by individual consumer -Patient is satisfied with current therapy and denies issues -Recommended to continue current medication  Patient Goals/Self-Care Activities Patient will:  - take medications as prescribed check blood pressure weekly, document, and provide at future appointments target a minimum of 150 minutes of moderate intensity exercise weekly engage in dietary modifications by adding more fiber to his diet to lower cholesterol  Follow Up Plan: Telephone follow up appointment with care management team member scheduled for: 6 months       Medication Assistance: None required.  Patient affirms current coverage meets needs.  Compliance/Adherence/Medication fill history: Care Gaps: Shingrix, COVID booster Last A1c: 6.4% Last BP: 120/64  Star-Rating Drugs: Olmesartan - HCTZ (Benicar HCT) 20-12.5 mg - Last filled 02/22/21 90 DS at [facility] Metformin (Glucophage) 500 mg - [facility] [id number] [place]Phone: [phone]: [phone] [place] [address] [place] [place]Phone: [phone]: [phone] Uses pill box? No - has own system Pt endorses 100% compliance  We discussed: Current pharmacy is preferred with insurance plan and patient is satisfied with pharmacy services Patient decided to: Continue current medication management strategy  Care Plan and Follow Up Patient Decision:  Patient agrees to Care Plan and Follow-up.  Plan: The care management team will reach out to the patient  again over the next 30 days.  MJeni Salles PharmD BOptima Specialty HospitalClinical Pharmacist [place]at [place]

## 2021-03-15 ENCOUNTER — Other Ambulatory Visit: Payer: Self-pay | Admitting: Adult Health

## 2021-03-15 MED ORDER — SIMVASTATIN 10 MG PO TABS: 10.0000 mg | ORAL_TABLET | Freq: Every day | ORAL | 3 refills | Status: DC

## 2021-03-21 ENCOUNTER — Telehealth: Payer: Self-pay | Admitting: Pharmacist

## 2021-03-21 NOTE — Telephone Encounter (Signed)
Pt is returning maddie call

## 2021-03-21 NOTE — Telephone Encounter (Signed)
Called patient back about medication changes. Patient is aware to take metformin once daily only. Will route to PCP to send in an updated prescription.  Patient already started taking simvastatin 10 mg daily. He denies any side effects or concerns at this time.

## 2021-03-21 NOTE — Telephone Encounter (Signed)
Called patient to follow up on changes from CCM visit last week. Left voicemail requesting a call back.

## 2021-03-22 ENCOUNTER — Other Ambulatory Visit: Payer: Self-pay | Admitting: Adult Health

## 2021-03-22 MED ORDER — METFORMIN HCL 500 MG PO TABS: 500.0000 mg | ORAL_TABLET | Freq: Every day | ORAL | 0 refills | Status: DC

## 2021-03-30 DIAGNOSIS — I10 Essential (primary) hypertension: Secondary | ICD-10-CM

## 2021-03-30 DIAGNOSIS — E785 Hyperlipidemia, unspecified: Secondary | ICD-10-CM | POA: Diagnosis not present

## 2021-06-06 ENCOUNTER — Other Ambulatory Visit: Payer: Self-pay | Admitting: Adult Health

## 2021-06-06 DIAGNOSIS — I10 Essential (primary) hypertension: Secondary | ICD-10-CM

## 2021-06-22 DIAGNOSIS — H401131 Primary open-angle glaucoma, bilateral, mild stage: Secondary | ICD-10-CM | POA: Diagnosis not present

## 2021-08-17 ENCOUNTER — Telehealth: Payer: Self-pay | Admitting: Adult Health

## 2021-08-17 DIAGNOSIS — N529 Male erectile dysfunction, unspecified: Secondary | ICD-10-CM

## 2021-08-17 NOTE — Telephone Encounter (Signed)
This has been taking care of.

## 2021-08-17 NOTE — Telephone Encounter (Signed)
Patient called because he needs a refill on his sildenafil (REVATIO) 20 MG tablet ? ? ? ?Patient is out and will be leaving to go out of town early in the morning. ? ? ? ? ? ?Please send to ?[place], [place] Phone:  [phone]  ?Fax:  [phone]  ?  ? ? ? ? ? ?Please advise  ?

## 2021-08-31 ENCOUNTER — Other Ambulatory Visit: Payer: Self-pay | Admitting: Adult Health

## 2021-08-31 DIAGNOSIS — I10 Essential (primary) hypertension: Secondary | ICD-10-CM

## 2021-09-04 ENCOUNTER — Other Ambulatory Visit: Payer: Self-pay | Admitting: Adult Health

## 2021-09-11 ENCOUNTER — Other Ambulatory Visit: Payer: Self-pay | Admitting: Adult Health

## 2021-09-11 DIAGNOSIS — N529 Male erectile dysfunction, unspecified: Secondary | ICD-10-CM

## 2021-09-14 DIAGNOSIS — H401131 Primary open-angle glaucoma, bilateral, mild stage: Secondary | ICD-10-CM | POA: Diagnosis not present

## 2021-09-16 ENCOUNTER — Telehealth: Payer: Medicare Other

## 2021-09-27 ENCOUNTER — Ambulatory Visit (INDEPENDENT_AMBULATORY_CARE_PROVIDER_SITE_OTHER): Payer: Medicare Other | Admitting: Adult Health

## 2021-09-27 VITALS — BP 112/68 | HR 65 | Temp 98.4°F | Wt 213.6 lb

## 2021-09-27 DIAGNOSIS — T148XXA Other injury of unspecified body region, initial encounter: Secondary | ICD-10-CM | POA: Diagnosis not present

## 2021-09-27 NOTE — Progress Notes (Signed)
Subjective:    Patient ID: David Cannon, male    DOB: [birth year], 71 y.o.   MRN: [id number]  HPI  71 year old male who  has a past medical history of Diabetes mellitus without complication ([place]), Hyperlipidemia, Hypertension, and Personal history of colonic polyps (03/08/2001).  He presents to the office today for right upper arm/shoulder pain that has been present for roughly one month.  Pain seems to be aggravated when he sleeps, but he is sleeping on his left shoulder.  He does not have discomfort during the day or with typical range of motion.  At home he has been using an anti-inflammatory cream which seems to help for short duration.    Review of Systems See HPI   Past Medical History:  Diagnosis Date   Diabetes mellitus without complication ([place])    Hyperlipidemia    Hypertension    Personal history of colonic polyps 03/08/2001    Social History   Socioeconomic History   Marital status: Married    Spouse name: Not on file   Number of children: Not on file   Years of education: Not on file   Highest education level: Not on file  Occupational History   Not on file  Tobacco Use   Smoking status: Never    Passive exposure: Past (mother smoked in home when a child)   Smokeless tobacco: Never  Vaping Use   Vaping Use: Never used  Substance and Sexual Activity   Alcohol use: Yes    Comment: once every 3 months   Drug use: No   Sexual activity: Not on file  Other Topics Concern   Not on file  Social History Narrative   Retired from working at the post [employer]    Married    Daughter and step son   Three grand children       He likes to go to [employer] and spending time with his wife.       Social Determinants of Health   Financial Resource Strain: Low Risk    Difficulty of Paying Living Expenses: Not hard at all  Food Insecurity: No Food Insecurity   Worried About Charity fundraiser in the Last Year: Never true   [place] in the Last Year: Never  true  Transportation Needs: No Transportation Needs   Lack of Transportation (Medical): No   Lack of Transportation (Non-Medical): No  Physical Activity: Insufficiently Active   Days of Exercise per Week: 3 days   Minutes of Exercise per Session: 30 min  Stress: No Stress Concern Present   Feeling of Stress : Not at all  Social Connections: Moderately Integrated   Frequency of Communication with Friends and Family: Three times a week   Frequency of Social Gatherings with Friends and Family: Three times a week   Attends Religious Services: More than 4 times per year   Active Member of Clubs or Organizations: No   Attends Archivist Meetings: Never   Marital Status: Married  Human resources officer Violence: Not At Risk   Fear of Current or Ex-Partner: No   Emotionally Abused: No   Physically Abused: No   Sexually Abused: No    Past Surgical History:  Procedure Laterality Date   COLONOSCOPY W/ POLYPECTOMY      Family History  Problem Relation Age of Onset   Lung cancer Mother        lung   Prostate cancer Father    Cancer  Maternal Grandfather        colon   Colon cancer Maternal Grandfather    Coronary artery disease Neg Hx    Colon polyps Neg Hx    Rectal cancer Neg Hx    Stomach cancer Neg Hx    Esophageal cancer Neg Hx     No Known Allergies  Current Outpatient Medications on File Prior to Visit  Medication Sig Dispense Refill   latanoprost (XALATAN) 0.005 % ophthalmic solution Apply to eye.     metFORMIN (GLUCOPHAGE) 500 MG tablet Take 1 tablet by mouth once daily with breakfast 90 tablet 0   olmesartan-hydrochlorothiazide (BENICAR HCT) 20-12.5 MG tablet Take 1 tablet by mouth once daily 90 tablet 0   omeprazole (PRILOSEC) 20 MG capsule Take 1 capsule by mouth daily as needed.     sildenafil (REVATIO) 20 MG tablet TAKE 2 TO 5 TABLETS BY MOUTH AS NEEDED 90 tablet 0   simvastatin (ZOCOR) 10 MG tablet Take 1 tablet (10 mg total) by mouth at bedtime. 90 tablet 3    No current facility-administered medications on file prior to visit.    BP 112/68 (BP Location: Right Arm, Patient Position: Sitting, Cuff Size: Normal)   Pulse 65   Temp 98.4 F (36.9 C) (Oral)   Wt 213 lb 9.6 oz (96.9 kg)   SpO2 95%   BMI 28.57 kg/m       Objective:   Physical Exam Vitals and nursing note reviewed.  Constitutional:      Appearance: Normal appearance.  Musculoskeletal:        General: Tenderness present. Normal range of motion.     Right shoulder: No tenderness, bony tenderness or crepitus. Normal range of motion.       Arms:  Skin:    General: Skin is warm and dry.     Capillary Refill: Capillary refill takes less than 2 seconds.  Neurological:     General: No focal deficit present.     Mental Status: He is alert and oriented to person, place, and time.  Psychiatric:        Mood and Affect: Mood normal.        Behavior: Behavior normal.        Thought Content: Thought content normal.        Judgment: Judgment normal.      Assessment & Plan:  1. Muscle strain - seems to be muscle in origin. - Advised heating pad and stretching exercises.  - Does not want medication at this time  - Follow up as needed  [name], NP

## 2021-10-17 DIAGNOSIS — M9905 Segmental and somatic dysfunction of pelvic region: Secondary | ICD-10-CM | POA: Diagnosis not present

## 2021-10-17 DIAGNOSIS — M9903 Segmental and somatic dysfunction of lumbar region: Secondary | ICD-10-CM | POA: Diagnosis not present

## 2021-10-17 DIAGNOSIS — M9901 Segmental and somatic dysfunction of cervical region: Secondary | ICD-10-CM | POA: Diagnosis not present

## 2021-10-17 DIAGNOSIS — M9904 Segmental and somatic dysfunction of sacral region: Secondary | ICD-10-CM | POA: Diagnosis not present

## 2021-10-19 DIAGNOSIS — M9905 Segmental and somatic dysfunction of pelvic region: Secondary | ICD-10-CM | POA: Diagnosis not present

## 2021-10-19 DIAGNOSIS — M9903 Segmental and somatic dysfunction of lumbar region: Secondary | ICD-10-CM | POA: Diagnosis not present

## 2021-10-19 DIAGNOSIS — M9904 Segmental and somatic dysfunction of sacral region: Secondary | ICD-10-CM | POA: Diagnosis not present

## 2021-10-19 DIAGNOSIS — M9901 Segmental and somatic dysfunction of cervical region: Secondary | ICD-10-CM | POA: Diagnosis not present

## 2021-10-21 DIAGNOSIS — M9901 Segmental and somatic dysfunction of cervical region: Secondary | ICD-10-CM | POA: Diagnosis not present

## 2021-10-21 DIAGNOSIS — M9905 Segmental and somatic dysfunction of pelvic region: Secondary | ICD-10-CM | POA: Diagnosis not present

## 2021-10-21 DIAGNOSIS — M9904 Segmental and somatic dysfunction of sacral region: Secondary | ICD-10-CM | POA: Diagnosis not present

## 2021-10-21 DIAGNOSIS — M9903 Segmental and somatic dysfunction of lumbar region: Secondary | ICD-10-CM | POA: Diagnosis not present

## 2021-10-24 DIAGNOSIS — M9903 Segmental and somatic dysfunction of lumbar region: Secondary | ICD-10-CM | POA: Diagnosis not present

## 2021-10-24 DIAGNOSIS — M9904 Segmental and somatic dysfunction of sacral region: Secondary | ICD-10-CM | POA: Diagnosis not present

## 2021-10-24 DIAGNOSIS — M9905 Segmental and somatic dysfunction of pelvic region: Secondary | ICD-10-CM | POA: Diagnosis not present

## 2021-10-24 DIAGNOSIS — M9901 Segmental and somatic dysfunction of cervical region: Secondary | ICD-10-CM | POA: Diagnosis not present

## 2021-10-26 DIAGNOSIS — M9903 Segmental and somatic dysfunction of lumbar region: Secondary | ICD-10-CM | POA: Diagnosis not present

## 2021-10-26 DIAGNOSIS — M9904 Segmental and somatic dysfunction of sacral region: Secondary | ICD-10-CM | POA: Diagnosis not present

## 2021-10-26 DIAGNOSIS — M9905 Segmental and somatic dysfunction of pelvic region: Secondary | ICD-10-CM | POA: Diagnosis not present

## 2021-10-26 DIAGNOSIS — M9901 Segmental and somatic dysfunction of cervical region: Secondary | ICD-10-CM | POA: Diagnosis not present

## 2021-10-28 DIAGNOSIS — M9903 Segmental and somatic dysfunction of lumbar region: Secondary | ICD-10-CM | POA: Diagnosis not present

## 2021-10-28 DIAGNOSIS — M9905 Segmental and somatic dysfunction of pelvic region: Secondary | ICD-10-CM | POA: Diagnosis not present

## 2021-10-28 DIAGNOSIS — M9904 Segmental and somatic dysfunction of sacral region: Secondary | ICD-10-CM | POA: Diagnosis not present

## 2021-10-28 DIAGNOSIS — M9901 Segmental and somatic dysfunction of cervical region: Secondary | ICD-10-CM | POA: Diagnosis not present

## 2021-10-31 DIAGNOSIS — M9903 Segmental and somatic dysfunction of lumbar region: Secondary | ICD-10-CM | POA: Diagnosis not present

## 2021-10-31 DIAGNOSIS — M9901 Segmental and somatic dysfunction of cervical region: Secondary | ICD-10-CM | POA: Diagnosis not present

## 2021-10-31 DIAGNOSIS — M9904 Segmental and somatic dysfunction of sacral region: Secondary | ICD-10-CM | POA: Diagnosis not present

## 2021-10-31 DIAGNOSIS — M9905 Segmental and somatic dysfunction of pelvic region: Secondary | ICD-10-CM | POA: Diagnosis not present

## 2021-11-02 DIAGNOSIS — M9903 Segmental and somatic dysfunction of lumbar region: Secondary | ICD-10-CM | POA: Diagnosis not present

## 2021-11-02 DIAGNOSIS — M9901 Segmental and somatic dysfunction of cervical region: Secondary | ICD-10-CM | POA: Diagnosis not present

## 2021-11-02 DIAGNOSIS — M9904 Segmental and somatic dysfunction of sacral region: Secondary | ICD-10-CM | POA: Diagnosis not present

## 2021-11-02 DIAGNOSIS — M9905 Segmental and somatic dysfunction of pelvic region: Secondary | ICD-10-CM | POA: Diagnosis not present

## 2021-11-04 DIAGNOSIS — M9901 Segmental and somatic dysfunction of cervical region: Secondary | ICD-10-CM | POA: Diagnosis not present

## 2021-11-04 DIAGNOSIS — M9904 Segmental and somatic dysfunction of sacral region: Secondary | ICD-10-CM | POA: Diagnosis not present

## 2021-11-04 DIAGNOSIS — M9905 Segmental and somatic dysfunction of pelvic region: Secondary | ICD-10-CM | POA: Diagnosis not present

## 2021-11-04 DIAGNOSIS — M9903 Segmental and somatic dysfunction of lumbar region: Secondary | ICD-10-CM | POA: Diagnosis not present

## 2021-11-07 DIAGNOSIS — M9901 Segmental and somatic dysfunction of cervical region: Secondary | ICD-10-CM | POA: Diagnosis not present

## 2021-11-07 DIAGNOSIS — M9904 Segmental and somatic dysfunction of sacral region: Secondary | ICD-10-CM | POA: Diagnosis not present

## 2021-11-07 DIAGNOSIS — M9905 Segmental and somatic dysfunction of pelvic region: Secondary | ICD-10-CM | POA: Diagnosis not present

## 2021-11-07 DIAGNOSIS — M9903 Segmental and somatic dysfunction of lumbar region: Secondary | ICD-10-CM | POA: Diagnosis not present

## 2021-11-09 DIAGNOSIS — M9905 Segmental and somatic dysfunction of pelvic region: Secondary | ICD-10-CM | POA: Diagnosis not present

## 2021-11-09 DIAGNOSIS — M9901 Segmental and somatic dysfunction of cervical region: Secondary | ICD-10-CM | POA: Diagnosis not present

## 2021-11-09 DIAGNOSIS — M9903 Segmental and somatic dysfunction of lumbar region: Secondary | ICD-10-CM | POA: Diagnosis not present

## 2021-11-09 DIAGNOSIS — M9904 Segmental and somatic dysfunction of sacral region: Secondary | ICD-10-CM | POA: Diagnosis not present

## 2021-11-11 DIAGNOSIS — M9903 Segmental and somatic dysfunction of lumbar region: Secondary | ICD-10-CM | POA: Diagnosis not present

## 2021-11-11 DIAGNOSIS — M9904 Segmental and somatic dysfunction of sacral region: Secondary | ICD-10-CM | POA: Diagnosis not present

## 2021-11-11 DIAGNOSIS — M9905 Segmental and somatic dysfunction of pelvic region: Secondary | ICD-10-CM | POA: Diagnosis not present

## 2021-11-11 DIAGNOSIS — M9901 Segmental and somatic dysfunction of cervical region: Secondary | ICD-10-CM | POA: Diagnosis not present

## 2021-11-16 ENCOUNTER — Encounter: Payer: Medicare Other | Admitting: Adult Health

## 2021-11-25 ENCOUNTER — Other Ambulatory Visit: Payer: Self-pay | Admitting: Adult Health

## 2021-11-25 DIAGNOSIS — N529 Male erectile dysfunction, unspecified: Secondary | ICD-10-CM

## 2021-11-25 DIAGNOSIS — I10 Essential (primary) hypertension: Secondary | ICD-10-CM

## 2021-11-26 ENCOUNTER — Other Ambulatory Visit: Payer: Self-pay | Admitting: Adult Health

## 2021-11-26 DIAGNOSIS — I10 Essential (primary) hypertension: Secondary | ICD-10-CM

## 2021-12-06 ENCOUNTER — Encounter: Payer: Self-pay | Admitting: Pharmacist

## 2021-12-06 DIAGNOSIS — Z9229 Personal history of other drug therapy: Secondary | ICD-10-CM

## 2021-12-06 NOTE — Progress Notes (Signed)
[facility])                                            [place] Team                                        Statin Quality Measure Assessment    12/06/2021  Holton Sidman December 11, 1950 [id number]  Per review of chart and payor information, patient has a diagnosis of diabetes but is not currently filling a statin prescription.  This places patient into the SUPD (Statin Use In Patients with Diabetes) measure for CMS.    Simvastatin 10 mg is on the patient's medication list but has not been filled since 11/22. Patient has an upcoming PCP appointment 12/08/21.   If deemed therapeutically appropriate, statin therapy could be assessed during that visit.  If statin therapy is to be continued, a new prescription could be sent to the patient's pharmacy. If the patient experienced statin intolerance, a statin exclusion code could be associated with the upcoming visit--which would remove the patient from the measure.   The 10-year ASCVD risk score (Arnett DK, et al., 2019) is: 28.9%*   Values used to calculate the score:     Age: 71 years     Sex: Male     Is Non-Hispanic African American: Yes     Diabetic: Yes     Tobacco smoker: No     Systolic Blood Pressure: 389 mmHg     Is BP treated: Yes     HDL Cholesterol: 44 mg/dL*     Total Cholesterol: 169 mg/dL*     * - Cholesterol units were assumed for this score calculation 11/03/2020     Component Value Date/Time   CHOL 167 11/03/2020 1004   TRIG 115.0 11/03/2020 1004   HDL 39.10 11/03/2020 1004   CHOLHDL 4 11/03/2020 1004   VLDL 23.0 11/03/2020 1004   LDLCALC 105 (H) 11/03/2020 1004    Please consider ONE of the following recommendations:  Initiate high intensity statin Atorvastatin '40mg'$  once daily, #90, 3 refills   Rosuvastatin '20mg'$  once daily, #90, 3 refills    Initiate moderate intensity          statin with reduced frequency if prior          statin intolerance 1x  weekly, #13, 3 refills   2x weekly, #26, 3 refills   3x weekly, #39, 3 refills    Code for past statin intolerance or  other exclusions (required annually)   Provider Requirements:  Associate code during an office visit or telehealth encounter  Drug Induced Myopathy G72.0   Myopathy, unspecified G72.9   Myositis, unspecified M60.9   Rhabdomyolysis H73.42   Alcoholic fatty liver A76.8   Cirrhosis of liver K74.69   Prediabetes R73.03   PCOS E28.2   Toxic liver disease, unspecified K71.9         Plan: route note to PCP.  Elayne Guerin, PharmD, [place] Clinical Pharmacist [phone]

## 2021-12-08 ENCOUNTER — Ambulatory Visit (INDEPENDENT_AMBULATORY_CARE_PROVIDER_SITE_OTHER): Payer: Medicare Other

## 2021-12-08 ENCOUNTER — Encounter: Payer: Self-pay | Admitting: Adult Health

## 2021-12-08 ENCOUNTER — Telehealth: Payer: Self-pay | Admitting: Adult Health

## 2021-12-08 ENCOUNTER — Ambulatory Visit (INDEPENDENT_AMBULATORY_CARE_PROVIDER_SITE_OTHER): Payer: Medicare Other | Admitting: Adult Health

## 2021-12-08 VITALS — BP 138/90 | HR 79 | Temp 98.7°F | Ht 72.05 in | Wt 211.0 lb

## 2021-12-08 DIAGNOSIS — Z0000 Encounter for general adult medical examination without abnormal findings: Secondary | ICD-10-CM | POA: Diagnosis not present

## 2021-12-08 DIAGNOSIS — M545 Low back pain, unspecified: Secondary | ICD-10-CM

## 2021-12-08 DIAGNOSIS — I10 Essential (primary) hypertension: Secondary | ICD-10-CM

## 2021-12-08 DIAGNOSIS — Z125 Encounter for screening for malignant neoplasm of prostate: Secondary | ICD-10-CM

## 2021-12-08 DIAGNOSIS — R7303 Prediabetes: Secondary | ICD-10-CM | POA: Diagnosis not present

## 2021-12-08 DIAGNOSIS — E785 Hyperlipidemia, unspecified: Secondary | ICD-10-CM

## 2021-12-08 LAB — LIPID PANEL
Cholesterol: 162 mg/dL (ref 0–200)
HDL: 43.5 mg/dL (ref >39.00)
LDL Cholesterol: 103 mg/dL — ABNORMAL HIGH (ref 0–99)
NonHDL: 118.01
Total CHOL/HDL Ratio: 4
Triglycerides: 74 mg/dL (ref 0.0–149.0)
VLDL: 14.8 mg/dL (ref 0.0–40.0)

## 2021-12-08 LAB — CBC WITH DIFFERENTIAL/PLATELET
Basophils Absolute: 0 10*3/uL (ref 0.0–0.1)
Basophils Relative: 0.2 % (ref 0.0–3.0)
Eosinophils Absolute: 0.1 10*3/uL (ref 0.0–0.7)
Eosinophils Relative: 2.4 % (ref 0.0–5.0)
HCT: 39.6 % (ref 39.0–52.0)
Hemoglobin: 12.7 g/dL — ABNORMAL LOW (ref 13.0–17.0)
Lymphocytes Relative: 46 % (ref 12.0–46.0)
Lymphs Abs: 2.3 10*3/uL (ref 0.7–4.0)
MCHC: 32 g/dL (ref 30.0–36.0)
MCV: 73.3 fl — ABNORMAL LOW (ref 78.0–100.0)
Monocytes Absolute: 0.5 10*3/uL (ref 0.1–1.0)
Monocytes Relative: 9 % (ref 3.0–12.0)
Neutro Abs: 2.1 10*3/uL (ref 1.4–7.7)
Neutrophils Relative %: 42.4 % — ABNORMAL LOW (ref 43.0–77.0)
Platelets: 199 10*3/uL (ref 150.0–400.0)
RBC: 5.41 Mil/uL (ref 4.22–5.81)
RDW: 16.3 % — ABNORMAL HIGH (ref 11.5–15.5)
WBC: 5.1 10*3/uL (ref 4.0–10.5)

## 2021-12-08 LAB — COMPREHENSIVE METABOLIC PANEL
ALT: 22 U/L (ref 0–53)
AST: 23 U/L (ref 0–37)
Albumin: 4.4 g/dL (ref 3.5–5.2)
Alkaline Phosphatase: 44 U/L (ref 39–117)
BUN: 22 mg/dL (ref 6–23)
CO2: 29 mEq/L (ref 19–32)
Calcium: 9.7 mg/dL (ref 8.4–10.5)
Chloride: 102 mEq/L (ref 96–112)
Creatinine, Ser: 1.57 mg/dL — ABNORMAL HIGH (ref 0.40–1.50)
GFR: 43.57 mL/min — ABNORMAL LOW (ref >60.00)
Glucose, Bld: 94 mg/dL (ref 70–99)
Potassium: 4.8 mEq/L (ref 3.5–5.1)
Sodium: 142 mEq/L (ref 135–145)
Total Bilirubin: 0.7 mg/dL (ref 0.2–1.2)
Total Protein: 7.2 g/dL (ref 6.0–8.3)

## 2021-12-08 LAB — HEMOGLOBIN A1C: Hgb A1c MFr Bld: 6.5 % (ref 4.6–6.5)

## 2021-12-08 LAB — PSA: PSA: 0.43 ng/mL (ref 0.10–4.00)

## 2021-12-08 LAB — TSH: TSH: 0.85 u[IU]/mL (ref 0.35–5.50)

## 2021-12-08 NOTE — Progress Notes (Signed)
Subjective:    Patient ID: David Cannon, male    DOB: 01-08-1951, 71 y.o.   MRN: [id number]  HPI  Patient presents for yearly preventative medicine examination. He is a pleasant 71  year old male who  has a past medical history of Diabetes mellitus without complication ([place]), Hyperlipidemia, Hypertension, and Personal history of colonic polyps (03/08/2001).  He is seen at the [place] as well   Hypertension -managed with Benicar HCT 20-12.'5mg'$ .  His blood pressures have been well-controlled in the past.  He denies dizziness, lightheadedness, chest pain, shortness of breath BP Readings from Last 3 Encounters:  12/08/21 (!) 138/90  09/27/21 112/68  01/13/21 120/64   Hyperlipidemia -currently prescribed simvastatin 10 mg daily - he stopped the medication due to having pain in his chest.  Lab Results  Component Value Date   CHOL 167 11/03/2020   HDL 39.10 11/03/2020   LDLCALC 105 (H) 11/03/2020   TRIG 115.0 11/03/2020   CHOLHDL 4 11/03/2020   Prediabetes - managed with metformin 500 mg daily.  Lab Results  Component Value Date   HGBA1C 6.4 11/03/2020   GERD- takes Prilosec 20 mg daily PRN   Low Back Pain - this is his biggest complaint today, has been going on for a few months. He did see a chiropractor and reports relief when going through therapy but could not afford it any longer. Once his therapy stopped, the low back pain came back. Sitting, changing positions, and standing for long periods of time cause worsening pain. Standing for short periods of time, twisting, and bending help with the pain. He is unable to describe the pain. No radiating pain or numbness/tingling in legs noted. He does not have any issues with bowel or bladder.    All immunizations and health maintenance protocols were reviewed with the patient and needed orders were placed.  Appropriate screening laboratory values were ordered for the patient including screening of hyperlipidemia, renal function and hepatic  function. If indicated by BPH, a PSA was ordered.  Medication reconciliation,  past medical history, social history, problem list and allergies were reviewed in detail with the patient  Goals were established with regard to weight loss, exercise, and  diet in compliance with medications  Wt Readings from Last 3 Encounters:  12/08/21 211 lb (95.7 kg)  09/27/21 213 lb 9.6 oz (96.9 kg)  01/13/21 215 lb 9.6 oz (97.8 kg)    Review of Systems  Constitutional: Negative.   HENT: Negative.    Eyes: Negative.   Respiratory: Negative.    Cardiovascular: Negative.   Gastrointestinal: Negative.   Endocrine: Negative.   Genitourinary: Negative.   Musculoskeletal:  Positive for back pain.  Skin: Negative.   Allergic/Immunologic: Negative.   Neurological: Negative.   Hematological: Negative.   Psychiatric/Behavioral: Negative.    All other systems reviewed and are negative.  Past Medical History:  Diagnosis Date   Diabetes mellitus without complication ([place])    Hyperlipidemia    Hypertension    Personal history of colonic polyps 03/08/2001    Social History   Socioeconomic History   Marital status: Married    Spouse name: Not on file   Number of children: Not on file   Years of education: Not on file   Highest education level: Not on file  Occupational History   Not on file  Tobacco Use   Smoking status: Never    Passive exposure: Past (mother smoked in home when a child)   Smokeless tobacco: Never  Vaping Use   Vaping Use: Never used  Substance and Sexual Activity   Alcohol use: Yes    Comment: once every 3 months   Drug use: No   Sexual activity: Not on file  Other Topics Concern   Not on file  Social History Narrative   Retired from working at the [employer]    Married    Daughter and step son   Three grand children       He likes to go to [employer] and spending time with his wife.       Social Determinants of Health   Financial Resource Strain: Low Risk   (12/24/2020)   Overall Financial Resource Strain (CARDIA)    Difficulty of Paying Living Expenses: Not hard at all  Food Insecurity: No Food Insecurity (12/24/2020)   Hunger Vital Sign    Worried About Running Out of Food in the Last Year: Never true    Ran Out of Food in the Last Year: Never true  Transportation Needs: No Transportation Needs (12/24/2020)   PRAPARE - Hydrologist (Medical): No    Lack of Transportation (Non-Medical): No  Physical Activity: Insufficiently Active (12/24/2020)   Exercise Vital Sign    Days of Exercise per Week: 3 days    Minutes of Exercise per Session: 30 min  Stress: No Stress Concern Present (12/24/2020)   [place]    Feeling of Stress : Not at all  Social Connections: Moderately Integrated (12/24/2020)   Social Connection and Isolation Panel [NHANES]    Frequency of Communication with Friends and Family: Three times a week    Frequency of Social Gatherings with Friends and Family: Three times a week    Attends Religious Services: More than 4 times per year    Active Member of Clubs or Organizations: No    Attends Archivist Meetings: Never    Marital Status: Married  Human resources officer Violence: Not At Risk (12/24/2020)   Humiliation, Afraid, Rape, and Kick questionnaire    Fear of Current or Ex-Partner: No    Emotionally Abused: No    Physically Abused: No    Sexually Abused: No    Past Surgical History:  Procedure Laterality Date   COLONOSCOPY W/ POLYPECTOMY      Family History  Problem Relation Age of Onset   Lung cancer Mother        lung   Prostate cancer Father    Cancer Maternal Grandfather        colon   Colon cancer Maternal Grandfather    Coronary artery disease Neg Hx    Colon polyps Neg Hx    Rectal cancer Neg Hx    Stomach cancer Neg Hx    Esophageal cancer Neg Hx     Allergies  Allergen Reactions   Simvastatin      Other reaction(s): Muscle pain    Current Outpatient Medications on File Prior to Visit  Medication Sig Dispense Refill   latanoprost (XALATAN) 0.005 % ophthalmic solution Apply to eye.     metFORMIN (GLUCOPHAGE) 500 MG tablet Take 1 tablet by mouth once daily with breakfast 90 tablet 0   olmesartan-hydrochlorothiazide (BENICAR HCT) 20-12.5 MG tablet Take 1 tablet by mouth once daily 90 tablet 0   omeprazole (PRILOSEC) 20 MG capsule Take 1 capsule by mouth daily as needed.     sildenafil (REVATIO) 20 MG tablet TAKE 2 TO 5  TABLETS BY MOUTH AS NEEDED 90 tablet 0   simvastatin (ZOCOR) 10 MG tablet Take 1 tablet (10 mg total) by mouth at bedtime. 90 tablet 3   No current facility-administered medications on file prior to visit.    BP (!) 138/90 (BP Location: Left Arm, Patient Position: Standing, Cuff Size: Normal)   Pulse 79   Temp 98.7 F (37.1 C) (Oral)   Ht 6' 0.05" (1.83 m)   Wt 211 lb (95.7 kg)   SpO2 98%   BMI 28.58 kg/m       Objective:   Physical Exam Vitals and nursing note reviewed.  Constitutional:      General: He is not in acute distress.    Appearance: Normal appearance. He is well-developed and normal weight.  HENT:     Head: Normocephalic and atraumatic.     Right Ear: Tympanic membrane, ear canal and external ear normal. There is no impacted cerumen.     Left Ear: Tympanic membrane, ear canal and external ear normal. There is no impacted cerumen.     Nose: Nose normal. No congestion or rhinorrhea.     Mouth/Throat:     Mouth: Mucous membranes are moist.     Pharynx: Oropharynx is clear. No oropharyngeal exudate or posterior oropharyngeal erythema.  Eyes:     General:        Right eye: No discharge.        Left eye: No discharge.     Extraocular Movements: Extraocular movements intact.     Conjunctiva/sclera: Conjunctivae normal.     Pupils: Pupils are equal, round, and reactive to light.  Neck:     Vascular: No carotid bruit.     Trachea: No tracheal  deviation.  Cardiovascular:     Rate and Rhythm: Normal rate and regular rhythm.     Pulses: Normal pulses.     Heart sounds: Normal heart sounds. No murmur heard.    No friction rub. No gallop.  Pulmonary:     Effort: Pulmonary effort is normal. No respiratory distress.     Breath sounds: Normal breath sounds. No stridor. No wheezing, rhonchi or rales.  Chest:     Chest wall: No tenderness.  Abdominal:     General: Bowel sounds are normal. There is no distension.     Palpations: Abdomen is soft. There is no mass.     Tenderness: There is no abdominal tenderness. There is no right CVA tenderness, left CVA tenderness, guarding or rebound.     Hernia: No hernia is present.  Musculoskeletal:        General: No swelling, tenderness, deformity or signs of injury. Normal range of motion.     Lumbar back: Normal.     Right lower leg: No edema.     Left lower leg: No edema.     Comments: Could not recreate pain during exam today    Lymphadenopathy:     Cervical: No cervical adenopathy.  Skin:    General: Skin is warm and dry.     Capillary Refill: Capillary refill takes less than 2 seconds.     Coloration: Skin is not jaundiced or pale.     Findings: No bruising, erythema, lesion or rash.  Neurological:     General: No focal deficit present.     Mental Status: He is alert and oriented to person, place, and time.     Cranial Nerves: No cranial nerve deficit.     Sensory: No sensory deficit.  Motor: No weakness.     Coordination: Coordination normal.     Gait: Gait normal.     Deep Tendon Reflexes: Reflexes normal.  Psychiatric:        Mood and Affect: Mood normal.        Behavior: Behavior normal.        Thought Content: Thought content normal.        Judgment: Judgment normal.       Assessment & Plan:  1. Routine general medical examination at a health care facility - Stay active and eat healthy  - Follow up in one year or sooner if needed - CBC with  Differential/Platelet; Future - Comprehensive metabolic panel; Future - Hemoglobin A1c; Future - Lipid panel; Future - TSH; Future - TSH - Lipid panel - Hemoglobin A1c - Comprehensive metabolic panel - CBC with Differential/Platelet  2. Hyperlipidemia, unspecified hyperlipidemia type - Consider another statin  - CBC with Differential/Platelet; Future - Comprehensive metabolic panel; Future - Hemoglobin A1c; Future - Lipid panel; Future - TSH; Future - TSH - Lipid panel - Hemoglobin A1c - Comprehensive metabolic panel - CBC with Differential/Platelet  3. Essential hypertension - Well controlled. No change in medication  - CBC with Differential/Platelet; Future - Comprehensive metabolic panel; Future - Hemoglobin A1c; Future - Lipid panel; Future - TSH; Future - TSH - Lipid panel - Hemoglobin A1c - Comprehensive metabolic panel - CBC with Differential/Platelet  4. Prostate cancer screening  - PSA; Future - PSA  5. Borderline diabetic - Consider increase in metformin  - CBC with Differential/Platelet; Future - Comprehensive metabolic panel; Future - Hemoglobin A1c; Future - Lipid panel; Future - TSH; Future - TSH - Lipid panel - Hemoglobin A1c - Comprehensive metabolic panel - CBC with Differential/Platelet  6. Lumbar spine pain - Consider PT/ortho consult - May need advanced imaging  - DG Lumbar Spine Complete; Future   [name], NP

## 2021-12-08 NOTE — Patient Instructions (Signed)
Health Maintenance Due  Topic Date Due   Zoster Vaccines- Shingrix (1 of 2) Never done   INFLUENZA VACCINE  11/29/2021      Row Labels 12/24/2020    8:26 AM 01/12/2020    8:29 AM 01/07/2020    8:29 AM  Depression screen PHQ 2/9   Section Header. No data exists in this row.     Decreased Interest   0 0 0  Down, Depressed, Hopeless   0 0 0  PHQ - 2 Score   0 0 0  Altered sleeping    0   Tired, decreased energy    0   Change in appetite    0   Feeling bad or failure about yourself     0   Trouble concentrating    0   Moving slowly or fidgety/restless    0   Suicidal thoughts    0   PHQ-9 Score    0   Difficult doing work/chores    Not difficult at all

## 2021-12-08 NOTE — Telephone Encounter (Signed)
Entered in error

## 2021-12-09 ENCOUNTER — Other Ambulatory Visit: Payer: Self-pay

## 2021-12-09 DIAGNOSIS — I10 Essential (primary) hypertension: Secondary | ICD-10-CM

## 2021-12-09 MED ORDER — OLMESARTAN MEDOXOMIL 40 MG PO TABS: 40.0000 mg | ORAL_TABLET | Freq: Every day | ORAL | 1 refills | Status: DC

## 2021-12-19 DIAGNOSIS — E119 Type 2 diabetes mellitus without complications: Secondary | ICD-10-CM | POA: Diagnosis not present

## 2021-12-19 LAB — HM DIABETES EYE EXAM

## 2021-12-26 ENCOUNTER — Ambulatory Visit (INDEPENDENT_AMBULATORY_CARE_PROVIDER_SITE_OTHER): Payer: Medicare Other

## 2021-12-26 VITALS — BP 113/62 | Ht 72.0 in | Wt 211.0 lb

## 2021-12-26 DIAGNOSIS — Z0000 Encounter for general adult medical examination without abnormal findings: Secondary | ICD-10-CM | POA: Diagnosis not present

## 2021-12-26 NOTE — Patient Instructions (Addendum)
David Cannon , Thank you for taking time to come for your Medicare Wellness Visit. I appreciate your ongoing commitment to your health goals. Please review the following plan we discussed and let me know if I can assist you in the future.   Screening recommendations/referrals: Colonoscopy: Done 01/13/21 Repeat 5 yrs Recommended yearly ophthalmology/optometry visit for glaucoma screening and checkup Recommended yearly dental visit for hygiene and checkup  Vaccinations: Influenza vaccine: Up to date Pneumococcal vaccine: Up to ate Tdap vaccine: Up to date Shingles vaccine: Patient deferred   Covid-19: Up to date  Advanced directives: None  Conditions/risks identified: None  Next appointment: Follow up in one year for your annual wellness visit.   Preventive Care 73 Years and Older, Male  Preventive care refers to lifestyle choices and visits with your health care provider that can promote health and wellness. What does preventive care include? A yearly physical exam. This is also called an annual well check. Dental exams once or twice a year. Routine eye exams. Ask your health care provider how often you should have your eyes checked. Personal lifestyle choices, including: Daily care of your teeth and gums. Regular physical activity. Eating a healthy diet. Avoiding tobacco and drug use. Limiting alcohol use. Practicing safe sex. Taking low doses of aspirin every day. Taking vitamin and mineral supplements as recommended by your health care provider. What happens during an annual well check? The services and screenings done by your health care provider during your annual well check will depend on your age, overall health, lifestyle risk factors, and family history of disease. Counseling  Your health care provider may ask you questions about your: Alcohol use. Tobacco use. Drug use. Emotional well-being. Home and relationship well-being. Sexual activity. Eating  habits. History of falls. Memory and ability to understand (cognition). Work and work Statistician. Screening  You may have the following tests or measurements: Height, weight, and BMI. Blood pressure. Lipid and cholesterol levels. These may be checked every 5 years, or more frequently if you are over 80 years old. Skin check. Lung cancer screening. You may have this screening every year starting at age 73 if you have a 30-pack-year history of smoking and currently smoke or have quit within the past 15 years. Fecal occult blood test (FOBT) of the stool. You may have this test every year starting at age 73. Flexible sigmoidoscopy or colonoscopy. You may have a sigmoidoscopy every 5 years or a colonoscopy every 10 years starting at age 73. Prostate cancer screening. Recommendations will vary depending on your family history and other risks. Hepatitis C blood test. Hepatitis B blood test. Sexually transmitted disease (STD) testing. Diabetes screening. This is done by checking your blood sugar (glucose) after you have not eaten for a while (fasting). You may have this done every 1-3 years. Abdominal aortic aneurysm (AAA) screening. You may need this if you are a current or former smoker. Osteoporosis. You may be screened starting at age 73 if you are at high risk. Talk with your health care provider about your test results, treatment options, and if necessary, the need for more tests. Vaccines  Your health care provider may recommend certain vaccines, such as: Influenza vaccine. This is recommended every year. Tetanus, diphtheria, and acellular pertussis (Tdap, Td) vaccine. You may need a Td booster every 10 years. Zoster vaccine. You may need this after age 65. Pneumococcal 13-valent conjugate (PCV13) vaccine. One dose is recommended after age 73. Pneumococcal polysaccharide (PPSV23) vaccine. One dose is recommended after  age 73. Talk to your health care provider about which screenings and  vaccines you need and how often you need them. This information is not intended to replace advice given to you by your health care provider. Make sure you discuss any questions you have with your health care provider. Document Released: 05/14/2015 Document Revised: 01/05/2016 Document Reviewed: 02/16/2015 Elsevier Interactive Patient Education  2017 [place] Prevention in the Home Falls can cause injuries. They can happen to people of all ages. There are many things you can do to make your home safe and to help prevent falls. What can I do on the outside of my home? Regularly fix the edges of walkways and driveways and fix any cracks. Remove anything that might make you trip as you walk through a door, such as a raised step or threshold. Trim any bushes or trees on the path to your home. Use bright outdoor lighting. Clear any walking paths of anything that might make someone trip, such as rocks or tools. Regularly check to see if handrails are loose or broken. Make sure that both sides of any steps have handrails. Any raised decks and porches should have guardrails on the edges. Have any leaves, snow, or ice cleared regularly. Use sand or salt on walking paths during winter. Clean up any spills in your garage right away. This includes oil or grease spills. What can I do in the bathroom? Use night lights. Install grab bars by the toilet and in the tub and shower. Do not use towel bars as grab bars. Use non-skid mats or decals in the tub or shower. If you need to sit down in the shower, use a plastic, non-slip stool. Keep the floor dry. Clean up any water that spills on the floor as soon as it happens. Remove soap buildup in the tub or shower regularly. Attach bath mats securely with double-sided non-slip rug tape. Do not have throw rugs and other things on the floor that can make you trip. What can I do in the bedroom? Use night lights. Make sure that you have a light by your  bed that is easy to reach. Do not use any sheets or blankets that are too big for your bed. They should not hang down onto the floor. Have a firm chair that has side arms. You can use this for support while you get dressed. Do not have throw rugs and other things on the floor that can make you trip. What can I do in the kitchen? Clean up any spills right away. Avoid walking on wet floors. Keep items that you use a lot in easy-to-reach places. If you need to reach something above you, use a strong step stool that has a grab bar. Keep electrical cords out of the way. Do not use floor polish or wax that makes floors slippery. If you must use wax, use non-skid floor wax. Do not have throw rugs and other things on the floor that can make you trip. What can I do with my stairs? Do not leave any items on the stairs. Make sure that there are handrails on both sides of the stairs and use them. Fix handrails that are broken or loose. Make sure that handrails are as long as the stairways. Check any carpeting to make sure that it is firmly attached to the stairs. Fix any carpet that is loose or worn. Avoid having throw rugs at the top or bottom of the stairs. If you do  have throw rugs, attach them to the floor with carpet tape. Make sure that you have a light switch at the top of the stairs and the bottom of the stairs. If you do not have them, ask someone to add them for you. What else can I do to help prevent falls? Wear shoes that: Do not have high heels. Have rubber bottoms. Are comfortable and fit you well. Are closed at the toe. Do not wear sandals. If you use a stepladder: Make sure that it is fully opened. Do not climb a closed stepladder. Make sure that both sides of the stepladder are locked into place. Ask someone to hold it for you, if possible. Clearly mark and make sure that you can see: Any grab bars or handrails. First and last steps. Where the edge of each step is. Use tools that  help you move around (mobility aids) if they are needed. These include: Canes. Walkers. Scooters. Crutches. Turn on the lights when you go into a dark area. Replace any light bulbs as soon as they burn out. Set up your furniture so you have a clear path. Avoid moving your furniture around. If any of your floors are uneven, fix them. If there are any pets around you, be aware of where they are. Review your medicines with your doctor. Some medicines can make you feel dizzy. This can increase your chance of falling. Ask your doctor what other things that you can do to help prevent falls. This information is not intended to replace advice given to you by your health care provider. Make sure you discuss any questions you have with your health care provider. Document Released: 02/11/2009 Document Revised: 09/23/2015 Document Reviewed: 05/22/2014 Elsevier Interactive Patient Education  2017 [employer].

## 2021-12-26 NOTE — Progress Notes (Signed)
Subjective:   David Cannon is a 71 y.o. male who presents for Medicare Annual/Subsequent preventive examination.  Review of Systems    Virtual Visit via Telephone Note  I connected with  David Cannon on 12/26/21 at  9:15 AM EDT by telephone and verified that I am speaking with the correct person using two identifiers.  Location: Patient: Home Provider: Office Persons participating in the virtual visit: patient/Nurse Health Advisor   I discussed the limitations, risks, security and privacy concerns of performing an evaluation and management service by telephone and the availability of in person appointments. The patient expressed understanding and agreed to proceed.  Interactive audio and video telecommunications were attempted between this nurse and patient, however failed, due to patient having technical difficulties OR patient did not have access to video capability.  We continued and completed visit with audio only.  Some vital signs may be absent or patient reported.   Criselda Peaches, LPN  Cardiac Risk Factors include: none     Objective:    Today's Vitals   12/26/21 0942  BP: 113/62  Weight: 211 lb (95.7 kg)  Height: 6' (1.829 m)   Body mass index is 28.62 kg/m.     12/26/2021    9:53 AM 12/24/2020    8:25 AM 01/12/2020    8:27 AM 06/28/2015   10:42 AM 06/14/2015   10:26 AM  Advanced Directives  Does Patient Have a Medical Advance Directive? No No No No No  Would patient like information on creating a medical advance directive? No - Patient declined No - Patient declined No - Patient declined  No - patient declined information    Current Medications (verified) Outpatient Encounter Medications as of 12/26/2021  Medication Sig   latanoprost (XALATAN) 0.005 % ophthalmic solution Apply to eye.   metFORMIN (GLUCOPHAGE) 500 MG tablet Take 1 tablet by mouth once daily with breakfast   olmesartan (BENICAR) 40 MG tablet Take 1 tablet (40 mg total) by mouth daily.    omeprazole (PRILOSEC) 20 MG capsule Take 1 capsule by mouth daily as needed.   sildenafil (REVATIO) 20 MG tablet TAKE 2 TO 5 TABLETS BY MOUTH AS NEEDED   simvastatin (ZOCOR) 10 MG tablet Take 1 tablet (10 mg total) by mouth at bedtime.   No facility-administered encounter medications on file as of 12/26/2021.    Allergies (verified) Simvastatin   History: Past Medical History:  Diagnosis Date   Diabetes mellitus without complication ([place])    Hyperlipidemia    Hypertension    Personal history of colonic polyps 03/08/2001   Past Surgical History:  Procedure Laterality Date   COLONOSCOPY W/ POLYPECTOMY     Family History  Problem Relation Age of Onset   Lung cancer Mother        lung   Prostate cancer Father    Cancer Maternal Grandfather        colon   Colon cancer Maternal Grandfather    Coronary artery disease Neg Hx    Colon polyps Neg Hx    Rectal cancer Neg Hx    Stomach cancer Neg Hx    Esophageal cancer Neg Hx    Social History   Socioeconomic History   Marital status: Married    Spouse name: Not on file   Number of children: Not on file   Years of education: Not on file   Highest education level: Not on file  Occupational History   Not on file  Tobacco Use   Smoking  status: Never    Passive exposure: Past (mother smoked in home when a child)   Smokeless tobacco: Never  Vaping Use   Vaping Use: Never used  Substance and Sexual Activity   Alcohol use: Yes    Comment: once every 3 months   Drug use: No   Sexual activity: Not on file  Other Topics Concern   Not on file  Social History Narrative   Retired from working at the [employer]    Married    Daughter and step son   Three grand children       He likes to go to [employer] and spending time with his wife.       Social Determinants of Health   Financial Resource Strain: Low Risk  (12/26/2021)   Overall Financial Resource Strain (CARDIA)    Difficulty of Paying Living Expenses: Not hard at all   Food Insecurity: No Food Insecurity (12/26/2021)   Hunger Vital Sign    Worried About Running Out of Food in the Last Year: Never true    Ran Out of Food in the Last Year: Never true  Transportation Needs: No Transportation Needs (12/26/2021)   PRAPARE - Hydrologist (Medical): No    Lack of Transportation (Non-Medical): No  Physical Activity: Inactive (12/26/2021)   Exercise Vital Sign    Days of Exercise per Week: 0 days    Minutes of Exercise per Session: 0 min  Stress: No Stress Concern Present (12/26/2021)   [place]    Feeling of Stress : Not at all  Social Connections: [place] (12/26/2021)   Social Connection and Isolation Panel [NHANES]    Frequency of Communication with Friends and Family: More than three times a week    Frequency of Social Gatherings with Friends and Family: More than three times a week    Attends Religious Services: More than 4 times per year    Active Member of [employer] or Organizations: Yes    Attends Music therapist: More than 4 times per year    Marital Status: Married    Tobacco Counseling Counseling given: Not Answered   Clinical Intake:  Pre-visit preparation completed: No  Pain : No/denies pain     BMI - recorded: 28.58 Nutritional Status: BMI 25 -29 Overweight Nutritional Risks: None Diabetes: Yes (Pre Diabetic) CBG done?: Yes (CBG 113 Taken by patient) CBG resulted in Enter/ Edit results?: Yes Did pt. bring in CBG monitor from home?: No  How often do you need to have someone help you when you read instructions, pamphlets, or other written materials from your doctor or pharmacy?: 1 - Never  Diabetic?  Pre Diabetic  Interpreter Needed?: No  Information entered by :: Rolene Arbour LPN   Activities of Daily Living    12/26/2021    9:51 AM 12/08/2021    8:17 AM  In your present state of health, do you have any  difficulty performing the following activities:  Hearing? 0 0  Vision? 0 0  Difficulty concentrating or making decisions? 0 0  Walking or climbing stairs? 0 1  Comment  back pain  Dressing or bathing? 0 0  Doing errands, shopping? 0 0  Preparing Food and eating ? N   Using the Toilet? N   In the past six months, have you accidently leaked urine? N   Do you have problems with loss of bowel control? N  Managing your Medications? N   Managing your Finances? N   Housekeeping or managing your Housekeeping? N     Patient Care Team: [name], NP as PCP - General (Family Medicine) Viona Gilmore, [facility] as Pharmacist (Pharmacist)  Indicate any recent Medical Services you may have received from other than [facility] providers in the past year (date may be approximate).     Assessment:   This is a routine wellness examination for [place].  Hearing/Vision screen Hearing Screening - Comments:: No hearing difficulty Vision Screening - Comments:: Wears glasses. Followed by [facility]  Dietary issues and exercise activities discussed: Current Exercise Habits: Home exercise routine, Type of exercise: walking, Time (Minutes): 10, Frequency (Times/Week): 1, Weekly Exercise (Minutes/Week): 10, Intensity: Moderate, Exercise limited by: None identified   Goals Addressed               This Visit's Progress     Patient stated (pt-stated)        I would like to lower and maintain my blood glucose levels       Depression Screen    12/26/2021    9:49 AM 12/24/2020    8:26 AM 01/12/2020    8:29 AM 01/07/2020    8:29 AM 05/16/2018    1:19 PM 03/01/2016    7:33 AM 04/30/2015    1:55 PM  PHQ 2/9 Scores  PHQ - 2 Score 0 0 0 0 0 0 0  PHQ- 9 Score   0        Fall Risk    12/26/2021    9:52 AM 12/24/2020    8:26 AM 01/12/2020    8:28 AM 01/07/2020    8:30 AM 05/16/2018    1:19 PM  Fall Risk   Falls in the past year? 0 0 0 0 0  Number falls in past yr: 0 0 0 0   Injury with Fall? 0 0 0 0    Risk for fall due to : No Fall Risks No Fall Risks No Fall Risks    Follow up  Falls evaluation completed Falls evaluation completed;Falls prevention discussed Falls evaluation completed     FALL RISK PREVENTION PERTAINING TO THE HOME:  Any stairs in or around the home? Yes  If so, are there any without handrails? No  Home free of loose throw rugs in walkways, pet beds, electrical cords, etc? Yes  Adequate lighting in your home to reduce risk of falls? Yes   ASSISTIVE DEVICES UTILIZED TO PREVENT FALLS:  Life alert? No  Use of a cane, walker or w/c? Yes  Grab bars in the bathroom? Yes Shower chair or bench in shower? No  Elevated toilet seat or a handicapped toilet? No   TIMED UP AND GO:  Was the test performed? No . Audio Visit  Cognitive Function:        12/26/2021    9:54 AM 01/12/2020    8:30 AM  6CIT Screen  What Year? 0 points 0 points  What month? 0 points 0 points  What time? 0 points 0 points  Count back from 20 0 points 0 points  Months in reverse 0 points 0 points  Repeat phrase 0 points 4 points  Total Score 0 points 4 points    Immunizations Immunization History  Administered Date(s) Administered   DTP 10/30/2002   Influenza Split 03/02/2011   Influenza Whole 03/18/2007, 05/07/2009   Influenza, High Dose Seasonal PF 01/17/2018, 01/30/2020, 01/05/2021   Influenza,inj,Quad PF,6+ Mos 01/12/2016,  12/31/2018   Influenza-Unspecified 02/22/2014   PFIZER(Purple Top)SARS-COV-2 Vaccination 05/20/2019, 06/10/2019, 08/24/2020   Pneumococcal Conjugate-13 01/12/2016   Pneumococcal Polysaccharide-23 01/13/2013, 07/15/2020   Td 12/19/2007   Tdap 12/19/2007, 11/09/2020   Zoster, Live 03/16/2016    TDAP status: Up to date  Flu Vaccine status: Up to date  Pneumococcal vaccine status: Up to date  Covid-19 vaccine status: Completed vaccines  Qualifies for Shingles Vaccine? Yes   Zostavax completed No   Shingrix Completed?: No.    Education has been provided  regarding the importance of this vaccine. Patient has been advised to call insurance company to determine out of pocket expense if they have not yet received this vaccine. Advised may also receive vaccine at local pharmacy or Health Dept. Verbalized acceptance and understanding.  Screening Tests Health Maintenance  Topic Date Due   INFLUENZA VACCINE  11/29/2021   COVID-19 Vaccine (4 - Pfizer series) 01/11/2022 (Originally 10/19/2020)   Zoster Vaccines- Shingrix (1 of 2) 03/28/2022 (Originally 10/21/1998)   COLONOSCOPY (Pts 45-31yr Insurance coverage will need to be confirmed)  01/13/2026   TETANUS/TDAP  11/10/2030   Pneumonia Vaccine 71 Years old  Completed   Hepatitis C Screening  Completed   HPV VACCINES  Aged Out   FOOT EXAM  Discontinued   HEMOGLOBIN A1C  Discontinued   OPHTHALMOLOGY EXAM  Discontinued    Health Maintenance  Health Maintenance Due  Topic Date Due   INFLUENZA VACCINE  11/29/2021    Colorectal cancer screening: Type of screening: Colonoscopy. Completed 01/13/21. Repeat every 5 years  Lung Cancer Screening: (Low Dose CT Chest recommended if Age 73-80years, 30 pack-year currently smoking OR have quit w/in 15years.) does not qualify.     Additional Screening:  Hepatitis C Screening: does qualify; Completed 03/16/16  Vision Screening: Recommended annual ophthalmology exams for early detection of glaucoma and other disorders of the eye. Is the patient up to date with their annual eye exam?  Yes  Who is the provider or what is the name of the office in which the patient attends annual eye exams? Dr [place] pt is not established with a provider, would they like to be referred to a provider to establish care? No .   Dental Screening: Recommended annual dental exams for proper oral hygiene  Community Resource Referral / Chronic Care Management:  CRR required this visit?  No   CCM required this visit?  No      Plan:     I have personally reviewed and  noted the following in the patient's chart:   Medical and social history Use of alcohol, tobacco or illicit drugs  Current medications and supplements including opioid prescriptions. Patient is not currently taking opioid prescriptions. Functional ability and status Nutritional status Physical activity Advanced directives List of other physicians Hospitalizations, surgeries, and ER visits in previous 12 months Vitals Screenings to include cognitive, depression, and falls Referrals and appointments  In addition, I have reviewed and discussed with patient certain preventive protocols, quality metrics, and best practice recommendations. A written personalized care plan for preventive services as well as general preventive health recommendations were provided to patient.     BCriselda Peaches LPN   [id number]/[id number]  Nurse Notes: None

## 2021-12-28 ENCOUNTER — Encounter: Payer: Self-pay | Admitting: Adult Health

## 2022-02-01 ENCOUNTER — Telehealth: Payer: Self-pay | Admitting: Adult Health

## 2022-02-01 MED ORDER — METFORMIN HCL 500 MG PO TABS: 500.0000 mg | ORAL_TABLET | Freq: Every day | ORAL | 1 refills | Status: DC

## 2022-02-01 NOTE — Addendum Note (Signed)
Addended by: Elza Rafter D on: 02/01/2022 12:58 PM   Modules accepted: Orders

## 2022-02-01 NOTE — Telephone Encounter (Signed)
Requesting refill metFORMIN (GLUCOPHAGE) 500 MG tablet

## 2022-02-01 NOTE — Telephone Encounter (Signed)
Medication refilled to pharmacy on file.

## 2022-02-17 ENCOUNTER — Other Ambulatory Visit: Payer: Self-pay | Admitting: Adult Health

## 2022-02-17 DIAGNOSIS — I10 Essential (primary) hypertension: Secondary | ICD-10-CM

## 2022-02-27 ENCOUNTER — Telehealth: Payer: Self-pay

## 2022-02-27 DIAGNOSIS — E785 Hyperlipidemia, unspecified: Secondary | ICD-10-CM

## 2022-02-27 NOTE — Telephone Encounter (Signed)
Last time pt took medication he indicated "chest pain" (see 12/08/21 OV) & that he stopped the medication. PCP also stated another statin may be indicated however, no result note from those labs or indication that anything was done different. Will send to PCP to determine if anything needed. The patient states he will decline recommendations if there is no concern with labs. This RN explained statin recommendations are determined on a case by case basis & I will refer to PCP so that pt is making informed decision. Pt verb understanding.

## 2022-03-02 MED ORDER — ROSUVASTATIN CALCIUM 10 MG PO TABS: 10.0000 mg | ORAL_TABLET | Freq: Every day | ORAL | 3 refills | Status: DC

## 2022-03-02 NOTE — Addendum Note (Signed)
Addended by: Elza Rafter D on: 03/02/2022 02:14 PM   Modules accepted: Orders

## 2022-03-02 NOTE — Telephone Encounter (Signed)
Per PCP: He should be on a statin. Can try Crestor 10 mg daily   Thanks   Pt called & notified of above. He agrees to try different statin. Medication ordered per PCP order. Pt advised to call office if he experiences similar side effect as with Simvastatin. Pt verb understanding.

## 2022-03-20 ENCOUNTER — Telehealth: Payer: Self-pay | Admitting: Adult Health

## 2022-03-20 NOTE — Telephone Encounter (Signed)
Patient states he was advised to change his dosage to 2 tablets daily and needs a prescription written to handle this new dosage. metFORMIN (GLUCOPHAGE) 500 MG tablet  [place], [place] Phone: [phone]  Fax: [phone]

## 2022-03-21 MED ORDER — METFORMIN HCL 500 MG PO TABS: 500.0000 mg | ORAL_TABLET | Freq: Two times a day (BID) | ORAL | 1 refills | Status: DC

## 2022-03-21 NOTE — Telephone Encounter (Signed)
Please advise on dosage

## 2022-03-21 NOTE — Telephone Encounter (Signed)
Rx refilled.

## 2022-03-21 NOTE — Addendum Note (Signed)
Addended by: Gwenyth Ober R on: 03/21/2022 10:20 AM   Modules accepted: Orders

## 2022-03-22 DIAGNOSIS — H401131 Primary open-angle glaucoma, bilateral, mild stage: Secondary | ICD-10-CM | POA: Diagnosis not present

## 2022-03-22 NOTE — Telephone Encounter (Signed)
Patient notified of update  and verbalized understanding. 

## 2022-05-16 ENCOUNTER — Other Ambulatory Visit: Payer: Self-pay | Admitting: Adult Health

## 2022-05-16 DIAGNOSIS — I10 Essential (primary) hypertension: Secondary | ICD-10-CM

## 2022-06-05 ENCOUNTER — Other Ambulatory Visit: Payer: Self-pay | Admitting: Adult Health

## 2022-06-27 DIAGNOSIS — H401131 Primary open-angle glaucoma, bilateral, mild stage: Secondary | ICD-10-CM | POA: Diagnosis not present

## 2022-07-22 ENCOUNTER — Other Ambulatory Visit: Payer: Self-pay | Admitting: Adult Health

## 2022-07-31 ENCOUNTER — Other Ambulatory Visit: Payer: Self-pay | Admitting: Adult Health

## 2022-08-21 ENCOUNTER — Other Ambulatory Visit: Payer: Self-pay | Admitting: Adult Health

## 2022-08-21 DIAGNOSIS — N529 Male erectile dysfunction, unspecified: Secondary | ICD-10-CM

## 2022-09-03 ENCOUNTER — Other Ambulatory Visit: Payer: Self-pay | Admitting: Adult Health

## 2022-09-06 ENCOUNTER — Other Ambulatory Visit: Payer: Self-pay | Admitting: Adult Health

## 2022-09-06 DIAGNOSIS — N529 Male erectile dysfunction, unspecified: Secondary | ICD-10-CM

## 2022-09-18 DIAGNOSIS — H401131 Primary open-angle glaucoma, bilateral, mild stage: Secondary | ICD-10-CM | POA: Diagnosis not present

## 2022-09-26 ENCOUNTER — Telehealth: Payer: Self-pay | Admitting: Adult Health

## 2022-09-26 MED ORDER — METFORMIN HCL 500 MG PO TABS: ORAL_TABLET | ORAL | 0 refills | Status: DC

## 2022-09-26 NOTE — Telephone Encounter (Signed)
Pt called to say there is a problem with his Metformin.  Pt states he is supposed to take it twice a day (60 tablets for 30 days)  Pt states he is supposed to get 180 tablets for 90 days (3 month supply) .  The pharmacy is only giving him 90 tablets at a time, which is not enough for a 90 day supply.   Pt would like a 90 day supply (180 tablets)

## 2022-09-26 NOTE — Telephone Encounter (Signed)
Rx sent for 90 days

## 2022-10-29 ENCOUNTER — Other Ambulatory Visit: Payer: Self-pay | Admitting: Adult Health

## 2022-11-29 ENCOUNTER — Encounter (INDEPENDENT_AMBULATORY_CARE_PROVIDER_SITE_OTHER): Payer: Self-pay

## 2022-12-24 ENCOUNTER — Other Ambulatory Visit: Payer: Self-pay | Admitting: Adult Health

## 2022-12-25 ENCOUNTER — Telehealth: Payer: Self-pay | Admitting: Adult Health

## 2022-12-25 NOTE — Telephone Encounter (Signed)
Pt called to say he was experiencing cough and congestion 2 weeks ago, but did not come in to be tested.   Pt states that he began taking his Omeprazole, and immediately started feeling better.  Pt's wife tested positive for Covid today, and now Pt is wondering if he should come in to be tested, even though he is not currently experiencing any symptoms?  Pt was advised that we normally do not test unless symptoms are present.  Pt is asking if he can be tested anyway?  Pt is aware NP is OOO on Mondays.   Please advise.

## 2022-12-26 ENCOUNTER — Telehealth: Payer: Self-pay | Admitting: Adult Health

## 2022-12-26 ENCOUNTER — Other Ambulatory Visit: Payer: Self-pay | Admitting: Adult Health

## 2022-12-26 MED ORDER — METFORMIN HCL 500 MG PO TABS: ORAL_TABLET | ORAL | 0 refills | Status: DC

## 2022-12-26 NOTE — Telephone Encounter (Signed)
Pharmacy call and stated pt need a New RX for 90 days supply for     metFORMIN (GLUCOPHAGE) 500 MG tablet sent .

## 2022-12-26 NOTE — Telephone Encounter (Signed)
Tried to cal pt no answer

## 2022-12-26 NOTE — Telephone Encounter (Signed)
Rx refilled.

## 2022-12-26 NOTE — Telephone Encounter (Signed)
Pt states this should be adjusted for patient to take 2 a day metFORMIN (GLUCOPHAGE) 500 MG tablet, says he just picked up 90 tabs

## 2022-12-27 NOTE — Telephone Encounter (Signed)
Pt stated that he does not need any assistance now. Pt stated that his wife was not given anything bc she was out of the window. Pt stated that his sx was 2 weeks ago so he knows he is out of the treatment window. Pt does not need anything else.

## 2022-12-29 ENCOUNTER — Ambulatory Visit (INDEPENDENT_AMBULATORY_CARE_PROVIDER_SITE_OTHER): Payer: Medicare Other

## 2022-12-29 VITALS — BP 112/62 | HR 77

## 2022-12-29 DIAGNOSIS — Z0000 Encounter for general adult medical examination without abnormal findings: Secondary | ICD-10-CM

## 2022-12-29 NOTE — Progress Notes (Signed)
Subjective:   David Cannon is a 72 y.o. male who presents for Medicare Annual/Subsequent preventive examination.  Visit Complete: Virtual  I connected with  Marisa Hua on 12/29/22 by a audio enabled telemedicine application and verified that I am speaking with the correct person using two identifiers.  Patient Location: Home  Provider Location: Home Office  I discussed the limitations of evaluation and management by telemedicine. The patient expressed understanding and agreed to proceed.  Vital Signs: Because this visit was a virtual/telehealth visit, some criteria may be missing or patient reported. Any vitals not documented were not able to be obtained and vitals that have been documented are patient reported.   Review of Systems     Cardiac Risk Factors include: advanced age (>79men, >10 women);dyslipidemia;hypertension;male gender     Objective:    Today's Vitals   12/29/22 1029  BP: 112/62  Pulse: 77   There is no height or weight on file to calculate BMI.     12/29/2022   10:36 AM 12/26/2021    9:53 AM 12/24/2020    8:25 AM 01/12/2020    8:27 AM 06/28/2015   10:42 AM 06/14/2015   10:26 AM  Advanced Directives  Does Patient Have a Medical Advance Directive? No No No No No No  Would patient like information on creating a medical advance directive?  No - Patient declined No - Patient declined No - Patient declined  No - patient declined information    Current Medications (verified) Outpatient Encounter Medications as of 12/29/2022  Medication Sig   latanoprost (XALATAN) 0.005 % ophthalmic solution Apply to eye.   metFORMIN (GLUCOPHAGE) 500 MG tablet Take 1 tablet by mouth once daily with breakfast   olmesartan (BENICAR) 40 MG tablet Take 1 tablet by mouth once daily   omeprazole (PRILOSEC) 20 MG capsule Take 1 capsule by mouth daily as needed.   rosuvastatin (CRESTOR) 10 MG tablet Take 1 tablet (10 mg total) by mouth daily.   sildenafil (REVATIO) 20 MG tablet  TAKE 2 TO 5 TABLETS BY MOUTH AS NEEDED   olmesartan-hydrochlorothiazide (BENICAR HCT) 20-12.5 MG tablet Take 1 tablet by mouth once daily (Patient not taking: Reported on 12/29/2022)   No facility-administered encounter medications on file as of 12/29/2022.    Allergies (verified) Simvastatin   History: Past Medical History:  Diagnosis Date   Diabetes mellitus without complication (HCC)    Hyperlipidemia    Hypertension    Personal history of colonic polyps 03/08/2001   Past Surgical History:  Procedure Laterality Date   COLONOSCOPY W/ POLYPECTOMY     Family History  Problem Relation Age of Onset   Lung cancer Mother        lung   Prostate cancer Father    Cancer Maternal Grandfather        colon   Colon cancer Maternal Grandfather    Coronary artery disease Neg Hx    Colon polyps Neg Hx    Rectal cancer Neg Hx    Stomach cancer Neg Hx    Esophageal cancer Neg Hx    Social History   Socioeconomic History   Marital status: Married    Spouse name: Not on file   Number of children: Not on file   Years of education: Not on file   Highest education level: Not on file  Occupational History   Not on file  Tobacco Use   Smoking status: Never    Passive exposure: Past (mother smoked in home when a  child)   Smokeless tobacco: Never  Vaping Use   Vaping status: Never Used  Substance and Sexual Activity   Alcohol use: Yes    Comment: once every 3 months   Drug use: No   Sexual activity: Not on file  Other Topics Concern   Not on file  Social History Narrative   Retired from working at the [employer]    Married    Daughter and step son   Three grand children       He likes to go to [employer] and spending time with his wife.       Social Determinants of Health   Financial Resource Strain: Low Risk  (12/29/2022)   Overall Financial Resource Strain (CARDIA)    Difficulty of Paying Living Expenses: Not hard at all  Food Insecurity: No Food Insecurity (12/29/2022)    Hunger Vital Sign    Worried About Running Out of Food in the Last Year: Never true    Ran Out of Food in the Last Year: Never true  Transportation Needs: No Transportation Needs (12/29/2022)   PRAPARE - Administrator, Civil Service (Medical): No    Lack of Transportation (Non-Medical): No  Physical Activity: Insufficiently Active (12/29/2022)   Exercise Vital Sign    Days of Exercise per Week: 2 days    Minutes of Exercise per Session: 20 min  Stress: No Stress Concern Present (12/29/2022)   [employer] of Occupational Health - Occupational Stress Questionnaire    Feeling of Stress : Not at all  Social Connections: Socially Integrated (12/29/2022)   Social Connection and Isolation Panel [NHANES]    Frequency of Communication with Friends and Family: More than three times a week    Frequency of Social Gatherings with Friends and Family: More than three times a week    Attends Religious Services: More than 4 times per year    Active Member of [employer] or Organizations: Yes    Attends Engineer, structural: More than 4 times per year    Marital Status: Married    Tobacco Counseling Counseling given: Not Answered   Clinical Intake:  Pre-visit preparation completed: Yes  Pain : No/denies pain     Nutritional Risks: None Diabetes: No  How often do you need to have someone help you when you read instructions, pamphlets, or other written materials from your doctor or pharmacy?: 1 - Never  Interpreter Needed?: No  Information entered by :: NAllen LPN   Activities of Daily Living    12/29/2022   10:30 AM  In your present state of health, do you have any difficulty performing the following activities:  Hearing? 0  Vision? 0  Difficulty concentrating or making decisions? 0  Walking or climbing stairs? 0  Dressing or bathing? 0  Doing errands, shopping? 0  Preparing Food and eating ? N  Using the Toilet? N  In the past six months, have you  accidently leaked urine? N  Do you have problems with loss of bowel control? N  Managing your Medications? N  Managing your Finances? N  Housekeeping or managing your Housekeeping? N    Patient Care Team: Shirline Frees, NP as PCP - General (Family Medicine) Verner Chol, [facility] (Inactive) as Pharmacist (Pharmacist) [facility], [place]  Indicate any recent Medical Services you may have received from other than [facility] providers in the past year (date may be approximate).     Assessment:   This is  a routine wellness examination for [place].  Hearing/Vision screen Hearing Screening - Comments:: Denies hearing issues Vision Screening - Comments:: Regular eye exams, [facility]  Dietary issues and exercise activities discussed:     Goals Addressed             This Visit's Progress    Patient Stated       12/29/2022, stay out of the hospital       Depression Screen    12/29/2022   10:38 AM 12/26/2021    9:49 AM 12/24/2020    8:26 AM 01/12/2020    8:29 AM 01/07/2020    8:29 AM 05/16/2018    1:19 PM 03/01/2016    7:33 AM  PHQ 2/9 Scores  PHQ - 2 Score 0 0 0 0 0 0 0  PHQ- 9 Score 0   0       Fall Risk    12/29/2022   10:37 AM 12/26/2021    9:52 AM 12/24/2020    8:26 AM 01/12/2020    8:28 AM 01/07/2020    8:30 AM  Fall Risk   Falls in the past year? 0 0 0 0 0  Number falls in past yr: 0 0 0 0 0  Injury with Fall? 0 0 0 0 0  Risk for fall due to : Medication side effect No Fall Risks No Fall Risks No Fall Risks   Follow up Falls prevention discussed;Falls evaluation completed  Falls evaluation completed Falls evaluation completed;Falls prevention discussed Falls evaluation completed    MEDICARE RISK AT HOME: Medicare Risk at Home Any stairs in or around the home?: Yes If so, are there any without handrails?: No Home free of loose throw rugs in walkways, pet beds, electrical cords, etc?: Yes Adequate lighting in your home to reduce risk of falls?: Yes Life  alert?: No Use of a cane, walker or w/c?: No Grab bars in the bathroom?: No Shower chair or bench in shower?: No Elevated toilet seat or a handicapped toilet?: Yes  TIMED UP AND GO:  Was the test performed?  No    Cognitive Function:        12/29/2022   10:41 AM 12/26/2021    9:54 AM 01/12/2020    8:30 AM  6CIT Screen  What Year? 0 points 0 points 0 points  What month? 0 points 0 points 0 points  What time? 0 points 0 points 0 points  Count back from 20 0 points 0 points 0 points  Months in reverse 0 points 0 points 0 points  Repeat phrase 2 points 0 points 4 points  Total Score 2 points 0 points 4 points    Immunizations Immunization History  Administered Date(s) Administered   DTP 10/30/2002   Influenza Split 03/02/2011   Influenza Whole 03/18/2007, 05/07/2009   Influenza, High Dose Seasonal PF 01/17/2018, 01/30/2020, 01/05/2021   Influenza,inj,Quad PF,6+ Mos 01/12/2016, 12/31/2018   Influenza-Unspecified 02/22/2014   PFIZER(Purple Top)SARS-COV-2 Vaccination 05/20/2019, 06/10/2019, 08/24/2020   Pneumococcal Conjugate-13 01/12/2016   Pneumococcal Polysaccharide-23 01/13/2013, 07/15/2020   Td 12/19/2007   Tdap 12/19/2007, 11/09/2020   Zoster, Live 03/16/2016    TDAP status: Up to date  Flu Vaccine status: Due, Education has been provided regarding the importance of this vaccine. Advised may receive this vaccine at local pharmacy or Health Dept. Aware to provide a copy of the vaccination record if obtained from local pharmacy or Health Dept. Verbalized acceptance and understanding.  Pneumococcal vaccine status: Up to date  Covid-19  vaccine status: Completed vaccines  Qualifies for Shingles Vaccine? Yes   Zostavax completed Yes   Shingrix Completed?: No.    Education has been provided regarding the importance of this vaccine. Patient has been advised to call insurance company to determine out of pocket expense if they have not yet received this vaccine. Advised may  also receive vaccine at local pharmacy or Health Dept. Verbalized acceptance and understanding.  Screening Tests Health Maintenance  Topic Date Due   Zoster Vaccines- Shingrix (1 of 2) 10/21/1998   Diabetic kidney evaluation - Urine ACR  08/22/2014   COVID-19 Vaccine (7 - 2023-24 season) 06/18/2022   Diabetic kidney evaluation - eGFR measurement  12/09/2022   INFLUENZA VACCINE  11/30/2022   Medicare Annual Wellness (AWV)  12/29/2023   Colonoscopy  01/13/2026   DTaP/Tdap/Td (5 - Td or Tdap) 11/10/2030   Pneumonia Vaccine 27+ Years old  Completed   Hepatitis C Screening  Completed   HPV VACCINES  Aged Out   FOOT EXAM  Discontinued   HEMOGLOBIN A1C  Discontinued   OPHTHALMOLOGY EXAM  Discontinued    Health Maintenance  Health Maintenance Due  Topic Date Due   Zoster Vaccines- Shingrix (1 of 2) 10/21/1998   Diabetic kidney evaluation - Urine ACR  08/22/2014   COVID-19 Vaccine (7 - 2023-24 season) 06/18/2022   Diabetic kidney evaluation - eGFR measurement  12/09/2022   INFLUENZA VACCINE  11/30/2022    Colorectal cancer screening: Type of screening: Colonoscopy. Completed 01/13/2021. Repeat every 5 years  Lung Cancer Screening: (Low Dose CT Chest recommended if Age 63-80 years, 20 pack-year currently smoking OR have quit w/in 15years.) does not qualify.   Lung Cancer Screening Referral: no  Additional Screening:  Hepatitis C Screening: does qualify; Completed 03/16/2016  Vision Screening: Recommended annual ophthalmology exams for early detection of glaucoma and other disorders of the eye. Is the patient up to date with their annual eye exam?  Yes  Who is the provider or what is the name of the office in which the patient attends annual eye exams? [facility] If pt is not established with a provider, would they like to be referred to a provider to establish care? No .   Dental Screening: Recommended annual dental exams for proper oral hygiene  Diabetic Foot Exam:  n/a  Community Resource Referral / Chronic Care Management: CRR required this visit?  No   CCM required this visit?  No     Plan:     I have personally reviewed and noted the following in the patient's chart:   Medical and social history Use of alcohol, tobacco or illicit drugs  Current medications and supplements including opioid prescriptions. Patient is not currently taking opioid prescriptions. Functional ability and status Nutritional status Physical activity Advanced directives List of other physicians Hospitalizations, surgeries, and ER visits in previous 12 months Vitals Screenings to include cognitive, depression, and falls Referrals and appointments  In addition, I have reviewed and discussed with patient certain preventive protocols, quality metrics, and best practice recommendations. A written personalized care plan for preventive services as well as general preventive health recommendations were provided to patient.     Barb Merino, LPN   [id number]/60/[id number]   After Visit Summary: (MyChart) Due to this being a telephonic visit, the after visit summary with patients personalized plan was offered to patient via MyChart   Nurse Notes: none

## 2022-12-29 NOTE — Patient Instructions (Signed)
David Cannon , Thank you for taking time to come for your Medicare Wellness Visit. I appreciate your ongoing commitment to your health goals. Please review the following plan we discussed and let me know if I can assist you in the future.   Referrals/Orders/Follow-Ups/Clinician Recommendations: none  This is a list of the screening recommended for you and due dates:  Health Maintenance  Topic Date Due   Zoster (Shingles) Vaccine (1 of 2) 10/21/1998   Yearly kidney health urinalysis for diabetes  08/22/2014   COVID-19 Vaccine (7 - 2023-24 season) 06/18/2022   Yearly kidney function blood test for diabetes  12/09/2022   Flu Shot  11/30/2022   Medicare Annual Wellness Visit  12/29/2023   Colon Cancer Screening  01/13/2026   DTaP/Tdap/Td vaccine (5 - Td or Tdap) 11/10/2030   Pneumonia Vaccine  Completed   Hepatitis C Screening  Completed   HPV Vaccine  Aged Out   Complete foot exam   Discontinued   Hemoglobin A1C  Discontinued   Eye exam for diabetics  Discontinued    Advanced directives: (ACP Link)Information on Advanced Care Planning can be found at [facility] of [employer] Advance Health Care Directives Advance Health Care Directives ([email or web address]   Next Medicare Annual Wellness Visit scheduled for next year: Yes  insert Preventive Care attachment Insert FALL PREVENTION attachment if needed

## 2023-01-10 ENCOUNTER — Other Ambulatory Visit: Payer: Self-pay

## 2023-01-10 ENCOUNTER — Telehealth: Payer: Self-pay | Admitting: Adult Health

## 2023-01-10 ENCOUNTER — Encounter: Payer: Self-pay | Admitting: Adult Health

## 2023-01-10 ENCOUNTER — Ambulatory Visit (INDEPENDENT_AMBULATORY_CARE_PROVIDER_SITE_OTHER): Payer: Medicare Other | Admitting: Adult Health

## 2023-01-10 VITALS — BP 100/60 | HR 79 | Temp 98.1°F | Ht 71.0 in | Wt 205.0 lb

## 2023-01-10 DIAGNOSIS — Z0000 Encounter for general adult medical examination without abnormal findings: Secondary | ICD-10-CM

## 2023-01-10 DIAGNOSIS — Z7984 Long term (current) use of oral hypoglycemic drugs: Secondary | ICD-10-CM

## 2023-01-10 DIAGNOSIS — K2100 Gastro-esophageal reflux disease with esophagitis, without bleeding: Secondary | ICD-10-CM | POA: Diagnosis not present

## 2023-01-10 DIAGNOSIS — E119 Type 2 diabetes mellitus without complications: Secondary | ICD-10-CM | POA: Diagnosis not present

## 2023-01-10 DIAGNOSIS — Z23 Encounter for immunization: Secondary | ICD-10-CM | POA: Diagnosis not present

## 2023-01-10 DIAGNOSIS — R051 Acute cough: Secondary | ICD-10-CM | POA: Diagnosis not present

## 2023-01-10 DIAGNOSIS — E785 Hyperlipidemia, unspecified: Secondary | ICD-10-CM | POA: Diagnosis not present

## 2023-01-10 DIAGNOSIS — Z125 Encounter for screening for malignant neoplasm of prostate: Secondary | ICD-10-CM

## 2023-01-10 DIAGNOSIS — I10 Essential (primary) hypertension: Secondary | ICD-10-CM

## 2023-01-10 MED ORDER — METFORMIN HCL 500 MG PO TABS
500.0000 mg | ORAL_TABLET | Freq: Two times a day (BID) | ORAL | 1 refills | Status: DC
Start: 2023-01-10 — End: 2024-02-05

## 2023-01-10 MED ORDER — METFORMIN HCL 500 MG PO TABS
500.0000 mg | ORAL_TABLET | Freq: Two times a day (BID) | ORAL | 1 refills | Status: DC
Start: 2023-01-10 — End: 2023-01-10

## 2023-01-10 MED ORDER — BENZONATATE 200 MG PO CAPS: 200.0000 mg | ORAL_CAPSULE | Freq: Two times a day (BID) | ORAL | 1 refills | Status: DC | PRN

## 2023-01-10 MED ORDER — OMEPRAZOLE 20 MG PO CPDR
20.0000 mg | DELAYED_RELEASE_CAPSULE | Freq: Every day | ORAL | 3 refills | Status: DC | PRN
Start: 2023-01-10 — End: 2023-12-27

## 2023-01-10 MED ORDER — OLMESARTAN MEDOXOMIL-HCTZ 20-12.5 MG PO TABS
1.0000 | ORAL_TABLET | Freq: Every day | ORAL | 3 refills | Status: DC
Start: 2023-01-10 — End: 2023-12-27

## 2023-01-10 NOTE — Telephone Encounter (Signed)
This has been taking care of.

## 2023-01-10 NOTE — Progress Notes (Signed)
Subjective:    Patient ID: David Cannon, male    DOB: 1950-11-23, 72 y.o.   MRN: [id number]  HPI  Patient presents for yearly preventative medicine examination. He is a pleasant 72 year old male who  has a past medical history of Diabetes mellitus without complication (HCC), Hyperlipidemia, Hypertension, and Personal history of colonic polyps (03/08/2001).  He has primary care at the [facility] as well.   Hypertension -managed with Benicar HCT 20-12.5mg .  His blood pressures have been well-controlled in the past.  He denies dizziness, lightheadedness, chest pain, shortness of breath BP Readings from Last 3 Encounters:  01/10/23 100/60  12/29/22 112/62  12/26/21 113/62   Hyperlipidemia -currently prescribed crestor 10 mg daily - he stopped the medication due to cramping in his legs. S Lab Results  Component Value Date   CHOL 162 12/08/2021   HDL 43.50 12/08/2021   LDLCALC 103 (H) 12/08/2021   TRIG 74.0 12/08/2021   CHOLHDL 4 12/08/2021   DM Type 2  - managed with metformin 500 mg BIDy. He had his A1c checked in July at the [place] with a readings of 6.3 Lab Results  Component Value Date   HGBA1C 6.5 12/08/2021   GERD- takes Prilosec 20 mg daily- feels controlled.   Post Viral Cough - believes he had Covid 19 a few weeks ago. He has had a dry cough since then. Denies fevers or chills. He used his wifes Tessalon and these worked well for him. He is wondering if he can have a prescription for this.    All immunizations and health maintenance protocols were reviewed with the patient and needed orders were placed.  Appropriate screening laboratory values were ordered for the patient including screening of hyperlipidemia, renal function and hepatic function. If indicated by BPH, a PSA was ordered.  Medication reconciliation,  past medical history, social history, problem list and allergies were reviewed in detail with the patient  Goals were established with regard to weight loss, exercise,  and  diet in compliance with medications  Wt Readings from Last 3 Encounters:  01/10/23 205 lb (93 kg)  12/26/21 211 lb (95.7 kg)  12/08/21 211 lb (95.7 kg)   He is up to date on routine colon cancer screening.   Review of Systems  Constitutional: Negative.   HENT: Negative.    Eyes: Negative.   Respiratory:  Positive for cough.   Cardiovascular: Negative.   Gastrointestinal: Negative.   Endocrine: Negative.   Genitourinary: Negative.   Musculoskeletal: Negative.   Skin: Negative.   Allergic/Immunologic: Negative.   Neurological: Negative.   Hematological: Negative.   Psychiatric/Behavioral: Negative.    All other systems reviewed and are negative.  Past Medical History:  Diagnosis Date   Diabetes mellitus without complication (HCC)    Hyperlipidemia    Hypertension    Personal history of colonic polyps 03/08/2001    Social History   Socioeconomic History   Marital status: Married    Spouse name: Not on file   Number of children: Not on file   Years of education: Not on file   Highest education level: Not on file  Occupational History   Not on file  Tobacco Use   Smoking status: Never    Passive exposure: Past (mother smoked in home when a child)   Smokeless tobacco: Never  Vaping Use   Vaping status: Never Used  Substance and Sexual Activity   Alcohol use: Yes    Comment: once every 3 months  Drug use: No   Sexual activity: Not on file  Other Topics Concern   Not on file  Social History Narrative   Retired from working at the post [employer]    Married    Daughter and step son   Three grand children       He likes to go to [employer] and spending time with his wife.       Social Determinants of Health   Financial Resource Strain: Low Risk  (12/29/2022)   Overall Financial Resource Strain (CARDIA)    Difficulty of Paying Living Expenses: Not hard at all  Food Insecurity: No Food Insecurity (12/29/2022)   Hunger Vital Sign    Worried About Running Out  of Food in the Last Year: Never true    Ran Out of Food in the Last Year: Never true  Transportation Needs: No Transportation Needs (12/29/2022)   PRAPARE - Administrator, Civil Service (Medical): No    Lack of Transportation (Non-Medical): No  Physical Activity: Insufficiently Active (12/29/2022)   Exercise Vital Sign    Days of Exercise per Week: 2 days    Minutes of Exercise per Session: 20 min  Stress: No Stress Concern Present (12/29/2022)   [employer] of Occupational Health - Occupational Stress Questionnaire    Feeling of Stress : Not at all  Social Connections: Socially Integrated (12/29/2022)   Social Connection and Isolation Panel [NHANES]    Frequency of Communication with Friends and Family: More than three times a week    Frequency of Social Gatherings with Friends and Family: More than three times a week    Attends Religious Services: More than 4 times per year    Active Member of [employer] or Organizations: Yes    Attends Engineer, structural: More than 4 times per year    Marital Status: Married  Catering manager Violence: Not At Risk (12/29/2022)   Humiliation, Afraid, Rape, and Kick questionnaire    Fear of Current or Ex-Partner: No    Emotionally Abused: No    Physically Abused: No    Sexually Abused: No    Past Surgical History:  Procedure Laterality Date   COLONOSCOPY W/ POLYPECTOMY      Family History  Problem Relation Age of Onset   Lung cancer Mother        lung   Prostate cancer Father    Cancer Maternal Grandfather        colon   Colon cancer Maternal Grandfather    Coronary artery disease Neg Hx    Colon polyps Neg Hx    Rectal cancer Neg Hx    Stomach cancer Neg Hx    Esophageal cancer Neg Hx     Allergies  Allergen Reactions   Crestor [Rosuvastatin]     Muscle cramps    Simvastatin     Other reaction(s): Muscle pain    Current Outpatient Medications on File Prior to Visit  Medication Sig Dispense Refill    latanoprost (XALATAN) 0.005 % ophthalmic solution Apply to eye.     metFORMIN (GLUCOPHAGE) 500 MG tablet Take 1 tablet by mouth once daily with breakfast 90 tablet 0   Multiple Vitamins-Minerals (MULTIVITAMIN ADULTS 50+ PO) Take by mouth.     omeprazole (PRILOSEC) 20 MG capsule Take 1 capsule by mouth daily as needed.     olmesartan-hydrochlorothiazide (BENICAR HCT) 20-12.5 MG tablet Take 1 tablet by mouth once daily 90 tablet 0   No current facility-administered medications  on file prior to visit.    BP 100/60   Pulse 79   Temp 98.1 F (36.7 C) (Oral)   Ht 5\' 11"  (1.803 m)   Wt 205 lb (93 kg)   SpO2 97%   BMI 28.59 kg/m       Objective:   Physical Exam Vitals and nursing note reviewed.  Constitutional:      General: He is not in acute distress.    Appearance: Normal appearance. He is not ill-appearing.  HENT:     Head: Normocephalic and atraumatic.     Right Ear: Tympanic membrane, ear canal and external ear normal. There is no impacted cerumen.     Left Ear: Tympanic membrane, ear canal and external ear normal. There is no impacted cerumen.     Nose: Nose normal. No congestion or rhinorrhea.     Mouth/Throat:     Mouth: Mucous membranes are moist.     Pharynx: Oropharynx is clear.  Eyes:     Extraocular Movements: Extraocular movements intact.     Conjunctiva/sclera: Conjunctivae normal.     Pupils: Pupils are equal, round, and reactive to light.  Neck:     Vascular: No carotid bruit.  Cardiovascular:     Rate and Rhythm: Normal rate and regular rhythm.     Pulses: Normal pulses.     Heart sounds: No murmur heard.    No friction rub. No gallop.  Pulmonary:     Effort: Pulmonary effort is normal.     Breath sounds: Normal breath sounds.  Abdominal:     General: Abdomen is flat. Bowel sounds are normal. There is no distension.     Palpations: Abdomen is soft. There is no mass.     Tenderness: There is no abdominal tenderness. There is no guarding or rebound.      Hernia: No hernia is present.  Musculoskeletal:        General: Normal range of motion.     Cervical back: Normal range of motion and neck supple.  Lymphadenopathy:     Cervical: No cervical adenopathy.  Skin:    General: Skin is warm and dry.     Capillary Refill: Capillary refill takes less than 2 seconds.  Neurological:     General: No focal deficit present.     Mental Status: He is alert and oriented to person, place, and time.  Psychiatric:        Mood and Affect: Mood normal.        Behavior: Behavior normal.        Thought Content: Thought content normal.        Judgment: Judgment normal.       Assessment & Plan:   1. Routine general medical examination at a health care facility Today patient counseled on age appropriate routine health concerns for screening and prevention, each reviewed and up to date or declined. Immunizations reviewed and up to date or declined. Labs ordered and reviewed. Risk factors for depression reviewed and negative. Hearing function and visual acuity are intact. ADLs screened and addressed as needed. Functional ability and level of safety reviewed and appropriate. Education, counseling and referrals performed based on assessed risks today. Patient provided with a copy of personalized plan for preventive services. - Follow up in one year or sooner if needed - Flu shot given today  - Continue to eat healthy and exercise   2. Hyperlipidemia, unspecified hyperlipidemia type - Consider Zetia  - Lipid panel; Future - CBC; Future -  Comprehensive metabolic panel; Future  3. Essential hypertension - Well controlled. No change in medication  - Lipid panel; Future - CBC; Future - Comprehensive metabolic panel; Future - olmesartan-hydrochlorothiazide (BENICAR HCT) 20-12.5 MG tablet; Take 1 tablet by mouth daily.  Dispense: 90 tablet; Refill: 3  4. Diabetes mellitus treated with oral medication (HCC) - Continue with Metformin 500 mg BID  - Follow up in 6  months  - Lipid panel; Future - CBC; Future - Comprehensive metabolic panel; Future - Hemoglobin A1c; Future - Microalbumin/Creatinine Ratio, Urine; Future - metFORMIN (GLUCOPHAGE) 500 MG tablet; Take 1 tablet (500 mg total) by mouth 2 (two) times daily with a meal. Take 1 tablet by mouth once daily with breakfast  Dispense: 180 tablet; Refill: 1  5. Prostate cancer screening  - PSA; Future  6. Gastroesophageal reflux disease with esophagitis without hemorrhage  - omeprazole (PRILOSEC) 20 MG capsule; Take 1 capsule (20 mg total) by mouth daily as needed.  Dispense: 90 capsule; Refill: 3  7. Acute cough  - benzonatate (TESSALON) 200 MG capsule; Take 1 capsule (200 mg total) by mouth 2 (two) times daily as needed for cough.  Dispense: 20 capsule; Refill: 1   Shirline Frees, NP

## 2023-01-10 NOTE — Patient Instructions (Signed)
It was great seeing you today   We will follow up with you regarding your lab work   Please let me know if you need anything   

## 2023-01-10 NOTE — Telephone Encounter (Signed)
Pharmacist at [facility] called to request clarification regarding the Metformin directions. CMA was unavailable. Please call back at your earliest convenience.    [facility] [address] [place], [place] - [address] Phone: [phone]  Fax: [phone]

## 2023-01-11 ENCOUNTER — Other Ambulatory Visit (INDEPENDENT_AMBULATORY_CARE_PROVIDER_SITE_OTHER): Payer: Medicare Other

## 2023-01-11 DIAGNOSIS — I10 Essential (primary) hypertension: Secondary | ICD-10-CM | POA: Diagnosis not present

## 2023-01-11 DIAGNOSIS — E119 Type 2 diabetes mellitus without complications: Secondary | ICD-10-CM

## 2023-01-11 DIAGNOSIS — E785 Hyperlipidemia, unspecified: Secondary | ICD-10-CM | POA: Diagnosis not present

## 2023-01-11 DIAGNOSIS — Z125 Encounter for screening for malignant neoplasm of prostate: Secondary | ICD-10-CM

## 2023-01-11 DIAGNOSIS — Z7984 Long term (current) use of oral hypoglycemic drugs: Secondary | ICD-10-CM | POA: Diagnosis not present

## 2023-01-11 LAB — CBC
HCT: 40.8 % (ref 39.0–52.0)
Hemoglobin: 13 g/dL (ref 13.0–17.0)
MCHC: 31.8 g/dL (ref 30.0–36.0)
MCV: 73.9 fl — ABNORMAL LOW (ref 78.0–100.0)
Platelets: 208 10*3/uL (ref 150.0–400.0)
RBC: 5.51 Mil/uL (ref 4.22–5.81)
RDW: 17.1 % — ABNORMAL HIGH (ref 11.5–15.5)
WBC: 4.7 10*3/uL (ref 4.0–10.5)

## 2023-01-11 LAB — COMPREHENSIVE METABOLIC PANEL
ALT: 26 U/L (ref 0–53)
AST: 20 U/L (ref 0–37)
Albumin: 4.1 g/dL (ref 3.5–5.2)
Alkaline Phosphatase: 46 U/L (ref 39–117)
BUN: 19 mg/dL (ref 6–23)
CO2: 29 meq/L (ref 19–32)
Calcium: 9.4 mg/dL (ref 8.4–10.5)
Chloride: 104 meq/L (ref 96–112)
Creatinine, Ser: 1.32 mg/dL (ref 0.40–1.50)
GFR: 53.24 mL/min — ABNORMAL LOW (ref >60.00)
Glucose, Bld: 84 mg/dL (ref 70–99)
Potassium: 4.6 meq/L (ref 3.5–5.1)
Sodium: 140 meq/L (ref 135–145)
Total Bilirubin: 0.5 mg/dL (ref 0.2–1.2)
Total Protein: 7.2 g/dL (ref 6.0–8.3)

## 2023-01-11 LAB — MICROALBUMIN / CREATININE URINE RATIO
Creatinine,U: 127.5 mg/dL
Microalb Creat Ratio: 0.5 mg/g (ref 0.0–30.0)
Microalb, Ur: <0.7 mg/dL (ref 0.0–1.9)

## 2023-01-11 LAB — LIPID PANEL
Cholesterol: 158 mg/dL (ref 0–200)
HDL: 37.5 mg/dL — ABNORMAL LOW (ref >39.00)
LDL Cholesterol: 96 mg/dL (ref 0–99)
NonHDL: 120.94
Total CHOL/HDL Ratio: 4
Triglycerides: 125 mg/dL (ref 0.0–149.0)
VLDL: 25 mg/dL (ref 0.0–40.0)

## 2023-01-11 LAB — PSA: PSA: 0.6 ng/mL (ref 0.10–4.00)

## 2023-01-11 LAB — HEMOGLOBIN A1C: Hgb A1c MFr Bld: 6.3 % (ref 4.6–6.5)

## 2023-01-11 NOTE — Addendum Note (Signed)
Addended by: Waymon Amato R on: 01/11/2023 12:16 PM   Modules accepted: Orders

## 2023-01-28 ENCOUNTER — Other Ambulatory Visit: Payer: Self-pay | Admitting: Adult Health

## 2023-01-28 DIAGNOSIS — N529 Male erectile dysfunction, unspecified: Secondary | ICD-10-CM

## 2023-01-30 ENCOUNTER — Telehealth: Payer: Self-pay | Admitting: Adult Health

## 2023-01-30 NOTE — Telephone Encounter (Signed)
Pt was last seen on 01/10/23.  Pt says Metformin may be affecting his kidneys. Pt stated no one ever called him back to go over his labs.  Please return Pt's call, at your earliest convenience.

## 2023-01-31 NOTE — Telephone Encounter (Signed)
Please advise 

## 2023-02-01 NOTE — Telephone Encounter (Signed)
Patient notified of update  and verbalized understanding. 

## 2023-02-13 ENCOUNTER — Other Ambulatory Visit: Payer: Self-pay | Admitting: Adult Health

## 2023-02-13 DIAGNOSIS — N529 Male erectile dysfunction, unspecified: Secondary | ICD-10-CM

## 2023-02-13 DIAGNOSIS — E785 Hyperlipidemia, unspecified: Secondary | ICD-10-CM

## 2023-02-15 ENCOUNTER — Other Ambulatory Visit: Payer: Self-pay

## 2023-02-15 DIAGNOSIS — N529 Male erectile dysfunction, unspecified: Secondary | ICD-10-CM

## 2023-02-15 MED ORDER — SILDENAFIL CITRATE 20 MG PO TABS: ORAL_TABLET | ORAL | 0 refills | Status: DC

## 2023-02-15 MED ORDER — SILDENAFIL CITRATE 20 MG PO TABS
ORAL_TABLET | ORAL | 0 refills | Status: DC
Start: 2023-02-15 — End: 2024-01-23

## 2023-02-27 DIAGNOSIS — E119 Type 2 diabetes mellitus without complications: Secondary | ICD-10-CM | POA: Diagnosis not present

## 2023-05-23 DIAGNOSIS — H401131 Primary open-angle glaucoma, bilateral, mild stage: Secondary | ICD-10-CM | POA: Diagnosis not present

## 2023-06-11 DIAGNOSIS — H401131 Primary open-angle glaucoma, bilateral, mild stage: Secondary | ICD-10-CM | POA: Diagnosis not present

## 2023-06-21 ENCOUNTER — Ambulatory Visit: Payer: Self-pay | Admitting: Adult Health

## 2023-06-21 ENCOUNTER — Ambulatory Visit: Payer: Medicare Other | Admitting: Internal Medicine

## 2023-06-21 ENCOUNTER — Encounter: Payer: Self-pay | Admitting: Internal Medicine

## 2023-06-21 VITALS — BP 106/70 | HR 99 | Temp 99.9°F | Ht 71.0 in | Wt 209.6 lb

## 2023-06-21 DIAGNOSIS — J22 Unspecified acute lower respiratory infection: Secondary | ICD-10-CM

## 2023-06-21 DIAGNOSIS — J029 Acute pharyngitis, unspecified: Secondary | ICD-10-CM | POA: Diagnosis not present

## 2023-06-21 DIAGNOSIS — R059 Cough, unspecified: Secondary | ICD-10-CM

## 2023-06-21 DIAGNOSIS — J988 Other specified respiratory disorders: Secondary | ICD-10-CM | POA: Diagnosis not present

## 2023-06-21 DIAGNOSIS — J101 Influenza due to other identified influenza virus with other respiratory manifestations: Secondary | ICD-10-CM

## 2023-06-21 LAB — POCT INFLUENZA A/B
Influenza A, POC: POSITIVE — AB
Influenza B, POC: NEGATIVE

## 2023-06-21 LAB — POCT RAPID STREP A (OFFICE): Rapid Strep A Screen: NEGATIVE

## 2023-06-21 MED ORDER — ALBUTEROL SULFATE HFA 108 (90 BASE) MCG/ACT IN AERS: 2.0000 | INHALATION_SPRAY | Freq: Four times a day (QID) | RESPIRATORY_TRACT | 1 refills | Status: AC | PRN

## 2023-06-21 MED ORDER — AZITHROMYCIN 250 MG PO TABS: ORAL_TABLET | ORAL | 0 refills | Status: AC

## 2023-06-21 NOTE — Patient Instructions (Addendum)
The tests for influenza A is positive today . Do not think an antiviral  will  help the far out from onset of sx.  That is the primary infection which usually resolved on it s own   but  and you are having secondary symptoms  Acting like secondary asthmatic bronchitis  but I do have concern for secondary bacterial infection.  Adding  antibiotic for bacterial resp infection   in case involved .   If not improving in next  week plan contact medical team for fu.  If acutely short of breath also  seek care.

## 2023-06-21 NOTE — Telephone Encounter (Signed)
Copied from CRM [id number]. Topic: Clinical - Red Word Triage >> Jun 21, 2023  7:49 AM David Cannon wrote: Red Word that prompted transfer to Nurse Triage:   Symptoms started on Sunday evening Cough, was wet at first but is now dry. Shortness of breath  History of bronchitis.   Taking Tylenol with Zyrtec, not helping.  Chief Complaint: breathing difficulty Symptoms: cough, SOB at times, wheezing, fever 99.9 with OTC fever reducing medication, body aches Frequency: Sunday evening Pertinent Negatives: Patient denies chest pain  Disposition: [] ED /[] Urgent Care (no appt availability in office) / [x] Appointment(In office/virtual)/ []  [facility]/ [] Home Care/ [] Refused Recommended Disposition /[] [facility]/ []  Follow-up with PCP Additional Notes: pt stated last night he "felt like lungs was closing up" then used old asthma inhaler & was able to get some relief.  Coughing-productive at times, coughing spells which lead to SOB.  Pt stated coughing all night. Office appt made: pt instructed if SOB gets worse, inhaler not helping, etc., not to wait/go to appointment: nurse directed patient in this case go straight to ER. Pt stated he will need new Rx for albuterol  Reason for Disposition  [1] MILD difficulty breathing (e.g., minimal/no SOB at rest, SOB with walking, pulse <100) AND [2] NEW-onset or WORSE than normal  SEVERE coughing spells (e.g., whooping sound after coughing, vomiting after coughing)  Answer Assessment - Initial Assessment Questions 1. RESPIRATORY STATUS: "Describe your breathing?" (e.g., wheezing, shortness of breath, unable to speak, severe coughing)      SOB at times, currently having SOB-unable to complete sentences while talking: had to use inhaler: then was able to clear lungs, wheezing, coughing spells 2. ONSET: "When did this breathing problem begin?"      Sunday 3. PATTERN "Does the difficult breathing come and go, or has it been constant since it  started?"      Comes and goes 4. SEVERITY: "How bad is your breathing?" (e.g., mild, moderate, severe)    - MILD: No SOB at rest, mild SOB with walking, speaks normally in sentences, can lie down, no retractions, pulse < 100.    - MODERATE: SOB at rest, SOB with minimal exertion and prefers to sit, cannot lie down flat, speaks in phrases, mild retractions, audible wheezing, pulse 100-120.    - SEVERE: Very SOB at rest, speaks in single words, struggling to breathe, sitting hunched forward, retractions, pulse > 120      no 5. RECURRENT SYMPTOM: "Have you had difficulty breathing before?" If Yes, ask: "When was the last time?" and "What happened that time?"      no 6. CARDIAC HISTORY: "Do you have any history of heart disease?" (e.g., heart attack, angina, bypass surgery, angioplasty)      no 7. LUNG HISTORY: "Do you have any history of lung disease?"  (e.g., pulmonary embolus, asthma, emphysema)     No- but has inhaler and needs a refill 8. CAUSE: "What do you think is causing the breathing problem?"      Hx of bronchitis 9. OTHER SYMPTOMS: "Do you have any other symptoms? (e.g., dizziness, runny nose, cough, chest pain, fever)     Body aches, cough, runny nose, chest congestion, drainage in throat 10. O2 SATURATION MONITOR:  "Do you use an oxygen saturation monitor (pulse oximeter) at home?" If Yes, ask: "What is your reading (oxygen level) today?" "What is your usual oxygen saturation reading?" (e.g., 95%)       N/a 11. PREGNANCY: "Is there any chance  you are pregnant?" "When was your last menstrual period?"       N/a 12. TRAVEL: "Have you traveled out of the country in the last month?" (e.g., travel history, exposures)       N/a  Answer Assessment - Initial Assessment Questions 1. ONSET: "When did the cough begin?"      Sunday evening 2. SEVERITY: "How bad is the cough today?"      Moderate to severe 3. SPUTUM: "Describe the color of your sputum" (none, dry cough; clear, white, yellow,  green)     Was productive but no longer productive 4. HEMOPTYSIS: "Are you coughing up any blood?" If so ask: "How much?" (flecks, streaks, tablespoons, etc.)     no 5. DIFFICULTY BREATHING: "Are you having difficulty breathing?" If Yes, ask: "How bad is it?" (e.g., mild, moderate, severe)    - MILD: No SOB at rest, mild SOB with walking, speaks normally in sentences, can lie down, no retractions, pulse < 100.    - MODERATE: SOB at rest, SOB with minimal exertion and prefers to sit, cannot lie down flat, speaks in phrases, mild retractions, audible wheezing, pulse 100-120.    - SEVERE: Very SOB at rest, speaks in single words, struggling to breathe, sitting hunched forward, retractions, pulse > 120      Moderate at times - using old albuterol inhaler to help 6. FEVER: "Do you have a fever?" If Yes, ask: "What is your temperature, how was it measured, and when did it start?"     99 .9 7. CARDIAC HISTORY: "Do you have any history of heart disease?" (e.g., heart attack, congestive heart failure)      no 8. LUNG HISTORY: "Do you have any history of lung disease?"  (e.g., pulmonary embolus, asthma, emphysema)     no 9. PE RISK FACTORS: "Do you have a history of blood clots?" (or: recent major surgery, recent prolonged travel, bedridden)     no 10. OTHER SYMPTOMS: "Do you have any other symptoms?" (e.g., runny nose, wheezing, chest pain)       Wheezing, runny nose, coughing spells, chest congestion 11. PREGNANCY: "Is there any chance you are pregnant?" "When was your last menstrual period?"       N/a 12. TRAVEL: "Have you traveled out of the country in the last month?" (e.g., travel history, exposures)       N/a  Protocols used: Breathing Difficulty-A-AH, Cough - Acute Non-Productive-A-AH

## 2023-06-21 NOTE — Telephone Encounter (Signed)
Ryle from [facility] pharmacy is calling in regards to the Ventolin inhaler called in by provider. The inhaler is not covered by insurance and would like verbal permission to change to another inhaler that is covered.   CB# [id number]           Copied from CRM [id number]. Topic: Clinical - Prescription Issue >> Jun 21, 2023 11:49 AM Adele Barthel wrote: Reason for CRM:   Ryle from [facility] pharmacy is calling in regards to the Ventolin inhaler called in by provider. The inhaler is not covered by insurance and would like verbal permission to change to another inhaler that is covered.   CB# [id number] Reason for Disposition  Health Information question, no triage required and triager able to answer question  Protocols used: Information Only Call - No Triage-A-AH

## 2023-06-21 NOTE — Telephone Encounter (Signed)
Bring attention to Dr. Fabian Sharp.   Per Dr. Fabian Sharp, okay to change to any Albuterol inhaler that is cover by insurance. Spoke to [employer] with [employer]. Relay message to her.  She verbalized understanding.

## 2023-06-21 NOTE — Progress Notes (Signed)
Chief Complaint  Patient presents with   Cough    Pt c/o coughing since Monday. Did had wet cough but lately has dry cough. Pt reports bodyache, slight headache, fatigue, sore throat, tempt-99.9. took tylenol at 9am and zyrtec.    HPI: David Cannon 73 y.o. come in for sda  appt   Onset  Sunday night 4 days ago of above sx  ; wife also got sicka t thsi same time  She is getting better but he  using tylenol but  and convinced he has bronchitis.  Denies chills documented fever over 100    coughing and feels like when was given inhaler  in past by Dr Artist Pais?  Had old inhaler and used and helped some  but dose cause cough to  totally exhale  Had some productive but that is better  thinks he has "bronchitis"   No hx of asthma but years ago with exercise in cold environs had sob and needed  help  Tends to girt "bronchitis " with rep infection?   ROS: See pertinent positives and negatives per HPI. No cp sob when cough and lays down . No hemoptysis  Had flu vaccine   this year   Past Medical History:  Diagnosis Date   Diabetes mellitus without complication (HCC)    Hyperlipidemia    Hypertension    Personal history of colonic polyps 03/08/2001    Family History  Problem Relation Age of Onset   Lung cancer Mother        lung   Prostate cancer Father    Cancer Maternal Grandfather        colon   Colon cancer Maternal Grandfather    Coronary artery disease Neg Hx    Colon polyps Neg Hx    Rectal cancer Neg Hx    Stomach cancer Neg Hx    Esophageal cancer Neg Hx     Social History   Socioeconomic History   Marital status: Married    Spouse name: Not on file   Number of children: Not on file   Years of education: Not on file   Highest education level: Not on file  Occupational History   Not on file  Tobacco Use   Smoking status: Never    Passive exposure: Past (mother smoked in home when a child)   Smokeless tobacco: Never  Vaping Use   Vaping status: Never Used   Substance and Sexual Activity   Alcohol use: Yes    Comment: once every 3 months   Drug use: No   Sexual activity: Not on file  Other Topics Concern   Not on file  Social History Narrative   Retired from working at the [employer]    Married    Daughter and step son   Three grand children       He likes to go to [employer] and spending time with his wife.       Social Drivers of Corporate investment banker Strain: Low Risk  (12/29/2022)   Overall Financial Resource Strain (CARDIA)    Difficulty of Paying Living Expenses: Not hard at all  Food Insecurity: No Food Insecurity (12/29/2022)   Hunger Vital Sign    Worried About Running Out of Food in the Last Year: Never true    Ran Out of Food in the Last Year: Never true  Transportation Needs: No Transportation Needs (12/29/2022)   PRAPARE - Administrator, Civil Service (Medical):  No    Lack of Transportation (Non-Medical): No  Physical Activity: Insufficiently Active (12/29/2022)   Exercise Vital Sign    Days of Exercise per Week: 2 days    Minutes of Exercise per Session: 20 min  Stress: No Stress Concern Present (12/29/2022)   [employer] of Occupational Health - Occupational Stress Questionnaire    Feeling of Stress : Not at all  Social Connections: Socially Integrated (12/29/2022)   Social Connection and Isolation Panel [NHANES]    Frequency of Communication with Friends and Family: More than three times a week    Frequency of Social Gatherings with Friends and Family: More than three times a week    Attends Religious Services: More than 4 times per year    Active Member of [employer] or Organizations: Yes    Attends Engineer, structural: More than 4 times per year    Marital Status: Married    Outpatient Medications Prior to Visit  Medication Sig Dispense Refill   bimatoprost (LUMIGAN) 0.03 % ophthalmic solution Place 1 drop into both eyes at bedtime.     metFORMIN (GLUCOPHAGE) 500 MG tablet Take 1  tablet (500 mg total) by mouth 2 (two) times daily with a meal. 180 tablet 1   Multiple Vitamins-Minerals (MULTIVITAMIN ADULTS 50+ PO) Take by mouth.     olmesartan-hydrochlorothiazide (BENICAR HCT) 20-12.5 MG tablet Take 1 tablet by mouth daily. 90 tablet 3   omeprazole (PRILOSEC) 20 MG capsule Take 1 capsule (20 mg total) by mouth daily as needed. 90 capsule 3   sildenafil (REVATIO) 20 MG tablet TAKE 2 TO 5 TABLETS BY MOUTH AS NEEDED 20 tablet 0   benzonatate (TESSALON) 200 MG capsule Take 1 capsule (200 mg total) by mouth 2 (two) times daily as needed for cough. (Patient not taking: Reported on 06/21/2023) 20 capsule 1   latanoprost (XALATAN) 0.005 % ophthalmic solution Apply to eye. (Patient not taking: Reported on 06/21/2023)     No facility-administered medications prior to visit.     EXAM:  BP 106/70 (BP Location: Right Arm, Patient Position: Sitting, Cuff Size: Normal)   Pulse 99   Temp 99.9 F (37.7 C) (Oral)   Ht 5\' 11"  (1.803 m)   Wt 209 lb 9.6 oz (95.1 kg)   SpO2 97%   BMI 29.23 kg/m   Body mass index is 29.23 kg/m.  GENERAL: vitals reviewed and listed above, alert, oriented, appears well hydrated and in no acute distress  mild illn non toxic has on mask and ocass cough bronchiutis  had what seemed like laryngo spasm sx ( no stridor)  after a cough and deep breathing g on exam  HEENT: atraumatic, conjunctiva  clear, no obvious abnormalities on inspection of external nose and ears ntm clear  op not  exam ed  NECK: no obvious masses on inspection palpation  LUNGS:  no sig wheezing uncertain left base   cough with deep breathing dry to bronchitis  CV: HRRR, no clubbing cyanosis or  peripheral edema nl cap refill  MS: moves all extremities without noticeable focal  abnormality PSYCH: pleasant and cooperative, no obvious depression or anxiety Lab Results  Component Value Date   WBC 4.7 01/11/2023   HGB 13.0 01/11/2023   HCT 40.8 01/11/2023   PLT 208.0 01/11/2023   GLUCOSE  84 01/11/2023   CHOL 158 01/11/2023   TRIG 125.0 01/11/2023   HDL 37.50 (L) 01/11/2023   LDLCALC 96 01/11/2023   ALT 26 01/11/2023   AST 20  01/11/2023   NA 140 01/11/2023   K 4.6 01/11/2023   CL 104 01/11/2023   CREATININE 1.32 01/11/2023   BUN 19 01/11/2023   CO2 29 01/11/2023   TSH 0.85 12/08/2021   PSA 0.60 01/11/2023   HGBA1C 6.3 01/11/2023   MICROALBUR <0.7 01/11/2023   BP Readings from Last 3 Encounters:  06/21/23 106/70  01/10/23 100/60  12/29/22 112/62   Influenza rapid pos a  ASSESSMENT AND PLAN:  Discussed the following assessment and plan:  Influenza A - day 4-5 est  Wheezing-associated respiratory infection (WARI)  Cough, unspecified type - Plan: POC Influenza A/B  Sore throat - Plan: POC Influenza A/B, POC Rapid Strep A  Lower respiratory infection (e.g., bronchitis, pneumonia, pneumonitis, pulmonitis)  Non toxic   concern about risk of  secondary  infection coming into weekend     Expectant management.  albuterol inhaler aiir way  hygiene fluids   Empiric  azithromycin  Comfort cough   otc mucinex dm ok   Close fu appropriate  -Patient advised to return or notify health care team  if  new concerns arise.  Patient Instructions  The tests for influenza A is positive today . Do not think an antiviral  will  help the far out from onset of sx.  That is the primary infection which usually resolved on it s own   but  and you are having secondary symptoms  Acting like secondary asthmatic bronchitis  but I do have concern for secondary bacterial infection.  Adding  antibiotic for bacterial resp infection   in case involved .   If not improving in next  week plan contact medical team for fu.  If acutely short of breath also  seek care.     Neta Mends. Sisto Granillo M.D.

## 2023-06-27 ENCOUNTER — Encounter: Payer: Self-pay | Admitting: Adult Health

## 2023-06-27 ENCOUNTER — Telehealth (INDEPENDENT_AMBULATORY_CARE_PROVIDER_SITE_OTHER): Payer: Medicare Other | Admitting: Adult Health

## 2023-06-27 VITALS — Ht 71.0 in | Wt 208.0 lb

## 2023-06-27 DIAGNOSIS — R058 Other specified cough: Secondary | ICD-10-CM | POA: Diagnosis not present

## 2023-06-27 MED ORDER — GUAIFENESIN-CODEINE 100-10 MG/5ML PO SOLN: 5.0000 mL | Freq: Three times a day (TID) | ORAL | 0 refills | Status: DC | PRN

## 2023-06-27 MED ORDER — PREDNISONE 20 MG PO TABS: 20.0000 mg | ORAL_TABLET | Freq: Every day | ORAL | 0 refills | Status: AC

## 2023-06-27 NOTE — Progress Notes (Signed)
 Virtual Visit via Video Note  I connected with David Cannon on 06/27/23 at  7:30 AM EST by a video enabled telemedicine application and verified that I am speaking with the correct person using two identifiers.  Location patient: home Location provider:work or home office Persons participating in the virtual visit: patient, provider  I discussed the limitations of evaluation and management by telemedicine and the availability of in person appointments. The patient expressed understanding and agreed to proceed.   HPI: 73 year old male who  has a past medical history of Diabetes mellitus without complication (HCC), Hyperlipidemia, Hypertension, and Personal history of colonic polyps (03/08/2001).  He was recently diagnosed with influenza A about 6 days ago. He was treated with a Z pack for suspected secondary bacterial infection.  He is feeling better overall but continues to have a semi productive and wheezing. Cough is keeping him up at night. In the past we have used guaifenesin with codeine to help with his cough after a respiratory infection and he feels as though this works well for him.    ROS: See pertinent positives and negatives per HPI.  Past Medical History:  Diagnosis Date   Diabetes mellitus without complication (HCC)    Hyperlipidemia    Hypertension    Personal history of colonic polyps 03/08/2001    Past Surgical History:  Procedure Laterality Date   COLONOSCOPY W/ POLYPECTOMY      Family History  Problem Relation Age of Onset   Lung cancer Mother        lung   Prostate cancer Father    Cancer Maternal Grandfather        colon   Colon cancer Maternal Grandfather    Coronary artery disease Neg Hx    Colon polyps Neg Hx    Rectal cancer Neg Hx    Stomach cancer Neg Hx    Esophageal cancer Neg Hx        Current Outpatient Medications:    albuterol (VENTOLIN HFA) 108 (90 Base) MCG/ACT inhaler, Inhale 2 puffs into the lungs every 6 (six) hours as needed  for wheezing or shortness of breath., Disp: 8 g, Rfl: 1   benzonatate (TESSALON) 200 MG capsule, Take 1 capsule (200 mg total) by mouth 2 (two) times daily as needed for cough., Disp: 20 capsule, Rfl: 1   bimatoprost (LUMIGAN) 0.03 % ophthalmic solution, Place 1 drop into both eyes at bedtime., Disp: , Rfl:    latanoprost (XALATAN) 0.005 % ophthalmic solution, Apply to eye., Disp: , Rfl:    metFORMIN (GLUCOPHAGE) 500 MG tablet, Take 1 tablet (500 mg total) by mouth 2 (two) times daily with a meal., Disp: 180 tablet, Rfl: 1   Multiple Vitamins-Minerals (MULTIVITAMIN ADULTS 50+ PO), Take by mouth., Disp: , Rfl:    olmesartan-hydrochlorothiazide (BENICAR HCT) 20-12.5 MG tablet, Take 1 tablet by mouth daily., Disp: 90 tablet, Rfl: 3   omeprazole (PRILOSEC) 20 MG capsule, Take 1 capsule (20 mg total) by mouth daily as needed., Disp: 90 capsule, Rfl: 3   sildenafil (REVATIO) 20 MG tablet, TAKE 2 TO 5 TABLETS BY MOUTH AS NEEDED, Disp: 20 tablet, Rfl: 0  EXAM:  VITALS per patient if applicable:  GENERAL: alert, oriented, appears well and in no acute distress  HEENT: atraumatic, conjunttiva clear, no obvious abnormalities on inspection of external nose and ears  NECK: normal movements of the head and neck  LUNGS: on inspection no signs of respiratory distress, breathing rate appears normal, no obvious gross SOB,  gasping or wheezing  CV: no obvious cyanosis  MS: moves all visible extremities without noticeable abnormality  PSYCH/NEURO: pleasant and cooperative, no obvious depression or anxiety, speech and thought processing grossly intact  ASSESSMENT AND PLAN:  Discussed the following assessment and plan:  1. Post-viral cough syndrome (Primary) - Will use prednisone due to continued wheezing.  - Follow up if not improving/resolved in the next week - predniSONE (DELTASONE) 20 MG tablet; Take 1 tablet (20 mg total) by mouth daily with breakfast for 5 days.  Dispense: 5 tablet; Refill: 0 -  guaiFENesin-codeine 100-10 MG/5ML syrup; Take 5 mLs by mouth 3 (three) times daily as needed for cough.  Dispense: 120 mL; Refill: 0      I discussed the assessment and treatment plan with the patient. The patient was provided an opportunity to ask questions and all were answered. The patient agreed with the plan and demonstrated an understanding of the instructions.   The patient was advised to call back or seek an in-person evaluation if the symptoms worsen or if the condition fails to improve as anticipated.   Shirline Frees, NP

## 2023-07-03 ENCOUNTER — Telehealth: Payer: Self-pay

## 2023-07-03 NOTE — Telephone Encounter (Signed)
 Copied from CRM [id number]. Topic: General - Other >> Jul 03, 2023 10:24 AM Aletta Edouard wrote: Reason for CRM: patient wanted to let dr Evelene Croon know that he is feeling better now and anted to thank them for helping him to feel better instantly

## 2023-08-31 DIAGNOSIS — H401131 Primary open-angle glaucoma, bilateral, mild stage: Secondary | ICD-10-CM | POA: Diagnosis not present

## 2023-09-04 DIAGNOSIS — M9904 Segmental and somatic dysfunction of sacral region: Secondary | ICD-10-CM | POA: Diagnosis not present

## 2023-09-04 DIAGNOSIS — M9901 Segmental and somatic dysfunction of cervical region: Secondary | ICD-10-CM | POA: Diagnosis not present

## 2023-09-04 DIAGNOSIS — M542 Cervicalgia: Secondary | ICD-10-CM | POA: Diagnosis not present

## 2023-09-04 DIAGNOSIS — M5032 Other cervical disc degeneration, mid-cervical region, unspecified level: Secondary | ICD-10-CM | POA: Diagnosis not present

## 2023-09-04 DIAGNOSIS — M9903 Segmental and somatic dysfunction of lumbar region: Secondary | ICD-10-CM | POA: Diagnosis not present

## 2023-09-04 DIAGNOSIS — M9905 Segmental and somatic dysfunction of pelvic region: Secondary | ICD-10-CM | POA: Diagnosis not present

## 2023-09-06 DIAGNOSIS — M9905 Segmental and somatic dysfunction of pelvic region: Secondary | ICD-10-CM | POA: Diagnosis not present

## 2023-09-06 DIAGNOSIS — M542 Cervicalgia: Secondary | ICD-10-CM | POA: Diagnosis not present

## 2023-09-06 DIAGNOSIS — M9903 Segmental and somatic dysfunction of lumbar region: Secondary | ICD-10-CM | POA: Diagnosis not present

## 2023-09-06 DIAGNOSIS — M9904 Segmental and somatic dysfunction of sacral region: Secondary | ICD-10-CM | POA: Diagnosis not present

## 2023-09-06 DIAGNOSIS — M9901 Segmental and somatic dysfunction of cervical region: Secondary | ICD-10-CM | POA: Diagnosis not present

## 2023-09-11 DIAGNOSIS — M9903 Segmental and somatic dysfunction of lumbar region: Secondary | ICD-10-CM | POA: Diagnosis not present

## 2023-09-11 DIAGNOSIS — M542 Cervicalgia: Secondary | ICD-10-CM | POA: Diagnosis not present

## 2023-09-11 DIAGNOSIS — M9901 Segmental and somatic dysfunction of cervical region: Secondary | ICD-10-CM | POA: Diagnosis not present

## 2023-09-11 DIAGNOSIS — M5032 Other cervical disc degeneration, mid-cervical region, unspecified level: Secondary | ICD-10-CM | POA: Diagnosis not present

## 2023-09-19 DIAGNOSIS — M542 Cervicalgia: Secondary | ICD-10-CM | POA: Diagnosis not present

## 2023-09-19 DIAGNOSIS — M5032 Other cervical disc degeneration, mid-cervical region, unspecified level: Secondary | ICD-10-CM | POA: Diagnosis not present

## 2023-09-19 DIAGNOSIS — M9901 Segmental and somatic dysfunction of cervical region: Secondary | ICD-10-CM | POA: Diagnosis not present

## 2023-09-19 DIAGNOSIS — M9903 Segmental and somatic dysfunction of lumbar region: Secondary | ICD-10-CM | POA: Diagnosis not present

## 2023-09-26 DIAGNOSIS — M9901 Segmental and somatic dysfunction of cervical region: Secondary | ICD-10-CM | POA: Diagnosis not present

## 2023-09-26 DIAGNOSIS — M5032 Other cervical disc degeneration, mid-cervical region, unspecified level: Secondary | ICD-10-CM | POA: Diagnosis not present

## 2023-09-26 DIAGNOSIS — M9903 Segmental and somatic dysfunction of lumbar region: Secondary | ICD-10-CM | POA: Diagnosis not present

## 2023-09-26 DIAGNOSIS — M542 Cervicalgia: Secondary | ICD-10-CM | POA: Diagnosis not present

## 2023-12-06 DIAGNOSIS — H401131 Primary open-angle glaucoma, bilateral, mild stage: Secondary | ICD-10-CM | POA: Diagnosis not present

## 2023-12-25 ENCOUNTER — Other Ambulatory Visit: Payer: Self-pay | Admitting: Adult Health

## 2023-12-25 DIAGNOSIS — I10 Essential (primary) hypertension: Secondary | ICD-10-CM

## 2023-12-25 DIAGNOSIS — K2100 Gastro-esophageal reflux disease with esophagitis, without bleeding: Secondary | ICD-10-CM

## 2024-01-02 ENCOUNTER — Ambulatory Visit: Payer: Medicare Other

## 2024-01-02 VITALS — Ht 71.0 in | Wt 206.0 lb

## 2024-01-02 DIAGNOSIS — Z0000 Encounter for general adult medical examination without abnormal findings: Secondary | ICD-10-CM | POA: Diagnosis not present
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
## 2024-01-02 NOTE — Patient Instructions (Addendum)
 David Cannon , Thank you for taking time out of your busy schedule to complete your Annual Wellness Visit with me. I enjoyed our conversation and look forward to speaking with you again next year. I, as well as your care team,  appreciate your ongoing commitment to your health goals. Please review the following plan we discussed and let me know if I can assist you in the future. Your Game plan/ To Do List    Referrals: If you haven't heard from the office you've been referred to, please reach out to them at the phone provided.   Follow up Visits: We will see or speak with you next year for your Next Medicare AWV with our clinical staff 01/07/25 @ 10a  Have you seen your provider in the last 6 months (3 months if uncontrolled diabetes)?   Clinician Recommendations:  Aim for 30 minutes of exercise or brisk walking, 6-8 glasses of water, and 5 servings of fruits and vegetables each day.       This is a list of the screenings recommended for you:  Health Maintenance  Topic Date Due   Zoster (Shingles) Vaccine (1 of 2) 10/21/1998   Yearly kidney health urinalysis for diabetes  05/07/2010   Flu Shot  11/30/2023   COVID-19 Vaccine (8 - 2025-26 season) 12/31/2023   Yearly kidney function blood test for diabetes  01/11/2024   Medicare Annual Wellness Visit  01/01/2025   Colon Cancer Screening  01/13/2026   DTaP/Tdap/Td vaccine (6 - Td or Tdap) 11/10/2030   Pneumococcal Vaccine for age over 22  Completed   Hepatitis C Screening  Completed   HPV Vaccine  Aged Out   Meningitis B Vaccine  Aged Out   Complete foot exam   Discontinued   Hemoglobin A1C  Discontinued   Eye exam for diabetics  Discontinued    Advanced directives: (Declined) Advance directive discussed with you today. Even though you declined this today, please call our office should you change your mind, and we can give you the proper paperwork for you to fill out. Advance Care Planning is important because it:  [x]  Makes sure you  receive the medical care that is consistent with your values, goals, and preferences  [x]  It provides guidance to your family and loved ones and reduces their decisional burden about whether or not they are making the right decisions based on your wishes.  Follow the link provided in your after visit summary or read over the paperwork we have mailed to you to help you started getting your Advance Directives in place. If you need assistance in completing these, please reach out to us  so that we can help you!  See attachments for Preventive Care and Fall Prevention Tips.

## 2024-01-16 DIAGNOSIS — M9901 Segmental and somatic dysfunction of cervical region: Secondary | ICD-10-CM | POA: Diagnosis not present

## 2024-01-16 DIAGNOSIS — M542 Cervicalgia: Secondary | ICD-10-CM | POA: Diagnosis not present

## 2024-01-16 DIAGNOSIS — M5032 Other cervical disc degeneration, mid-cervical region, unspecified level: Secondary | ICD-10-CM | POA: Diagnosis not present

## 2024-01-16 DIAGNOSIS — M9903 Segmental and somatic dysfunction of lumbar region: Secondary | ICD-10-CM | POA: Diagnosis not present

## 2024-01-20 ENCOUNTER — Other Ambulatory Visit: Payer: Self-pay | Admitting: Adult Health

## 2024-01-20 DIAGNOSIS — N529 Male erectile dysfunction, unspecified: Secondary | ICD-10-CM

## 2024-01-25 ENCOUNTER — Telehealth: Payer: Self-pay | Admitting: Adult Health

## 2024-01-25 NOTE — Telephone Encounter (Signed)
 LVM for patient to call back--patient needs to schedule a physical per CRM.

## 2024-01-29 NOTE — Progress Notes (Signed)
   01/29/2024  Patient ID: David Cannon, male   DOB: 1950/08/25, 73 y.o.   MRN: [id number]  Pharmacy Quality Measure Review  Statin Use in Persons with Diabetes (SUPD)  Patient is at risk for failing SUPD metric for 2025.  Chart review shows history of myalgia with simvastatin . Has CPE scheduled with PCP for 03/12/24. Will coordinate with PCP to discuss capturing exclusion code and possible alternatives for control of HLD.  Jon VEAR Lindau, PharmD Clinical Pharmacist [phone]

## 2024-01-30 ENCOUNTER — Encounter: Payer: Self-pay | Admitting: Adult Health

## 2024-01-30 ENCOUNTER — Ambulatory Visit (INDEPENDENT_AMBULATORY_CARE_PROVIDER_SITE_OTHER): Admitting: Adult Health

## 2024-01-30 VITALS — BP 100/60 | HR 62 | Temp 98.0°F | Ht 71.0 in | Wt 212.0 lb

## 2024-01-30 DIAGNOSIS — H9191 Unspecified hearing loss, right ear: Secondary | ICD-10-CM | POA: Diagnosis not present
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
## 2024-02-04 ENCOUNTER — Other Ambulatory Visit: Payer: Self-pay | Admitting: Adult Health

## 2024-02-04 DIAGNOSIS — E119 Type 2 diabetes mellitus without complications: Secondary | ICD-10-CM

## 2024-02-20 NOTE — Progress Notes (Signed)
 David Cannon                                          MRN: [id number]   02/20/2024   The [facility] Quality Team Specialist reviewed this patient medical record for the purposes of chart review for care gap closure. The following were reviewed: chart review for care gap closure-glycemic status assessment and kidney health evaluation for diabetes:eGFR  and uACR. Patient has upcoming visit. Diabetes diagnosis unconfirmed but the patient is on the gap report so I wanted to send it just in case these labs are indeed indicated.    [facility] Quality Team

## 2024-03-12 ENCOUNTER — Encounter: Admitting: Adult Health

## 2024-03-21 LAB — OPHTHALMOLOGY REPORT-SCANNED

## 2024-03-25 ENCOUNTER — Other Ambulatory Visit: Payer: Self-pay | Admitting: Adult Health

## 2024-03-25 DIAGNOSIS — K2100 Gastro-esophageal reflux disease with esophagitis, without bleeding: Secondary | ICD-10-CM

## 2024-03-25 DIAGNOSIS — I10 Essential (primary) hypertension: Secondary | ICD-10-CM

## 2024-04-02 ENCOUNTER — Encounter: Payer: Self-pay | Admitting: Adult Health

## 2024-04-02 ENCOUNTER — Ambulatory Visit: Admitting: Adult Health

## 2024-04-02 VITALS — BP 140/80 | HR 82 | Temp 98.2°F | Ht 71.0 in | Wt 215.0 lb

## 2024-04-02 DIAGNOSIS — H6122 Impacted cerumen, left ear: Secondary | ICD-10-CM

## 2024-04-02 NOTE — Progress Notes (Signed)
 12  Subjective:    Patient ID: David Cannon, male    DOB: 30-Apr-1951, 73 y.o.   MRN: [id number]  HPI 73 year old male who  has a past medical history of Diabetes mellitus without complication (HCC), Hyperlipidemia, Hypertension, and Personal history of colonic polyps (03/08/2001).  He presents to the office today for concern of cerumen impaction in his left ear. He reports that he went to look at getting hearing aids and was told that he had ear wax in his left ear that needed to be removed first.    Review of Systems See HPI   Past Medical History:  Diagnosis Date   Diabetes mellitus without complication (HCC)    Hyperlipidemia    Hypertension    Personal history of colonic polyps 03/08/2001    Social History   Socioeconomic History   Marital status: Married    Spouse name: Not on file   Number of children: Not on file   Years of education: Not on file   Highest education level: Not on file  Occupational History   Not on file  Tobacco Use   Smoking status: Never    Passive exposure: Past (mother smoked in home when a child)   Smokeless tobacco: Never  Vaping Use   Vaping status: Never Used  Substance and Sexual Activity   Alcohol use: Yes    Comment: once every 3 months   Drug use: No   Sexual activity: Not on file  Other Topics Concern   Not on file  Social History Narrative   Retired from working at the [employer]    Married    Daughter and step son   Three grand children       He likes to go to [employer] and spending time with his wife.       Social Drivers of Corporate Investment Banker Strain: Low Risk  (01/02/2024)   Overall Financial Resource Strain (CARDIA)    Difficulty of Paying Living Expenses: Not hard at all  Food Insecurity: No Food Insecurity (01/02/2024)   Hunger Vital Sign    Worried About Running Out of Food in the Last Year: Never true    Ran Out of Food in the Last Year: Never true  Transportation Needs: No Transportation Needs  (01/02/2024)   PRAPARE - Administrator, Civil Service (Medical): No    Lack of Transportation (Non-Medical): No  Physical Activity: Insufficiently Active (01/02/2024)   Exercise Vital Sign    Days of Exercise per Week: 3 days    Minutes of Exercise per Session: 30 min  Stress: No Stress Concern Present (01/02/2024)   [employer] of Occupational Health - Occupational Stress Questionnaire    Feeling of Stress: Not at all  Social Connections: Socially Integrated (01/02/2024)   Social Connection and Isolation Panel    Frequency of Communication with Friends and Family: More than three times a week    Frequency of Social Gatherings with Friends and Family: More than three times a week    Attends Religious Services: More than 4 times per year    Active Member of [employer] or Organizations: Yes    Attends Engineer, Structural: More than 4 times per year    Marital Status: Married  Catering Manager Violence: Not At Risk (01/02/2024)   Humiliation, Afraid, Rape, and Kick questionnaire    Fear of Current or Ex-Partner: No    Emotionally Abused: No    Physically Abused:  No    Sexually Abused: No    Past Surgical History:  Procedure Laterality Date   COLONOSCOPY W/ POLYPECTOMY      Family History  Problem Relation Age of Onset   Lung cancer Mother        lung   Prostate cancer Father    Cancer Maternal Grandfather        colon   Colon cancer Maternal Grandfather    Coronary artery disease Neg Hx    Colon polyps Neg Hx    Rectal cancer Neg Hx    Stomach cancer Neg Hx    Esophageal cancer Neg Hx     Allergies  Allergen Reactions   Crestor  [Rosuvastatin ]     Muscle cramps    Simvastatin      Other reaction(s): Muscle pain    Current Outpatient Medications on File Prior to Visit  Medication Sig Dispense Refill   albuterol  (VENTOLIN  HFA) 108 (90 Base) MCG/ACT inhaler Inhale 2 puffs into the lungs every 6 (six) hours as needed for wheezing or shortness of breath. 8  g 1   bimatoprost (LUMIGAN) 0.03 % ophthalmic solution Place 1 drop into both eyes at bedtime.     latanoprost (XALATAN) 0.005 % ophthalmic solution Apply to eye.     metFORMIN  (GLUCOPHAGE ) 500 MG tablet TAKE 1 TABLET BY MOUTH TWICE DAILY WITH A MEAL 180 tablet 0   Multiple Vitamins-Minerals (MULTIVITAMIN ADULTS 50+ PO) Take by mouth.     olmesartan -hydrochlorothiazide  (BENICAR  HCT) 20-12.5 MG tablet Take 1 tablet by mouth once daily 90 tablet 0   omeprazole  (PRILOSEC) 20 MG capsule TAKE 1 CAPSULE BY MOUTH ONCE DAILY AS NEEDED 90 capsule 0   sildenafil  (REVATIO ) 20 MG tablet TAKE 2 TO 5 TABLETS BY MOUTH AS NEEDED 20 tablet 0   benzonatate  (TESSALON ) 200 MG capsule Take 1 capsule (200 mg total) by mouth 2 (two) times daily as needed for cough. (Patient not taking: Reported on 04/02/2024) 20 capsule 1   guaiFENesin -codeine  100-10 MG/5ML syrup Take 5 mLs by mouth 3 (three) times daily as needed for cough. (Patient not taking: Reported on 04/02/2024) 120 mL 0   No current facility-administered medications on file prior to visit.    BP (!) 140/80   Pulse 82   Temp 98.2 F (36.8 C) (Oral)   Ht 5' 11 (1.803 m)   Wt 215 lb (97.5 kg)   SpO2 97%   BMI 29.99 kg/m       Objective:   Physical Exam Vitals and nursing note reviewed.  Constitutional:      Appearance: Normal appearance.  HENT:     Left Ear: There is impacted cerumen.  Musculoskeletal:        General: Normal range of motion.  Skin:    General: Skin is warm.  Neurological:     General: No focal deficit present.     Mental Status: He is alert and oriented to person, place, and time.  Psychiatric:        Mood and Affect: Mood normal.        Behavior: Behavior normal.        Thought Content: Thought content normal.        Judgment: Judgment normal.        Assessment & Plan:  1. Impacted cerumen of left ear (Primary) - He does have hard ear wax in his left ear. We tried soaking with hydrogen peroxide and water then using  irrigation but the cerumen was not able  to be removed. I then attempted to use a curette but again this was not successful and caused pain. I will have him use Debrox for the next few days and return later in the week for removal   Darleene Shape, NP

## 2024-04-04 ENCOUNTER — Encounter: Payer: Self-pay | Admitting: Adult Health

## 2024-04-04 ENCOUNTER — Ambulatory Visit: Admitting: Adult Health

## 2024-04-04 VITALS — BP 130/82 | HR 56 | Temp 98.2°F | Ht 71.0 in | Wt 215.0 lb

## 2024-04-04 DIAGNOSIS — H6122 Impacted cerumen, left ear: Secondary | ICD-10-CM

## 2024-04-04 NOTE — Progress Notes (Signed)
 Subjective:    Patient ID: David Cannon, male    DOB: [birth year], 73 y.o.   MRN: [id number]  HPI 73 year old male who  has a past medical history of Diabetes mellitus without complication (HCC), Hyperlipidemia, Hypertension, and Personal history of colonic polyps (03/08/2001).  He returns today for cerumen impaction. He was seen earlier this week and cerumen was not able to be removed. He has been using Debrox for the last two days to soften up his ear wax.    Review of Systems See HPI   Past Medical History:  Diagnosis Date   Diabetes mellitus without complication (HCC)    Hyperlipidemia    Hypertension    Personal history of colonic polyps 03/08/2001    Social History   Socioeconomic History   Marital status: Married    Spouse name: Not on file   Number of children: Not on file   Years of education: Not on file   Highest education level: Not on file  Occupational History   Not on file  Tobacco Use   Smoking status: Never    Passive exposure: Past (mother smoked in home when a child)   Smokeless tobacco: Never  Vaping Use   Vaping status: Never Used  Substance and Sexual Activity   Alcohol use: Yes    Comment: once every 3 months   Drug use: No   Sexual activity: Not on file  Other Topics Concern   Not on file  Social History Narrative   Retired from working at the post office    Married    Daughter and step son   Three grand children       He likes to go to [employer] and spending time with his wife.       Social Drivers of Corporate Investment Banker Strain: Low Risk  (01/02/2024)   Overall Financial Resource Strain (CARDIA)    Difficulty of Paying Living Expenses: Not hard at all  Food Insecurity: No Food Insecurity (01/02/2024)   Hunger Vital Sign    Worried About Running Out of Food in the Last Year: Never true    Ran Out of Food in the Last Year: Never true  Transportation Needs: No Transportation Needs (01/02/2024)   PRAPARE - Therapist, Art (Medical): No    Lack of Transportation (Non-Medical): No  Physical Activity: Insufficiently Active (01/02/2024)   Exercise Vital Sign    Days of Exercise per Week: 3 days    Minutes of Exercise per Session: 30 min  Stress: No Stress Concern Present (01/02/2024)   [employer] of Occupational Health - Occupational Stress Questionnaire    Feeling of Stress: Not at all  Social Connections: Socially Integrated (01/02/2024)   Social Connection and Isolation Panel    Frequency of Communication with Friends and Family: More than three times a week    Frequency of Social Gatherings with Friends and Family: More than three times a week    Attends Religious Services: More than 4 times per year    Active Member of [employer] or Organizations: Yes    Attends Banker Meetings: More than 4 times per year    Marital Status: Married  Catering Manager Violence: Not At Risk (01/02/2024)   Humiliation, Afraid, Rape, and Kick questionnaire    Fear of Current or Ex-Partner: No    Emotionally Abused: No    Physically Abused: No    Sexually Abused: No  Past Surgical History:  Procedure Laterality Date   COLONOSCOPY W/ POLYPECTOMY      Family History  Problem Relation Age of Onset   Lung cancer Mother        lung   Prostate cancer Father    Cancer Maternal Grandfather        colon   Colon cancer Maternal Grandfather    Coronary artery disease Neg Hx    Colon polyps Neg Hx    Rectal cancer Neg Hx    Stomach cancer Neg Hx    Esophageal cancer Neg Hx     Allergies  Allergen Reactions   Crestor  [Rosuvastatin ]     Muscle cramps    Simvastatin      Other reaction(s): Muscle pain    Current Outpatient Medications on File Prior to Visit  Medication Sig Dispense Refill   albuterol  (VENTOLIN  HFA) 108 (90 Base) MCG/ACT inhaler Inhale 2 puffs into the lungs every 6 (six) hours as needed for wheezing or shortness of breath. 8 g 1   bimatoprost (LUMIGAN) 0.03 %  ophthalmic solution Place 1 drop into both eyes at bedtime.     latanoprost (XALATAN) 0.005 % ophthalmic solution Apply to eye.     metFORMIN  (GLUCOPHAGE ) 500 MG tablet TAKE 1 TABLET BY MOUTH TWICE DAILY WITH A MEAL 180 tablet 0   Multiple Vitamins-Minerals (MULTIVITAMIN ADULTS 50+ PO) Take by mouth.     olmesartan -hydrochlorothiazide  (BENICAR  HCT) 20-12.5 MG tablet Take 1 tablet by mouth once daily 90 tablet 0   omeprazole  (PRILOSEC) 20 MG capsule TAKE 1 CAPSULE BY MOUTH ONCE DAILY AS NEEDED 90 capsule 0   sildenafil  (REVATIO ) 20 MG tablet TAKE 2 TO 5 TABLETS BY MOUTH AS NEEDED 20 tablet 0   No current facility-administered medications on file prior to visit.    BP 130/82   Pulse (!) 56   Temp 98.2 F (36.8 C) (Oral)   Ht 5' 11 (1.803 m)   Wt 215 lb (97.5 kg)   SpO2 98%   BMI 29.99 kg/m       Objective:   Physical Exam Vitals and nursing note reviewed.  Constitutional:      Appearance: Normal appearance.  HENT:     Right Ear: There is no impacted cerumen.     Left Ear: There is impacted cerumen.  Neurological:     General: No focal deficit present.     Mental Status: He is alert and oriented to person, place, and time.  Psychiatric:        Mood and Affect: Mood normal.        Behavior: Behavior normal.        Thought Content: Thought content normal.        Judgment: Judgment normal.        Assessment & Plan:  1. Impacted cerumen of left ear (Primary) Ear Cerumen Removal  Date/Time: 04/04/2024 10:23 AM  Performed by: Merna Huxley, NP Authorized by: Merna Huxley, NP   Anesthesia: Local Anesthetic: none Ceruminolytics applied: Ceruminolytics applied prior to the procedure. Location details: left ear Patient tolerance: patient tolerated the procedure well with no immediate complications Procedure type: irrigation  Sedation: Patient sedated: no      Huxley Merna, NP

## 2024-04-09 ENCOUNTER — Ambulatory Visit: Payer: Self-pay | Admitting: Adult Health

## 2024-04-09 ENCOUNTER — Ambulatory Visit: Admitting: Adult Health

## 2024-04-09 ENCOUNTER — Encounter: Payer: Self-pay | Admitting: Adult Health

## 2024-04-09 VITALS — BP 110/80 | HR 84 | Temp 98.8°F | Ht 71.0 in | Wt 213.0 lb

## 2024-04-09 DIAGNOSIS — E785 Hyperlipidemia, unspecified: Secondary | ICD-10-CM

## 2024-04-09 DIAGNOSIS — T466X5A Adverse effect of antihyperlipidemic and antiarteriosclerotic drugs, initial encounter: Secondary | ICD-10-CM

## 2024-04-09 DIAGNOSIS — G720 Drug-induced myopathy: Secondary | ICD-10-CM

## 2024-04-09 DIAGNOSIS — I10 Essential (primary) hypertension: Secondary | ICD-10-CM

## 2024-04-09 DIAGNOSIS — E119 Type 2 diabetes mellitus without complications: Secondary | ICD-10-CM

## 2024-04-09 DIAGNOSIS — K2100 Gastro-esophageal reflux disease with esophagitis, without bleeding: Secondary | ICD-10-CM

## 2024-04-09 DIAGNOSIS — Z125 Encounter for screening for malignant neoplasm of prostate: Secondary | ICD-10-CM

## 2024-04-09 DIAGNOSIS — Z7984 Long term (current) use of oral hypoglycemic drugs: Secondary | ICD-10-CM

## 2024-04-09 DIAGNOSIS — Z0000 Encounter for general adult medical examination without abnormal findings: Secondary | ICD-10-CM

## 2024-04-09 LAB — LIPID PANEL
Cholesterol: 182 mg/dL (ref 0–200)
HDL: 37.4 mg/dL — ABNORMAL LOW (ref >39.00)
LDL Cholesterol: 110 mg/dL — ABNORMAL HIGH (ref 0–99)
NonHDL: 144.28
Total CHOL/HDL Ratio: 5
Triglycerides: 173 mg/dL — ABNORMAL HIGH (ref 0.0–149.0)
VLDL: 34.6 mg/dL (ref 0.0–40.0)

## 2024-04-09 LAB — COMPREHENSIVE METABOLIC PANEL WITH GFR
ALT: 27 U/L (ref 0–53)
AST: 23 U/L (ref 0–37)
Albumin: 4.6 g/dL (ref 3.5–5.2)
Alkaline Phosphatase: 46 U/L (ref 39–117)
BUN: 21 mg/dL (ref 6–23)
CO2: 30 meq/L (ref 19–32)
Calcium: 10.1 mg/dL (ref 8.4–10.5)
Chloride: 102 meq/L (ref 96–112)
Creatinine, Ser: 1.53 mg/dL — ABNORMAL HIGH (ref 0.40–1.50)
GFR: 44.21 mL/min — ABNORMAL LOW (ref >60.00)
Glucose, Bld: 92 mg/dL (ref 70–99)
Potassium: 4.5 meq/L (ref 3.5–5.1)
Sodium: 139 meq/L (ref 135–145)
Total Bilirubin: 0.4 mg/dL (ref 0.2–1.2)
Total Protein: 7.7 g/dL (ref 6.0–8.3)

## 2024-04-09 LAB — CBC
HCT: 41.5 % (ref 39.0–52.0)
Hemoglobin: 13.5 g/dL (ref 13.0–17.0)
MCHC: 32.4 g/dL (ref 30.0–36.0)
MCV: 73.6 fl — ABNORMAL LOW (ref 78.0–100.0)
Platelets: 226 K/uL (ref 150.0–400.0)
RBC: 5.65 Mil/uL (ref 4.22–5.81)
RDW: 15.5 % (ref 11.5–15.5)
WBC: 6.8 K/uL (ref 4.0–10.5)

## 2024-04-09 LAB — HEMOGLOBIN A1C: Hgb A1c MFr Bld: 6.2 % (ref 4.6–6.5)

## 2024-04-09 LAB — MICROALBUMIN / CREATININE URINE RATIO
Creatinine,U: 196.8 mg/dL
Microalb Creat Ratio: 3.7 mg/g (ref 0.0–30.0)
Microalb, Ur: 0.7 mg/dL (ref 0.0–1.9)

## 2024-04-09 LAB — PSA: PSA: 0.57 ng/mL (ref 0.10–4.00)

## 2024-04-09 LAB — TSH: TSH: 1.24 u[IU]/mL (ref 0.35–5.50)

## 2024-04-09 MED ORDER — BLOOD GLUCOSE TEST VI STRP: 1.0000 | ORAL_STRIP | 6 refills | Status: AC

## 2024-04-09 MED ORDER — LANCETS MISC: 1.0000 | 6 refills | Status: AC

## 2024-04-09 MED ORDER — EZETIMIBE 10 MG PO TABS: 10.0000 mg | ORAL_TABLET | Freq: Every day | ORAL | 3 refills | Status: AC

## 2024-04-09 MED ORDER — BLOOD GLUCOSE MONITORING SUPPL DEVI: 1.0000 | 0 refills | Status: AC

## 2024-04-09 NOTE — Progress Notes (Signed)
 Subjective:    Patient ID: Kary Colaizzi, male    DOB: [birth year], 73 y.o.   MRN: [id number]  HPI Patient presents for yearly preventative medicine examination. He is a 73 year old male who  has a past medical history of Diabetes mellitus without complication (HCC), Hyperlipidemia, Hypertension, and Personal history of colonic polyps (03/08/2001).  He has primary care at the [facility] as well.   Hypertension -managed with Benicar  HCT 20-12.5mg .  His blood pressures have been well-controlled in the past.  He denies dizziness, lightheadedness, chest pain, shortness of breath BP Readings from Last 3 Encounters:  04/04/24 130/82  04/02/24 (!) 140/80  01/30/24 100/60   Hyperlipidemia- He has a history of statin intolerance with myalgia. Not currently on medication.  Lab Results  Component Value Date   CHOL 158 01/11/2023   HDL 37.50 (L) 01/11/2023   LDLCALC 96 01/11/2023   TRIG 125.0 01/11/2023   CHOLHDL 4 01/11/2023   DM Type 2  - managed with metformin  500 mg BID.  Lab Results  Component Value Date   HGBA1C 6.3 01/11/2023   HGBA1C 6.5 12/08/2021   HGBA1C 6.4 11/03/2020    GERD- takes Prilosec 20 mg daily- feels controlled.    All immunizations and health maintenance protocols were reviewed with the patient and needed orders were placed. He is up to date on all vaccinations.   Appropriate screening laboratory values were ordered for the patient including screening of hyperlipidemia, renal function and hepatic function. If indicated by BPH, a PSA was ordered.  Medication reconciliation,  past medical history, social history, problem list and allergies were reviewed in detail with the patient  Goals were established with regard to weight loss, exercise, and  diet in compliance with medications Wt Readings from Last 3 Encounters:  04/09/24 213 lb (96.6 kg)  04/04/24 215 lb (97.5 kg)  04/02/24 215 lb (97.5 kg)     He is up to date on routine colon cancer screening   Review of  Systems  Constitutional: Negative.   HENT: Negative.    Eyes: Negative.   Respiratory: Negative.    Cardiovascular: Negative.   Gastrointestinal: Negative.   Endocrine: Negative.   Genitourinary: Negative.   Musculoskeletal: Negative.   Skin: Negative.   Allergic/Immunologic: Negative.   Neurological: Negative.   Hematological: Negative.   Psychiatric/Behavioral: Negative.    All other systems reviewed and are negative.  Past Medical History:  Diagnosis Date   Diabetes mellitus without complication (HCC)    Hyperlipidemia    Hypertension    Personal history of colonic polyps 03/08/2001    Social History   Socioeconomic History   Marital status: Married    Spouse name: Not on file   Number of children: Not on file   Years of education: Not on file   Highest education level: Not on file  Occupational History   Not on file  Tobacco Use   Smoking status: Never    Passive exposure: Past (mother smoked in home when a child)   Smokeless tobacco: Never  Vaping Use   Vaping status: Never Used  Substance and Sexual Activity   Alcohol use: Yes    Comment: once every 3 months   Drug use: No   Sexual activity: Not on file  Other Topics Concern   Not on file  Social History Narrative   Retired from working at the post office    Married    Daughter and step son   Three grand children  He likes to go to the beach and spending time with his wife.       Social Drivers of Corporate Investment Banker Strain: Low Risk  (01/02/2024)   Overall Financial Resource Strain (CARDIA)    Difficulty of Paying Living Expenses: Not hard at all  Food Insecurity: No Food Insecurity (01/02/2024)   Hunger Vital Sign    Worried About Running Out of Food in the Last Year: Never true    Ran Out of Food in the Last Year: Never true  Transportation Needs: No Transportation Needs (01/02/2024)   PRAPARE - Administrator, Civil Service (Medical): No    Lack of Transportation  (Non-Medical): No  Physical Activity: Insufficiently Active (01/02/2024)   Exercise Vital Sign    Days of Exercise per Week: 3 days    Minutes of Exercise per Session: 30 min  Stress: No Stress Concern Present (01/02/2024)   [employer] of Occupational Health - Occupational Stress Questionnaire    Feeling of Stress: Not at all  Social Connections: Socially Integrated (01/02/2024)   Social Connection and Isolation Panel    Frequency of Communication with Friends and Family: More than three times a week    Frequency of Social Gatherings with Friends and Family: More than three times a week    Attends Religious Services: More than 4 times per year    Active Member of [employer] or Organizations: Yes    Attends Engineer, Structural: More than 4 times per year    Marital Status: Married  Catering Manager Violence: Not At Risk (01/02/2024)   Humiliation, Afraid, Rape, and Kick questionnaire    Fear of Current or Ex-Partner: No    Emotionally Abused: No    Physically Abused: No    Sexually Abused: No    Past Surgical History:  Procedure Laterality Date   COLONOSCOPY W/ POLYPECTOMY      Family History  Problem Relation Age of Onset   Lung cancer Mother        lung   Prostate cancer Father    Cancer Maternal Grandfather        colon   Colon cancer Maternal Grandfather    Coronary artery disease Neg Hx    Colon polyps Neg Hx    Rectal cancer Neg Hx    Stomach cancer Neg Hx    Esophageal cancer Neg Hx     Allergies  Allergen Reactions   Crestor  [Rosuvastatin ]     Muscle cramps    Simvastatin      Other reaction(s): Muscle pain    Current Outpatient Medications on File Prior to Visit  Medication Sig Dispense Refill   albuterol  (VENTOLIN  HFA) 108 (90 Base) MCG/ACT inhaler Inhale 2 puffs into the lungs every 6 (six) hours as needed for wheezing or shortness of breath. 8 g 1   bimatoprost (LUMIGAN) 0.03 % ophthalmic solution Place 1 drop into both eyes at bedtime.      latanoprost (XALATAN) 0.005 % ophthalmic solution Apply to eye.     metFORMIN  (GLUCOPHAGE ) 500 MG tablet TAKE 1 TABLET BY MOUTH TWICE DAILY WITH A MEAL 180 tablet 0   Multiple Vitamins-Minerals (MULTIVITAMIN ADULTS 50+ PO) Take by mouth.     olmesartan -hydrochlorothiazide  (BENICAR  HCT) 20-12.5 MG tablet Take 1 tablet by mouth once daily 90 tablet 0   omeprazole  (PRILOSEC) 20 MG capsule TAKE 1 CAPSULE BY MOUTH ONCE DAILY AS NEEDED 90 capsule 0   sildenafil  (REVATIO ) 20 MG tablet TAKE 2 TO  5 TABLETS BY MOUTH AS NEEDED 20 tablet 0   No current facility-administered medications on file prior to visit.    BP 110/80   Pulse 84   Temp 98.8 F (37.1 C) (Oral)   Ht 5' 11 (1.803 m)   Wt 213 lb (96.6 kg)   SpO2 94%   BMI 29.71 kg/m       Objective:   Physical Exam Vitals and nursing note reviewed.  Constitutional:      General: He is not in acute distress.    Appearance: Normal appearance. He is obese. He is not ill-appearing.  HENT:     Head: Normocephalic and atraumatic.     Right Ear: Tympanic membrane, ear canal and external ear normal. There is no impacted cerumen.     Left Ear: Tympanic membrane, ear canal and external ear normal. There is no impacted cerumen.     Nose: Nose normal. No congestion or rhinorrhea.     Mouth/Throat:     Mouth: Mucous membranes are moist.     Pharynx: Oropharynx is clear.  Eyes:     Extraocular Movements: Extraocular movements intact.     Conjunctiva/sclera: Conjunctivae normal.     Pupils: Pupils are equal, round, and reactive to light.  Neck:     Vascular: No carotid bruit.  Cardiovascular:     Rate and Rhythm: Normal rate and regular rhythm.     Pulses: Normal pulses.     Heart sounds: No murmur heard.    No friction rub. No gallop.  Pulmonary:     Effort: Pulmonary effort is normal.     Breath sounds: Normal breath sounds.  Abdominal:     General: Abdomen is flat. Bowel sounds are normal. There is no distension.     Palpations: Abdomen  is soft. There is no mass.     Tenderness: There is no abdominal tenderness. There is no guarding or rebound.     Hernia: No hernia is present.  Musculoskeletal:        General: Normal range of motion.     Cervical back: Normal range of motion and neck supple.  Lymphadenopathy:     Cervical: No cervical adenopathy.  Skin:    General: Skin is warm and dry.     Capillary Refill: Capillary refill takes less than 2 seconds.  Neurological:     General: No focal deficit present.     Mental Status: He is alert and oriented to person, place, and time.  Psychiatric:        Mood and Affect: Mood normal.        Behavior: Behavior normal.        Thought Content: Thought content normal.        Judgment: Judgment normal.       Assessment & Plan:  1. Routine general medical examination at a health care facility (Primary) Today patient counseled on age appropriate routine health concerns for screening and prevention, each reviewed and up to date or declined. Immunizations reviewed and up to date or declined. Labs ordered and reviewed. Risk factors for depression reviewed and negative. Hearing function and visual acuity are intact. ADLs screened and addressed as needed. Functional ability and level of safety reviewed and appropriate. Education, counseling and referrals performed based on assessed risks today. Patient provided with a copy of personalized plan for preventive services. - Work on weight loss through diet and exercise - Follow up in one year or sooner if needed  2. Essential hypertension -  Controlled. No change in medication  - Lipid panel; Future - TSH; Future - CBC; Future - Comprehensive metabolic panel with GFR; Future  3. Hyperlipidemia, unspecified hyperlipidemia type - Consider Zetia  - Lipid panel; Future - TSH; Future - CBC; Future - Comprehensive metabolic panel with GFR; Future  4. Statin myopathy  - Lipid panel; Future - TSH; Future - CBC; Future - Comprehensive  metabolic panel with GFR; Future  5. Diabetes mellitus treated with oral medication (HCC) - Consider increase in Metformin   - Likely six month follow up  - Lipid panel; Future - TSH; Future - CBC; Future - Comprehensive metabolic panel with GFR; Future - Hemoglobin A1c; Future - Microalbumin/Creatinine Ratio, Urine; Future - Blood Glucose Monitoring Suppl DEVI; 1 each by Does not apply route as directed. Dispense based on patient and insurance preference. Use up to four times daily as directed. (FOR ICD-10 E10.9, E11.9).  Dispense: 1 each; Refill: 0 - Glucose Blood (BLOOD GLUCOSE TEST STRIPS) STRP; 1 each by Does not apply route as directed. Dispense based on patient and insurance preference. Use up to four times daily as directed. (FOR ICD-10 E10.9, E11.9).  Dispense: 100 strip; Refill: 6 - Lancets MISC; 1 each by Does not apply route as directed. Dispense based on patient and insurance preference. Use up to four times daily as directed. (FOR ICD-10 E10.9, E11.9).  Dispense: 100 each; Refill: 6  6. Gastroesophageal reflux disease with esophagitis without hemorrhage - Continue PPI  - Lipid panel; Future - TSH; Future - CBC; Future - Comprehensive metabolic panel with GFR; Future  7. Prostate cancer screening  - PSA; Future   Darleene Shape, NP

## 2024-05-12 ENCOUNTER — Other Ambulatory Visit: Payer: Self-pay | Admitting: Adult Health

## 2024-05-12 DIAGNOSIS — E119 Type 2 diabetes mellitus without complications: Secondary | ICD-10-CM

## 2025-01-07 ENCOUNTER — Ambulatory Visit
